# Patient Record
Sex: Male | Born: 1945
Health system: Southern US, Community
[De-identification: ages and names within clinical notes are randomized; demographics above are authoritative.]

## PROBLEM LIST (undated history)

## (undated) DIAGNOSIS — I10 Essential (primary) hypertension: Secondary | ICD-10-CM

## (undated) DIAGNOSIS — C801 Malignant (primary) neoplasm, unspecified: Secondary | ICD-10-CM

## (undated) DIAGNOSIS — K635 Polyp of colon: Secondary | ICD-10-CM

## (undated) DIAGNOSIS — D649 Anemia, unspecified: Secondary | ICD-10-CM

## (undated) DIAGNOSIS — I480 Paroxysmal atrial fibrillation: Secondary | ICD-10-CM

## (undated) DIAGNOSIS — T7840XA Allergy, unspecified, initial encounter: Secondary | ICD-10-CM

## (undated) DIAGNOSIS — M1990 Unspecified osteoarthritis, unspecified site: Secondary | ICD-10-CM

## (undated) DIAGNOSIS — N3289 Other specified disorders of bladder: Secondary | ICD-10-CM

## (undated) DIAGNOSIS — C4490 Unspecified malignant neoplasm of skin, unspecified: Secondary | ICD-10-CM

## (undated) DIAGNOSIS — J309 Allergic rhinitis, unspecified: Secondary | ICD-10-CM

## (undated) DIAGNOSIS — R3915 Urgency of urination: Secondary | ICD-10-CM

## (undated) DIAGNOSIS — H269 Unspecified cataract: Secondary | ICD-10-CM

## (undated) DIAGNOSIS — G43909 Migraine, unspecified, not intractable, without status migrainosus: Secondary | ICD-10-CM

## (undated) DIAGNOSIS — Z87442 Personal history of urinary calculi: Secondary | ICD-10-CM

## (undated) HISTORY — PX: INGUINAL HERNIA REPAIR: SUR1180

## (undated) HISTORY — DX: Unspecified malignant neoplasm of skin, unspecified: C44.90

## (undated) HISTORY — PX: CYSTOSCOPY: SUR368

## (undated) HISTORY — DX: Polyp of colon: K63.5

## (undated) HISTORY — DX: Allergy, unspecified, initial encounter: T78.40XA

## (undated) HISTORY — DX: Unspecified cataract: H26.9

## (undated) HISTORY — PX: COLONOSCOPY: SHX174

## (undated) HISTORY — PX: OTHER SURGICAL HISTORY: SHX169

## (undated) HISTORY — PX: FINGER SURGERY: SHX640

## (undated) HISTORY — DX: Essential (primary) hypertension: I10

## (undated) HISTORY — PX: APPENDECTOMY: SHX54

## (undated) HISTORY — DX: Allergic rhinitis, unspecified: J30.9

## (undated) HISTORY — DX: Paroxysmal atrial fibrillation: I48.0

## (undated) HISTORY — DX: Migraine, unspecified, not intractable, without status migrainosus: G43.909

---

## 1997-11-03 ENCOUNTER — Ambulatory Visit (HOSPITAL_COMMUNITY): Admission: RE | Admit: 1997-11-03 | Discharge: 1997-11-03 | Payer: Self-pay | Admitting: Gastroenterology

## 2000-06-11 ENCOUNTER — Emergency Department (HOSPITAL_COMMUNITY): Admission: EM | Admit: 2000-06-11 | Discharge: 2000-06-11 | Payer: Self-pay | Admitting: Emergency Medicine

## 2000-06-13 ENCOUNTER — Encounter: Payer: Self-pay | Admitting: Urology

## 2000-06-14 ENCOUNTER — Ambulatory Visit (HOSPITAL_COMMUNITY): Admission: RE | Admit: 2000-06-14 | Discharge: 2000-06-14 | Payer: Self-pay | Admitting: Urology

## 2000-06-14 ENCOUNTER — Encounter: Payer: Self-pay | Admitting: Urology

## 2001-05-23 ENCOUNTER — Other Ambulatory Visit: Admission: RE | Admit: 2001-05-23 | Discharge: 2001-05-23 | Payer: Self-pay | Admitting: Family Medicine

## 2001-06-27 ENCOUNTER — Ambulatory Visit (HOSPITAL_COMMUNITY): Admission: RE | Admit: 2001-06-27 | Discharge: 2001-06-27 | Payer: Self-pay | Admitting: General Surgery

## 2001-11-06 ENCOUNTER — Observation Stay (HOSPITAL_COMMUNITY): Admission: RE | Admit: 2001-11-06 | Discharge: 2001-11-07 | Payer: Self-pay | Admitting: General Surgery

## 2002-01-11 ENCOUNTER — Encounter: Payer: Self-pay | Admitting: Emergency Medicine

## 2002-01-11 ENCOUNTER — Inpatient Hospital Stay (HOSPITAL_COMMUNITY): Admission: EM | Admit: 2002-01-11 | Discharge: 2002-01-12 | Payer: Self-pay | Admitting: Emergency Medicine

## 2008-10-22 ENCOUNTER — Ambulatory Visit (HOSPITAL_COMMUNITY): Admission: RE | Admit: 2008-10-22 | Discharge: 2008-10-22 | Payer: Self-pay | Admitting: Internal Medicine

## 2008-10-22 ENCOUNTER — Encounter (INDEPENDENT_AMBULATORY_CARE_PROVIDER_SITE_OTHER): Payer: Self-pay | Admitting: Internal Medicine

## 2010-02-15 ENCOUNTER — Ambulatory Visit (HOSPITAL_BASED_OUTPATIENT_CLINIC_OR_DEPARTMENT_OTHER): Admission: RE | Admit: 2010-02-15 | Discharge: 2010-02-15 | Payer: Self-pay | Admitting: Urology

## 2010-06-21 LAB — POCT I-STAT 4, (NA,K, GLUC, HGB,HCT)
Glucose, Bld: 108 mg/dL — ABNORMAL HIGH (ref 70–99)
HCT: 43 % (ref 39.0–52.0)
Hemoglobin: 14.6 g/dL (ref 13.0–17.0)
Potassium: 4.3 mEq/L (ref 3.5–5.1)
Sodium: 139 mEq/L (ref 135–145)

## 2010-06-23 ENCOUNTER — Ambulatory Visit (HOSPITAL_COMMUNITY)
Admission: RE | Admit: 2010-06-23 | Discharge: 2010-06-23 | Disposition: A | Discharge status: Home / Self Care | Payer: 59 | Source: Ambulatory Visit | Attending: Urology | Admitting: Urology

## 2010-06-23 DIAGNOSIS — I10 Essential (primary) hypertension: Secondary | ICD-10-CM | POA: Insufficient documentation

## 2010-06-23 DIAGNOSIS — N200 Calculus of kidney: Secondary | ICD-10-CM | POA: Insufficient documentation

## 2010-06-23 LAB — BASIC METABOLIC PANEL
BUN: 12 mg/dL (ref 6–23)
CO2: 25 mEq/L (ref 19–32)
Chloride: 103 mEq/L (ref 96–112)
Creatinine, Ser: 0.88 mg/dL (ref 0.4–1.5)
Glucose, Bld: 116 mg/dL — ABNORMAL HIGH (ref 70–99)

## 2010-06-23 LAB — CBC
HCT: 44.8 % (ref 39.0–52.0)
MCH: 27.1 pg (ref 26.0–34.0)
MCV: 81.9 fL (ref 78.0–100.0)
Platelets: 215 10*3/uL (ref 150–400)
RBC: 5.47 MIL/uL (ref 4.22–5.81)

## 2010-08-23 NOTE — Op Note (Signed)
NAME:  MOUHAMAD, TEED NO.:  [id number]   MEDICAL RECORD NO.:  [id number]          PATIENT TYPE:  AMB   LOCATION:  DAY                           FACILITY:  [facility]   PHYSICIAN:  Lionel December, M.D.    DATE OF BIRTH:  01/24/1946   DATE OF PROCEDURE:  DATE OF DISCHARGE:  10/22/2008                               OPERATIVE REPORT   PROCEDURE:  Colonoscopy with polypectomy.   INDICATION:  Dorene Sorrow is a 65 year old Caucasian male with history of  colonic polyps whose last exam was in 2003 and was normal, but his first  exam revealed an adenoma or adenomas.  These studies were done by Dr.  Lovell Sheehan.  Family history is negative for colorectal carcinoma.  He is  undergoing surveillance colonoscopy.  Procedure risks were reviewed with  the patient, and informed consent was obtained.   MEDICATIONS FOR CONSCIOUS SEDATION:  Demerol 50 mg IV, Versed 5 mg IV.   FINDINGS:  Procedure performed in endoscopy suite.  The patient's vital  signs and O2 sat were monitored during the procedure and remained  stable.  The patient was placed in left lateral recumbent position and  rectal examination performed.  No abnormality noted on external or  digital exam.  Pentax videoscope was placed through the rectum and  advanced under vision into sigmoid colon and beyond.  Preparation was  satisfactory.  Few scattered diverticula were noted in the sigmoid  colon.  Scope was passed into cecum, which was identified by appendiceal  orifice and ileocecal valve.  Pictures were taken for the record.  As  the scope was gradually withdrawn, colonic mucosa was carefully  examined.  Two Jessie polyps were ablated via cold biopsy, one from  ascending colon and the second one was from proximal sigmoid colon,  another 6-mm flat polyp at splenic flexure was elevated with some  submucosal injection of saline and snared.  Both of the polyps were  coagulated, but part of it was retrieved for histologic examination.  There was another 5-mm polyp at midsigmoid colon which was snared and  retrieved for histologic examination.  Rectal mucosa was normal.  Scope  was retroflexed to examine anorectal junction and hemorrhoids were noted  proximal to the dentate line.  Endoscope was straightened and withdrawn.  The patient tolerated the procedure well.   Withdrawal time was 21 minutes.   FINAL DIAGNOSES:  1. Examination performed to cecum.  2. Few diverticula at sigmoid colon.  3. Two Wolk polyps ablated via cold biopsy, one from ascending colon,      second one from splenic flexure.  4. Two polyps were snared as above, flat polyp at splenic flexure was      about 6-mm and the other one was 5-mm.  5. Internal hemorrhoids.   RECOMMENDATIONS:  1. High-fiber diet.  2. No aspirin for 10 days.   I will be contacting the patient with the results of biopsy and further  recommendations.      Lionel December, M.D.  Electronically Signed     NR/MEDQ  D:  10/22/2008  T:  10/23/2008  Job:  [id number]   cc:   Donna Bernard, M.D.  Fax: [phone]

## 2010-08-26 NOTE — Op Note (Signed)
   NAME:  Joshua Alvarez, Joshua Alvarez                         ACCOUNT NO.:  [id number]   MEDICAL RECORD NO.:  [id number]                   PATIENT TYPE:  INP   LOCATION:  5019                                 FACILITY:  [facility]   PHYSICIAN:  Artist Pais. Mina Marble, M.D.           DATE OF BIRTH:  11-01-1945   DATE OF PROCEDURE:  01/11/2002  DATE OF DISCHARGE:  01/12/2002                                 OPERATIVE REPORT   PREOPERATIVE DIAGNOSIS:  Left ring finger avulsion injury.   POSTOPERATIVE DIAGNOSIS:  Left ring finger avulsion injury.   PROCEDURE:  Microscopic repair, two dorsal veins left ring finger.   SURGEON:  Artist Pais. Mina Marble, M.D.   ASSISTANT:  None.   ANESTHESIA:  General.   No tourniquet, no complications, and no drains.   DESCRIPTION OF PROCEDURE:  The patient was taken to the operating room and  after the induction of adequate general anesthesia, the left upper extremity  was prepped and draped in the usual sterile fashion.  Once this was done,  the left ring finger was inspected.  There was an obvious avulsion injury to  the area over the base of the proximal phalanx.  The initial operative  intervention included bathing the wound in a liter of warm normal saline.  Once this was done, the volar aspect was inspected.  The neurovascular  bundles were intact.  There was good blood flow into the digit with bleeding  from pinprick sites distal and proximal to the avulsion.  After the arterial  inflow had been established, following examination, the hand was fully  pronated and the microscope was brought into the field.  Two dorsal veins  were repaired using 10-0 nylon.  Once this was done, the wound was again  thoroughly irrigated.  There appeared to be good flow in and out of the  finger.  There was some veins in the radial and ulnar side that appeared to  be intact and again after this was done, it was irrigated with warm solution  and the avulsion skin was loosely closed  circumferentially using 5-0 nylon  in simple sutures spaced broadly apart.  The patient was then placed in a  sterile dressing, Xeroform, 4x4s, fluffs, and a dorsal extension block  splint with slight flexion to the wrist and fingers.  The patient had a good  capillary refill postoperatively and was taken to the PACU in stable  condition.                                               Artist Pais Mina Marble, M.D.    MAW/MEDQ  D:  01/11/2002  T:  01/13/2002  Job:  [id number]

## 2010-08-26 NOTE — Op Note (Signed)
NAME:  Joshua Alvarez, Joshua Alvarez NO.:  [id number]   MEDICAL RECORD NO.:  [id number]                   PATIENT TYPE:  AMB   LOCATION:  DAY                                  FACILITY:  [facility]   PHYSICIAN:  Marlane Hatcher, M.D.           DATE OF BIRTH:  1945-11-03   DATE OF PROCEDURE:  11/06/2001  DATE OF DISCHARGE:                                 OPERATIVE REPORT   PREOPERATIVE DIAGNOSIS:  Left inguinal hernia.   POSTOPERATIVE DIAGNOSIS:  Left inguinal hernia, direct and indirect.   PROCEDURE:  Left inguinal herniorrhaphy with mesh plug and patch repair.   SURGEON:  Marlane Hatcher, M.D.   INDICATIONS FOR PROCEDURE:  This is a 65 year old white male who had a  reducible right groin mass for at least five years.  This was clinically  consistent with a left inguinal hernia.  We had made plans for an elective  repair, discussing the possible use of mesh and discussing complications not  limited to, but including, bleeding, infection, and recurrence.  Informed  consent was obtained.   GROSS FINDINGS:  The patient had considerable amount of scar tissue around  the large indirect inguinal hernia sac.  We performed a high ligation of the  sac as well as a repair of the inguinal floor as the inguinal floor itself  was weak.   TECHNIQUE:  The patient was placed in the supine position after the adequate  administration of general anesthesia via LMA anesthesia.  The patient's  abdomen was prepped with Betadine solution, draped in the usual manner.  Prior to this, a Foley catheter was aseptically inserted.  An incision was  carried out between the anterior superior iliac spine on the left and the  pubic tubercle through skin, subcutaneous tissue, and Scarpa's layer down to  the external oblique, which was open through the external ring.  The cord  structures were dissected free from the indirect inguinal hernia sac which  was open under direct vision and doubly  ligated with 2-0 Bralon suture.  The  redundant portion of the sac was amputated.  I then sutured to the sac a  remnant, a medium-sized plug and this was sutured peripherally to the fascia  of the transversalis abdominus and the transverses abdominus fascia in order  to hold this in place.  I then laid a plug underneath the cord structures  and this was cut to accommodate the inguinal floor.  This was likewise  tacked to the transversalis abdominis and the transversus fascias as well as  Poupart's ligament with interrupted 2-0 Novofil sutures.  The cord  structures were then returned to their anatomic position.  The wound was  irrigated with bacitracin and saline solution which had been the same  solution that we had used to soak the mesh prosthesis preoperatively.  We  then irrigated and the closed the subcutaneous layer with 2-0 Polysorb and  the skin with  a stapling device.  Prior to closure, all sponge, instrument,  and needle counts found to be correct.  Estimated blood loss was minimal.  The patient received 1500 cc of crystalloid intraoperatively.  There were no  complications.   ADDENDUM:  We did use approximately 6 cc of 0.5% Cetacaine to locally  infiltrate the area to add with postoperative comfort.                                                   Marlane Hatcher, M.D.    WSB/MEDQ  D:  11/06/2001  T:  11/09/2001  Job:  [id number]   cc:   Lubertha South, M.D.

## 2010-08-26 NOTE — Op Note (Signed)
[place]. [facility]  Patient:    Joshua Alvarez, Joshua Alvarez                      MRN: [id number] Proc. Date: 06/14/00 Adm. Date:  [id number] Attending:  Monica Becton CC:         Cecil Cranker, M.D. [facility]   Operative Report  PREOPERATIVE DIAGNOSIS:  Large obstructing stone in the mid left ureter.  POSTOPERATIVE DIAGNOSIS:  Large obstructing stone in the mid left ureter.  PROCEDURE:  Cystoscopy, left retrograde pyeloureterogram and insertion of a 6 French 26 cm double-J stent.  BRIEF HISTORY:  This is a 65 year old man who presented with left-sided colicky pain over the weekend, and CT scan showed about 1 cm stone at the junction of the mid and distal ureter right over the sacrum.  We saw him in the office a couple of days ago and we could see the stone overlying the sacrum only on the oblique view.  We went over treatment options including an attempt at ESWL versus ureteroscopy.  A decision was made to go ahead with a ESWL and I thought that we ought put in a stent ahead of time for localization purposes.  The patient agrees to the proposed surgery.  PROCEDURE:  The patient was prepped and draped in the dorsal lithotomy position under LMA anesthesia. The cystoscopy was performed with a 22 French cystoscope.  There was no urethral stricture.  He had a [place] nonobstructing prostate, a wide open bladder neck.  The bladder itself looked normal.  No tumors, no calculi.  Normal ureteral orifices.  Initially I passed up a 0.038 removal coil guidewire through a 6 [place] opened ended ureteral catheter with C-arm control.  Retrograde studies were performed showing a large left hydroureteral nephrosis.  We then passed up the guidewire and past the stone without much difficulty. It was positioned in the left renal pelvis.  I backed out the 6 french opened ended catheter and I passed up a 6 french 26 cm bard inlay catheter without much difficulty.  It was positioned  in the left renal pelvis.  The distal end was curled up in the bladder. The guidewire was removed.  The bladder was emptied.  I injected 10 cc of Xylocaine jelly up the urethra and he was taken back to the recovery room in satisfactory condition.  The plan now is to perform ESWL later on in the morning. DD:  06/14/00 TD:  06/14/00 Job: [id number]

## 2010-08-26 NOTE — Consult Note (Signed)
NAME:  Joshua Alvarez, Joshua Alvarez                         ACCOUNT NO.:  [id number]   MEDICAL RECORD NO.:  [id number]                   PATIENT TYPE:  EMS   LOCATION:  ED                                   FACILITY:  [facility]   PHYSICIAN:  Artist Pais. Mina Marble, M.D.           DATE OF BIRTH:  1945/12/22   DATE OF CONSULTATION:  01/11/2002  DATE OF DISCHARGE:  01/11/2002                                   CONSULTATION   REASON FOR CONSULTATION:  Joshua Alvarez is a very pleasant, 65 year old, right-  hand-dominant male, who fell off a ladder earlier today and sustained an  evulsion-type injury to his left ring finger, where his wedding band got  caught on one of the rungs.  He was initially seen at [facility] by  Dr. Margarita Grizzle, and transferred here for definitive care.  He is an  otherwise healthy 65 year old male with no known drug allergies.  He is  currently taking atenolol, Lasix, and Altace.   PAST MEDICAL HISTORY:  He had a hernia repair two months ago and no recent  hospitalizations or surgery.   FAMILY HISTORY:  Otherwise noncontributory.   SOCIAL HISTORY:  Noncontributory.   PHYSICAL EXAMINATION:  GENERAL:  Well-developed, well-nourished male,  pleasant, alert and oriented x 3.  LEFT RING FINGER:  He has an obvious evulsion-type injury in the proximal  third of the proximal phalanx with what appears to be a too pink turgor and  venous congestion and decreased capillary refill.  The digit distal to the  ring evulsion is somewhat congested.  He is able to flex at the PIP and DIP,  although it is limited, and there is evidence of, again, venous congestion  and decreased inflow to the digit.  X-ray shows some fragments from his  ring.  No fracture is identified.   IMPRESSION:  A 65 year old male with what appears to be a significant  evulsion-type injury to his nondominant left ring finger.  I explained to  Joshua Alvarez and his wife that we needed to take him to the operating room  immediately, explore this with possible vein grafting if necessary versus  heparinization and dextran if the vessels are intact but in spasm.  We will  go ahead and look at this under the microscope and repair as necessary.  Also explained to both Joshua Alvarez and his wife the significant risk of the  finger being nonviable secondary to the evulsion and that certainly we would  try all attempts, but there is a significant risk of losing the finger  secondary to his injury.  They understand the risks and benefits and wish to  proceed with attempted revascularization, are going to be set up as soon as  possible, again for the previously-mentioned procedure.  Artist Pais Mina Marble, M.D.    MAW/MEDQ  D:  01/11/2002  T:  01/14/2002  Job:  [id number]

## 2011-08-30 DIAGNOSIS — J019 Acute sinusitis, unspecified: Secondary | ICD-10-CM | POA: Diagnosis not present

## 2011-08-30 DIAGNOSIS — B9789 Other viral agents as the cause of diseases classified elsewhere: Secondary | ICD-10-CM | POA: Diagnosis not present

## 2011-09-06 DIAGNOSIS — J019 Acute sinusitis, unspecified: Secondary | ICD-10-CM | POA: Diagnosis not present

## 2011-09-06 DIAGNOSIS — Z79899 Other long term (current) drug therapy: Secondary | ICD-10-CM | POA: Diagnosis not present

## 2011-09-06 DIAGNOSIS — J42 Unspecified chronic bronchitis: Secondary | ICD-10-CM | POA: Diagnosis not present

## 2011-09-06 DIAGNOSIS — J988 Other specified respiratory disorders: Secondary | ICD-10-CM | POA: Diagnosis not present

## 2011-09-06 DIAGNOSIS — E785 Hyperlipidemia, unspecified: Secondary | ICD-10-CM | POA: Diagnosis not present

## 2011-09-06 DIAGNOSIS — Z125 Encounter for screening for malignant neoplasm of prostate: Secondary | ICD-10-CM | POA: Diagnosis not present

## 2011-09-22 ENCOUNTER — Other Ambulatory Visit: Payer: Self-pay | Admitting: Family Medicine

## 2011-09-22 DIAGNOSIS — I714 Abdominal aortic aneurysm, without rupture: Secondary | ICD-10-CM

## 2011-09-22 DIAGNOSIS — Z0000 Encounter for general adult medical examination without abnormal findings: Secondary | ICD-10-CM | POA: Diagnosis not present

## 2011-09-22 DIAGNOSIS — Z1211 Encounter for screening for malignant neoplasm of colon: Secondary | ICD-10-CM | POA: Diagnosis not present

## 2011-09-28 ENCOUNTER — Ambulatory Visit (HOSPITAL_COMMUNITY)
Admission: RE | Admit: 2011-09-28 | Discharge: 2011-09-28 | Disposition: A | Discharge status: Home / Self Care | Payer: Medicare Other | Source: Ambulatory Visit | Attending: Family Medicine | Admitting: Family Medicine

## 2011-09-28 DIAGNOSIS — I714 Abdominal aortic aneurysm, without rupture, unspecified: Secondary | ICD-10-CM | POA: Insufficient documentation

## 2011-09-28 DIAGNOSIS — Z136 Encounter for screening for cardiovascular disorders: Secondary | ICD-10-CM | POA: Diagnosis not present

## 2011-09-28 DIAGNOSIS — I700 Atherosclerosis of aorta: Secondary | ICD-10-CM | POA: Diagnosis not present

## 2011-11-02 DIAGNOSIS — J42 Unspecified chronic bronchitis: Secondary | ICD-10-CM | POA: Diagnosis not present

## 2011-11-02 DIAGNOSIS — J019 Acute sinusitis, unspecified: Secondary | ICD-10-CM | POA: Diagnosis not present

## 2011-11-10 DIAGNOSIS — J42 Unspecified chronic bronchitis: Secondary | ICD-10-CM | POA: Diagnosis not present

## 2011-11-10 DIAGNOSIS — J019 Acute sinusitis, unspecified: Secondary | ICD-10-CM | POA: Diagnosis not present

## 2012-02-29 ENCOUNTER — Other Ambulatory Visit: Payer: Self-pay | Admitting: Dermatology

## 2012-02-29 DIAGNOSIS — D485 Neoplasm of uncertain behavior of skin: Secondary | ICD-10-CM | POA: Diagnosis not present

## 2012-02-29 DIAGNOSIS — D235 Other benign neoplasm of skin of trunk: Secondary | ICD-10-CM | POA: Diagnosis not present

## 2012-04-05 DIAGNOSIS — J0110 Acute frontal sinusitis, unspecified: Secondary | ICD-10-CM | POA: Diagnosis not present

## 2012-04-05 DIAGNOSIS — J209 Acute bronchitis, unspecified: Secondary | ICD-10-CM | POA: Diagnosis not present

## 2012-05-27 DIAGNOSIS — H2510 Age-related nuclear cataract, unspecified eye: Secondary | ICD-10-CM | POA: Diagnosis not present

## 2012-07-10 ENCOUNTER — Other Ambulatory Visit: Payer: Self-pay | Admitting: Dermatology

## 2012-07-10 DIAGNOSIS — L570 Actinic keratosis: Secondary | ICD-10-CM | POA: Diagnosis not present

## 2012-07-10 DIAGNOSIS — L821 Other seborrheic keratosis: Secondary | ICD-10-CM | POA: Diagnosis not present

## 2012-07-10 DIAGNOSIS — C44611 Basal cell carcinoma of skin of unspecified upper limb, including shoulder: Secondary | ICD-10-CM | POA: Diagnosis not present

## 2012-07-10 DIAGNOSIS — D485 Neoplasm of uncertain behavior of skin: Secondary | ICD-10-CM | POA: Diagnosis not present

## 2012-07-25 DIAGNOSIS — L679 Hair color and hair shaft abnormality, unspecified: Secondary | ICD-10-CM | POA: Diagnosis not present

## 2012-07-25 DIAGNOSIS — C44611 Basal cell carcinoma of skin of unspecified upper limb, including shoulder: Secondary | ICD-10-CM | POA: Diagnosis not present

## 2012-08-07 ENCOUNTER — Ambulatory Visit (INDEPENDENT_AMBULATORY_CARE_PROVIDER_SITE_OTHER): Payer: Medicare Other | Admitting: Family Medicine

## 2012-08-07 ENCOUNTER — Encounter: Payer: Self-pay | Admitting: Family Medicine

## 2012-08-07 VITALS — BP 119/72 | Temp 97.6°F | Ht 68.0 in | Wt 180.4 lb

## 2012-08-07 DIAGNOSIS — J322 Chronic ethmoidal sinusitis: Secondary | ICD-10-CM

## 2012-08-07 MED ORDER — HYDROCODONE-HOMATROPINE 5-1.5 MG/5ML PO SYRP: ORAL_SOLUTION | ORAL | Status: AC

## 2012-08-07 MED ORDER — AMOXICILLIN 500 MG PO TABS: ORAL_TABLET | ORAL | Status: DC

## 2012-08-07 NOTE — Progress Notes (Signed)
  Subjective:    Patient ID: Joshua Alvarez, male    DOB: Aug 16, 1945, 67 y.o.   MRN: [id number]  Cough This is a new problem. The current episode started in the past 7 days. The problem has been gradually worsening. The problem occurs every few minutes. The cough is non-productive. Associated symptoms include chills and headaches (frontal). Pertinent negatives include no fever. Nothing aggravates the symptoms. He has tried nothing for the symptoms. The treatment provided no relief.   Headache frontal in nature   Review of Systems  Constitutional: Positive for chills. Negative for fever.  Respiratory: Positive for cough.   Neurological: Positive for headaches (frontal).   No gi sympt otherwise neg    Objective:   Physical Exam  Alert mild malaise. Vitals reviewed. Frontal and nasal congestion. Pharynx erythematous. Neck supple. Lungs clear. Heart regular rate and rhythm.      Assessment & Plan:  Impression sinusitis. Plan antibiotics prescribed. Symptomatic care discussed.

## 2012-08-23 ENCOUNTER — Other Ambulatory Visit: Payer: Self-pay | Admitting: Family Medicine

## 2012-09-19 ENCOUNTER — Other Ambulatory Visit: Payer: Self-pay | Admitting: Family Medicine

## 2012-10-30 ENCOUNTER — Telehealth: Payer: Self-pay | Admitting: Family Medicine

## 2012-10-30 NOTE — Telephone Encounter (Signed)
Patient needs [facility] paperwork for Physical next month

## 2012-10-30 NOTE — Telephone Encounter (Signed)
Lip liv met psa 

## 2012-10-31 ENCOUNTER — Other Ambulatory Visit: Payer: Self-pay

## 2012-10-31 DIAGNOSIS — E782 Mixed hyperlipidemia: Secondary | ICD-10-CM

## 2012-10-31 DIAGNOSIS — Z79899 Other long term (current) drug therapy: Secondary | ICD-10-CM

## 2012-10-31 DIAGNOSIS — Z125 Encounter for screening for malignant neoplasm of prostate: Secondary | ICD-10-CM

## 2012-10-31 NOTE — Telephone Encounter (Signed)
Patient notified labwork is ready for pickup.

## 2012-11-05 ENCOUNTER — Other Ambulatory Visit: Payer: Self-pay

## 2012-11-05 MED ORDER — ENALAPRIL MALEATE 10 MG PO TABS: ORAL_TABLET | ORAL | Status: DC

## 2012-11-07 DIAGNOSIS — Z79899 Other long term (current) drug therapy: Secondary | ICD-10-CM | POA: Diagnosis not present

## 2012-11-07 DIAGNOSIS — Z125 Encounter for screening for malignant neoplasm of prostate: Secondary | ICD-10-CM | POA: Diagnosis not present

## 2012-11-07 DIAGNOSIS — E782 Mixed hyperlipidemia: Secondary | ICD-10-CM | POA: Diagnosis not present

## 2012-11-07 LAB — LIPID PANEL
LDL Cholesterol: 70 mg/dL (ref 0–99)
VLDL: 18 mg/dL (ref 0–40)

## 2012-11-07 LAB — BASIC METABOLIC PANEL
BUN: 11 mg/dL (ref 6–23)
Chloride: 104 mEq/L (ref 96–112)
Glucose, Bld: 107 mg/dL — ABNORMAL HIGH (ref 70–99)
Potassium: 5 mEq/L (ref 3.5–5.3)
Sodium: 138 mEq/L (ref 135–145)

## 2012-11-07 LAB — HEPATIC FUNCTION PANEL
Bilirubin, Direct: 0.1 mg/dL (ref 0.0–0.3)
Indirect Bilirubin: 0.5 mg/dL (ref 0.0–0.9)
Total Bilirubin: 0.6 mg/dL (ref 0.3–1.2)

## 2012-11-13 ENCOUNTER — Encounter: Payer: Self-pay | Admitting: *Deleted

## 2012-11-19 ENCOUNTER — Encounter: Payer: Self-pay | Admitting: Family Medicine

## 2012-11-19 ENCOUNTER — Ambulatory Visit (INDEPENDENT_AMBULATORY_CARE_PROVIDER_SITE_OTHER): Payer: Medicare Other | Admitting: Family Medicine

## 2012-11-19 VITALS — BP 126/84 | Ht 68.0 in | Wt 178.0 lb

## 2012-11-19 DIAGNOSIS — E785 Hyperlipidemia, unspecified: Secondary | ICD-10-CM | POA: Diagnosis not present

## 2012-11-19 DIAGNOSIS — Z0000 Encounter for general adult medical examination without abnormal findings: Secondary | ICD-10-CM | POA: Diagnosis not present

## 2012-11-19 DIAGNOSIS — I10 Essential (primary) hypertension: Secondary | ICD-10-CM | POA: Insufficient documentation

## 2012-11-19 DIAGNOSIS — N400 Enlarged prostate without lower urinary tract symptoms: Secondary | ICD-10-CM | POA: Insufficient documentation

## 2012-11-19 MED ORDER — SILDENAFIL CITRATE 50 MG PO TABS: ORAL_TABLET | ORAL | Status: DC

## 2012-11-19 NOTE — Progress Notes (Signed)
Subjective:    Patient ID: Joshua Alvarez, male    DOB: 09/30/1945, 67 y.o.   MRN: [id number]  HPI  Results for orders placed in visit on 10/31/12  LIPID PANEL      Result Value Range   Cholesterol 132  0 - 200 mg/dL   Triglycerides 88  <657 mg/dL   HDL 44  >84 mg/dL   Total CHOL/HDL Ratio 3.0     VLDL 18  0 - 40 mg/dL   LDL Cholesterol 70  0 - 99 mg/dL  HEPATIC FUNCTION PANEL      Result Value Range   Total Bilirubin 0.6  0.3 - 1.2 mg/dL   Bilirubin, Direct 0.1  0.0 - 0.3 mg/dL   Indirect Bilirubin 0.5  0.0 - 0.9 mg/dL   Alkaline Phosphatase 77  39 - 117 U/L   AST 19  0 - 37 U/L   ALT 19  0 - 53 U/L   Total Protein 6.5  6.0 - 8.3 g/dL   Albumin 4.2  3.5 - 5.2 g/dL  BASIC METABOLIC PANEL      Result Value Range   Sodium 138  135 - 145 mEq/L   Potassium 5.0  3.5 - 5.3 mEq/L   Chloride 104  96 - 112 mEq/L   CO2 29  19 - 32 mEq/L   Glucose, Bld 107 (*) 70 - 99 mg/dL   BUN 11  6 - 23 mg/dL   Creat 6.96  2.95 - 2.84 mg/dL   Calcium 9.3  8.4 - 13.2 mg/dL  PSA, MEDICARE      Result Value Range   PSA 2.94  <=4.00 ng/mL   Tired and fatigued. States and not depressed. Exercises very regularly.  Compliant with all of his medicines. Trying to watch his diet.  Wonders if his fatigue is related to the Lipitor.  Notes difficulty with erections lately. Worse over the past year. Would like to try something.  Review of Systems  Constitutional: Negative for fever, activity change and appetite change.       Positive for fatigue  HENT: Negative for congestion, rhinorrhea and neck pain.   Eyes: Negative for discharge.  Respiratory: Negative for cough and wheezing.   Cardiovascular: Negative for chest pain.  Gastrointestinal: Negative for vomiting, abdominal pain and blood in stool.  Genitourinary: Negative for frequency and difficulty urinating.       Progressive erectile dysfunction  Skin: Negative for rash.  Allergic/Immunologic: Negative for environmental allergies and food  allergies.  Neurological: Negative for weakness and headaches.  Psychiatric/Behavioral: Negative for agitation.       Objective:   Physical Exam  Vitals reviewed. Constitutional: He appears well-developed and well-nourished.  HENT:  Head: Normocephalic and atraumatic.  Right Ear: External ear normal.  Left Ear: External ear normal.  Nose: Nose normal.  Mouth/Throat: Oropharynx is clear and moist.  Eyes: EOM are normal. Pupils are equal, round, and reactive to light.  Neck: Normal range of motion. Neck supple. No thyromegaly present.  Cardiovascular: Normal rate, regular rhythm and normal heart sounds.   No murmur heard. Pulmonary/Chest: Effort normal and breath sounds normal. No respiratory distress. He has no wheezes.  Abdominal: Soft. Bowel sounds are normal. He exhibits no distension and no mass. There is no tenderness.  Genitourinary: Penis normal.  Musculoskeletal: Normal range of motion. He exhibits no edema.  Lymphadenopathy:    He has no cervical adenopathy.  Neurological: He is alert. He exhibits normal muscle tone.  Skin: Skin  is warm and dry. No erythema.  Psychiatric: He has a normal mood and affect. His behavior is normal. Judgment normal.          Assessment & Plan:  Impression #1 wellness exam #2 hypertension good control #3 erectile dysfunction discussed. 4 hyperlipidemia good control though question related to #5 #5 fatigue plan trial off Lipitor for the next month. Viagra 50 mg #6 appropriate use discussed. Otherwise check in 6 months. WSL

## 2012-11-19 NOTE — Patient Instructions (Signed)
Stop the lipitor for one month. If fatigue doesn't change, start back at full dose.  If fatigue improves, cut the dose in half and try that.  If fatigue does not change, also try hard to increase your exercise level

## 2012-12-06 ENCOUNTER — Other Ambulatory Visit: Payer: Self-pay | Admitting: Family Medicine

## 2012-12-17 ENCOUNTER — Other Ambulatory Visit: Payer: Self-pay | Admitting: Family Medicine

## 2013-01-08 ENCOUNTER — Other Ambulatory Visit: Payer: Self-pay | Admitting: Family Medicine

## 2013-02-25 ENCOUNTER — Other Ambulatory Visit: Payer: Self-pay | Admitting: Family Medicine

## 2013-04-25 ENCOUNTER — Telehealth: Payer: Self-pay | Admitting: Family Medicine

## 2013-04-25 ENCOUNTER — Other Ambulatory Visit: Payer: Self-pay | Admitting: Family Medicine

## 2013-04-25 ENCOUNTER — Other Ambulatory Visit: Payer: Self-pay | Admitting: *Deleted

## 2013-04-25 DIAGNOSIS — Z79899 Other long term (current) drug therapy: Secondary | ICD-10-CM

## 2013-04-25 DIAGNOSIS — I10 Essential (primary) hypertension: Secondary | ICD-10-CM

## 2013-04-25 DIAGNOSIS — E785 Hyperlipidemia, unspecified: Secondary | ICD-10-CM

## 2013-04-25 NOTE — Telephone Encounter (Signed)
Does patient needs blood work order for visit in February?

## 2013-04-25 NOTE — Telephone Encounter (Signed)
Had lipid, liver, bmp, psa on 11/07/12

## 2013-04-25 NOTE — Telephone Encounter (Signed)
Patient notified

## 2013-04-25 NOTE — Telephone Encounter (Signed)
Lip liv 

## 2013-05-06 DIAGNOSIS — R3915 Urgency of urination: Secondary | ICD-10-CM | POA: Diagnosis not present

## 2013-05-06 DIAGNOSIS — N200 Calculus of kidney: Secondary | ICD-10-CM | POA: Diagnosis not present

## 2013-05-06 DIAGNOSIS — N400 Enlarged prostate without lower urinary tract symptoms: Secondary | ICD-10-CM | POA: Diagnosis not present

## 2013-05-06 DIAGNOSIS — R350 Frequency of micturition: Secondary | ICD-10-CM | POA: Diagnosis not present

## 2013-05-13 DIAGNOSIS — E785 Hyperlipidemia, unspecified: Secondary | ICD-10-CM | POA: Diagnosis not present

## 2013-05-13 DIAGNOSIS — Z79899 Other long term (current) drug therapy: Secondary | ICD-10-CM | POA: Diagnosis not present

## 2013-05-13 LAB — HEPATIC FUNCTION PANEL
ALT: 18 U/L (ref 0–53)
AST: 17 U/L (ref 0–37)
Albumin: 4.3 g/dL (ref 3.5–5.2)
Alkaline Phosphatase: 78 U/L (ref 39–117)
BILIRUBIN DIRECT: 0.1 mg/dL (ref 0.0–0.3)
BILIRUBIN TOTAL: 0.6 mg/dL (ref 0.2–1.2)
Indirect Bilirubin: 0.5 mg/dL (ref 0.2–1.2)
Total Protein: 6.5 g/dL (ref 6.0–8.3)

## 2013-05-13 LAB — LIPID PANEL
CHOLESTEROL: 158 mg/dL (ref 0–200)
HDL: 43 mg/dL (ref >39)
LDL Cholesterol: 84 mg/dL (ref 0–99)
TRIGLYCERIDES: 157 mg/dL — AB (ref <150)
Total CHOL/HDL Ratio: 3.7 Ratio
VLDL: 31 mg/dL (ref 0–40)

## 2013-05-21 ENCOUNTER — Ambulatory Visit (INDEPENDENT_AMBULATORY_CARE_PROVIDER_SITE_OTHER): Payer: Medicare Other | Admitting: Family Medicine

## 2013-05-21 ENCOUNTER — Encounter: Payer: Self-pay | Admitting: Family Medicine

## 2013-05-21 VITALS — BP 118/82 | Ht 68.0 in | Wt 184.2 lb

## 2013-05-21 DIAGNOSIS — N400 Enlarged prostate without lower urinary tract symptoms: Secondary | ICD-10-CM

## 2013-05-21 DIAGNOSIS — I10 Essential (primary) hypertension: Secondary | ICD-10-CM | POA: Diagnosis not present

## 2013-05-21 DIAGNOSIS — E785 Hyperlipidemia, unspecified: Secondary | ICD-10-CM

## 2013-05-21 MED ORDER — ETODOLAC 400 MG PO TABS: 400.0000 mg | ORAL_TABLET | Freq: Two times a day (BID) | ORAL | Status: DC

## 2013-05-21 NOTE — Progress Notes (Signed)
   Subjective:    Patient ID: Joshua Alvarez, male    DOB: May 29, 1945, 68 y.o.   MRN: [id number]  HPI  Patient arrives office for several concerns.  History of high blood pressure. Claims compliance with medication. Trying to watch his diet. Has not down her salt intake. Numbers are good when checked elsewhere.  States he did not get the Viagra filled out please see prior notes.  Claims compliance with her lipid medicine. Has cut that down his diet. Exercising 3-4 times a week including cardio.  Notes shoulder pain bilateral and elbow pain bilateral. Still does weight lifting at times. A lot of shoulder and arm were down to the years with his job. Now still with his exercise level. Results for orders placed in visit on 04/25/13  HEPATIC FUNCTION PANEL      Result Value Ref Range   Total Bilirubin 0.6  0.2 - 1.2 mg/dL   Bilirubin, Direct 0.1  0.0 - 0.3 mg/dL   Indirect Bilirubin 0.5  0.2 - 1.2 mg/dL   Alkaline Phosphatase 78  39 - 117 U/L   AST 17  0 - 37 U/L   ALT 18  0 - 53 U/L   Total Protein 6.5  6.0 - 8.3 g/dL   Albumin 4.3  3.5 - 5.2 g/dL  LIPID PANEL      Result Value Ref Range   Cholesterol 158  0 - 200 mg/dL   Triglycerides 157 (*) <150 mg/dL   HDL 43  >39 mg/dL   Total CHOL/HDL Ratio 3.7     VLDL 31  0 - 40 mg/dL   LDL Cholesterol 84  0 - 99 mg/dL    Review of Systems No headache or chest pain no back pain no abdominal pain no change in bowel habits no blood in stool urinating overall still improved. ROS otherwise negative per    Objective:   Physical Exam  Alert slight malaise. Vital stable. Blood pressure good on repeat. HEENT normal. Lungs clear. Heart regular in rhythm. Shoulder some mild impingement sign noted. Elbows positive lateral epicondyle tender in nature ankles without edema.      Assessment & Plan:  Impression 1 lateral epicondylitis discussed bilateral. #2 chronic shoulder pain with acute flare likely component from rotator cuff #3 hypertension  good control. #4 hyperlipidemia good control. Plan trial of Lodine 400 mg 1 by mouth twice a day with food when necessary for pain. Maintain same medications. Recheck in 6 months. Diet exercise discussed in encourage. WSL

## 2013-05-27 DIAGNOSIS — D239 Other benign neoplasm of skin, unspecified: Secondary | ICD-10-CM | POA: Diagnosis not present

## 2013-05-27 DIAGNOSIS — D485 Neoplasm of uncertain behavior of skin: Secondary | ICD-10-CM | POA: Diagnosis not present

## 2013-05-27 DIAGNOSIS — L570 Actinic keratosis: Secondary | ICD-10-CM | POA: Diagnosis not present

## 2013-05-27 DIAGNOSIS — Z85828 Personal history of other malignant neoplasm of skin: Secondary | ICD-10-CM | POA: Diagnosis not present

## 2013-05-28 DIAGNOSIS — C44319 Basal cell carcinoma of skin of other parts of face: Secondary | ICD-10-CM | POA: Diagnosis not present

## 2013-06-09 ENCOUNTER — Ambulatory Visit (INDEPENDENT_AMBULATORY_CARE_PROVIDER_SITE_OTHER): Payer: Medicare Other | Admitting: Family Medicine

## 2013-06-09 ENCOUNTER — Encounter: Payer: Self-pay | Admitting: Family Medicine

## 2013-06-09 VITALS — BP 100/76 | Temp 98.3°F | Ht 68.0 in | Wt 182.1 lb

## 2013-06-09 DIAGNOSIS — J45909 Unspecified asthma, uncomplicated: Secondary | ICD-10-CM

## 2013-06-09 DIAGNOSIS — J329 Chronic sinusitis, unspecified: Secondary | ICD-10-CM | POA: Diagnosis not present

## 2013-06-09 DIAGNOSIS — J683 Other acute and subacute respiratory conditions due to chemicals, gases, fumes and vapors: Secondary | ICD-10-CM

## 2013-06-09 DIAGNOSIS — N200 Calculus of kidney: Secondary | ICD-10-CM | POA: Diagnosis not present

## 2013-06-09 MED ORDER — AMOXICILLIN-POT CLAVULANATE 875-125 MG PO TABS: 1.0000 | ORAL_TABLET | Freq: Two times a day (BID) | ORAL | Status: DC

## 2013-06-09 MED ORDER — ALBUTEROL SULFATE HFA 108 (90 BASE) MCG/ACT IN AERS: 2.0000 | INHALATION_SPRAY | Freq: Four times a day (QID) | RESPIRATORY_TRACT | Status: DC | PRN

## 2013-06-09 NOTE — Progress Notes (Signed)
   Subjective:    Patient ID: Joshua Alvarez, male    DOB: 1945-09-24, 68 y.o.   MRN: [id number]  Sinusitis This is a new problem. The current episode started in the past 7 days. The problem has been gradually worsening since onset. There has been no fever. Associated symptoms include congestion, coughing, ear pain and sinus pressure. (Body aches) Past treatments include acetaminophen. The treatment provided no relief.    Some exposure ot sick grand kids  Possible low grade fever,  Sig pressure frontal aching  Cough off and on, trouble sleeping at night  Prod phlegm, yell and gunky  Tylenol sinus prn cong and ha,   Review of Systems  HENT: Positive for congestion, ear pain and sinus pressure.   Respiratory: Positive for cough.        Objective:   Physical Exam  Alert mild malaise. HEENT moderate his congestion frontal tenderness. Intermittent cough during exam neck supple. Lungs rare expiratory wheeze no crackles no tachypnea heart regular in rhythm.     Assessment & Plan:  Impression rhinosinusitis bronchitis with reactive airways plan albuterol 2 sprays 4 times a day. Augmentin twice a day 10 days. Since Medicare discussed. WSL

## 2013-06-13 ENCOUNTER — Telehealth: Payer: Self-pay | Admitting: Family Medicine

## 2013-06-13 MED ORDER — LEVOFLOXACIN 500 MG PO TABS: 500.0000 mg | ORAL_TABLET | Freq: Every day | ORAL | Status: AC

## 2013-06-13 NOTE — Telephone Encounter (Signed)
Patient states his symptoms are the same. He still has congestion, cough, no fever and shortness of breath.

## 2013-06-13 NOTE — Telephone Encounter (Signed)
Patient is still having congestion, sinus issues and a bad cough. He would like something else called in.   [place]

## 2013-06-13 NOTE — Telephone Encounter (Signed)
Last seen 06/09/13

## 2013-06-13 NOTE — Telephone Encounter (Signed)
Nurse to call-I am not opposed to trying a different antibiotic but he just started on this medicine just a few days ago. Please ask a busy running any fevers? Does he feel worse? Increased pain? Houses breathing in his lungs?

## 2013-06-13 NOTE — Telephone Encounter (Signed)
Patient notified and verbalized understanding. 

## 2013-06-13 NOTE — Telephone Encounter (Signed)
May stop Augmentin use Levaquin 500 milligram 1 daily for 7 days. If ongoing trouble he needs followup

## 2013-06-23 ENCOUNTER — Other Ambulatory Visit: Payer: Self-pay | Admitting: Family Medicine

## 2013-06-25 DIAGNOSIS — H2510 Age-related nuclear cataract, unspecified eye: Secondary | ICD-10-CM | POA: Diagnosis not present

## 2013-06-25 DIAGNOSIS — H524 Presbyopia: Secondary | ICD-10-CM | POA: Diagnosis not present

## 2013-06-25 DIAGNOSIS — H5210 Myopia, unspecified eye: Secondary | ICD-10-CM | POA: Diagnosis not present

## 2013-06-25 DIAGNOSIS — H52229 Regular astigmatism, unspecified eye: Secondary | ICD-10-CM | POA: Diagnosis not present

## 2013-06-30 ENCOUNTER — Other Ambulatory Visit: Payer: Self-pay | Admitting: Family Medicine

## 2013-07-04 DIAGNOSIS — C44319 Basal cell carcinoma of skin of other parts of face: Secondary | ICD-10-CM | POA: Diagnosis not present

## 2013-07-15 ENCOUNTER — Other Ambulatory Visit: Payer: Self-pay | Admitting: Family Medicine

## 2013-07-18 DIAGNOSIS — R350 Frequency of micturition: Secondary | ICD-10-CM | POA: Diagnosis not present

## 2013-07-18 DIAGNOSIS — R3915 Urgency of urination: Secondary | ICD-10-CM | POA: Diagnosis not present

## 2013-07-18 DIAGNOSIS — N200 Calculus of kidney: Secondary | ICD-10-CM | POA: Diagnosis not present

## 2013-07-18 DIAGNOSIS — N400 Enlarged prostate without lower urinary tract symptoms: Secondary | ICD-10-CM | POA: Diagnosis not present

## 2013-07-29 ENCOUNTER — Other Ambulatory Visit: Payer: Self-pay | Admitting: Family Medicine

## 2013-09-18 DIAGNOSIS — R3915 Urgency of urination: Secondary | ICD-10-CM | POA: Diagnosis not present

## 2013-09-18 DIAGNOSIS — R350 Frequency of micturition: Secondary | ICD-10-CM | POA: Diagnosis not present

## 2013-09-18 DIAGNOSIS — N401 Enlarged prostate with lower urinary tract symptoms: Secondary | ICD-10-CM | POA: Diagnosis not present

## 2013-09-18 DIAGNOSIS — N529 Male erectile dysfunction, unspecified: Secondary | ICD-10-CM | POA: Diagnosis not present

## 2013-10-02 ENCOUNTER — Ambulatory Visit (INDEPENDENT_AMBULATORY_CARE_PROVIDER_SITE_OTHER): Payer: Medicare Other | Admitting: Nurse Practitioner

## 2013-10-02 ENCOUNTER — Encounter: Payer: Self-pay | Admitting: Nurse Practitioner

## 2013-10-02 VITALS — BP 118/78 | Ht 68.0 in | Wt 181.6 lb

## 2013-10-02 DIAGNOSIS — J0120 Acute ethmoidal sinusitis, unspecified: Secondary | ICD-10-CM

## 2013-10-02 MED ORDER — AMOXICILLIN-POT CLAVULANATE 875-125 MG PO TABS: 1.0000 | ORAL_TABLET | Freq: Two times a day (BID) | ORAL | Status: DC

## 2013-10-02 NOTE — Progress Notes (Signed)
Subjective:  Presents complaints of sinus symptoms that began 2 days ago. No fever. Ethmoid sinus area headache. Occasional cough. Reduced he mucus with some color. Head congestion and runny nose. No wheezing. Ear pressure. No sore throat. Taking OTC Claritin.  Objective:   BP 118/78  Ht 5\' 8"  (1.727 m)  Wt 181 lb 9.6 oz (82.373 kg)  BMI 27.62 kg/m2 NAD. Alert, oriented. TMs mild clear effusion, no erythema. Nasal mucosa pale and mildly boggy. Pharynx mildly injected with slightly green PND noted. Neck supple with mild soft anterior adenopathy. Lungs clear. Heart regular rate rhythm.  Assessment: Acute ethmoidal sinusitis, recurrence not specified  Plan: Meds ordered this encounter  Medications  . amoxicillin-clavulanate (AUGMENTIN) 875-125 MG per tablet    Sig: Take 1 tablet by mouth 2 (two) times daily.    Dispense:  20 tablet    Refill:  0    Order Specific Question:  Supervising Provider    Answer:  Mikey Kirschner [[id number]]   Continue Claritin as directed. Add Nasacort AQ as directed. Continue ibuprofen as directed for headache. Call back in 5-7 days if no improvement, sooner if worse.

## 2013-10-02 NOTE — Patient Instructions (Signed)
Nasacort AQ as directed 

## 2013-10-14 ENCOUNTER — Encounter (INDEPENDENT_AMBULATORY_CARE_PROVIDER_SITE_OTHER): Payer: Self-pay | Admitting: *Deleted

## 2013-10-14 DIAGNOSIS — N529 Male erectile dysfunction, unspecified: Secondary | ICD-10-CM | POA: Diagnosis not present

## 2013-10-14 DIAGNOSIS — R350 Frequency of micturition: Secondary | ICD-10-CM | POA: Diagnosis not present

## 2013-10-14 DIAGNOSIS — R3915 Urgency of urination: Secondary | ICD-10-CM | POA: Diagnosis not present

## 2013-10-14 DIAGNOSIS — N401 Enlarged prostate with lower urinary tract symptoms: Secondary | ICD-10-CM | POA: Diagnosis not present

## 2013-11-03 ENCOUNTER — Other Ambulatory Visit: Payer: Self-pay | Admitting: Family Medicine

## 2013-11-18 ENCOUNTER — Other Ambulatory Visit (INDEPENDENT_AMBULATORY_CARE_PROVIDER_SITE_OTHER): Payer: Self-pay | Admitting: *Deleted

## 2013-11-18 DIAGNOSIS — Z86010 Personal history of colonic polyps: Secondary | ICD-10-CM

## 2013-11-24 DIAGNOSIS — N529 Male erectile dysfunction, unspecified: Secondary | ICD-10-CM | POA: Diagnosis not present

## 2013-11-24 DIAGNOSIS — N401 Enlarged prostate with lower urinary tract symptoms: Secondary | ICD-10-CM | POA: Diagnosis not present

## 2013-11-24 DIAGNOSIS — N139 Obstructive and reflux uropathy, unspecified: Secondary | ICD-10-CM | POA: Diagnosis not present

## 2013-11-24 DIAGNOSIS — R3915 Urgency of urination: Secondary | ICD-10-CM | POA: Diagnosis not present

## 2013-12-08 ENCOUNTER — Encounter: Payer: Self-pay | Admitting: Family Medicine

## 2013-12-08 ENCOUNTER — Ambulatory Visit (INDEPENDENT_AMBULATORY_CARE_PROVIDER_SITE_OTHER): Payer: Medicare Other | Admitting: Family Medicine

## 2013-12-08 VITALS — BP 124/72 | Ht 67.0 in | Wt 179.0 lb

## 2013-12-08 DIAGNOSIS — Z125 Encounter for screening for malignant neoplasm of prostate: Secondary | ICD-10-CM | POA: Diagnosis not present

## 2013-12-08 DIAGNOSIS — Z79899 Other long term (current) drug therapy: Secondary | ICD-10-CM | POA: Diagnosis not present

## 2013-12-08 DIAGNOSIS — E782 Mixed hyperlipidemia: Secondary | ICD-10-CM | POA: Diagnosis not present

## 2013-12-08 MED ORDER — ALBUTEROL SULFATE HFA 108 (90 BASE) MCG/ACT IN AERS: 2.0000 | INHALATION_SPRAY | Freq: Four times a day (QID) | RESPIRATORY_TRACT | Status: DC | PRN

## 2013-12-08 MED ORDER — HYDROCHLOROTHIAZIDE 25 MG PO TABS: ORAL_TABLET | ORAL | Status: DC

## 2013-12-08 MED ORDER — ATORVASTATIN CALCIUM 20 MG PO TABS: ORAL_TABLET | ORAL | Status: DC

## 2013-12-08 MED ORDER — ENALAPRIL MALEATE 10 MG PO TABS: ORAL_TABLET | ORAL | Status: DC

## 2013-12-08 MED ORDER — ATENOLOL 50 MG PO TABS: ORAL_TABLET | ORAL | Status: DC

## 2013-12-08 MED ORDER — ETODOLAC 400 MG PO TABS: ORAL_TABLET | ORAL | Status: DC

## 2013-12-08 MED ORDER — TAMSULOSIN HCL 0.4 MG PO CAPS: ORAL_CAPSULE | ORAL | Status: DC

## 2013-12-08 NOTE — Progress Notes (Signed)
   Subjective:    Patient ID: Joshua Alvarez, male    DOB: 1945/08/01, 68 y.o.   MRN: [id number]  Hypertension This is a chronic problem. The current episode started more than 1 year ago. There are no compliance problems.    Patient states no concerns. Needs refills on meds.   Exercising reg three to four times per wk  More veggies, eating better  Numb generally good with the bp,   Puncture wound with hook. This occurred last week. Wonders when his last tetanus shot was.  History of elevated sugars in the past. Trying to watch his diet.  History of borderline lipids in the past. Mostly has cut down fat intake.    Review of Systems No chest pain no headache no back pain no abdominal pain no change in bowel habits no blood in stool no rash ROS otherwise negative.    Objective:   Physical Exam Alert no apparent distress. Vitals stable. Blood pressure good on repeat. HEENT normal. Lungs clear. Heart regular rate and rhythm.       Assessment & Plan:  Impression 1 hypertension good control. #2 impaired fasting glucose status uncertain. #3 history of elevated cholesterol status uncertain. #4 puncture wound no infection evident. Last tetanus shot 5 years ago. Plan check appropriate blood work. Diet exercise discussed. Maintain same meds. Check every 6 months. WSL

## 2013-12-22 ENCOUNTER — Other Ambulatory Visit: Payer: Self-pay | Admitting: Family Medicine

## 2013-12-22 NOTE — Telephone Encounter (Signed)
Last seen 12/08/13.

## 2013-12-23 DIAGNOSIS — Z79899 Other long term (current) drug therapy: Secondary | ICD-10-CM | POA: Diagnosis not present

## 2013-12-23 DIAGNOSIS — E782 Mixed hyperlipidemia: Secondary | ICD-10-CM | POA: Diagnosis not present

## 2013-12-23 DIAGNOSIS — Z125 Encounter for screening for malignant neoplasm of prostate: Secondary | ICD-10-CM | POA: Diagnosis not present

## 2013-12-23 LAB — HEPATIC FUNCTION PANEL
ALT: 18 U/L (ref 0–53)
AST: 16 U/L (ref 0–37)
Albumin: 4.1 g/dL (ref 3.5–5.2)
Alkaline Phosphatase: 81 U/L (ref 39–117)
Bilirubin, Direct: 0.2 mg/dL (ref 0.0–0.3)
Indirect Bilirubin: 0.6 mg/dL (ref 0.2–1.2)
TOTAL PROTEIN: 6.6 g/dL (ref 6.0–8.3)
Total Bilirubin: 0.8 mg/dL (ref 0.2–1.2)

## 2013-12-23 LAB — BASIC METABOLIC PANEL
BUN: 12 mg/dL (ref 6–23)
CALCIUM: 9.2 mg/dL (ref 8.4–10.5)
CO2: 29 mEq/L (ref 19–32)
Chloride: 103 mEq/L (ref 96–112)
Creat: 0.99 mg/dL (ref 0.50–1.35)
Glucose, Bld: 98 mg/dL (ref 70–99)
Potassium: 4.6 mEq/L (ref 3.5–5.3)
SODIUM: 138 meq/L (ref 135–145)

## 2013-12-23 LAB — LIPID PANEL
CHOL/HDL RATIO: 3.7 ratio
CHOLESTEROL: 152 mg/dL (ref 0–200)
HDL: 41 mg/dL (ref >39)
LDL Cholesterol: 86 mg/dL (ref 0–99)
Triglycerides: 126 mg/dL (ref <150)
VLDL: 25 mg/dL (ref 0–40)

## 2013-12-24 LAB — PSA, MEDICARE: PSA: 4.35 ng/mL — AB (ref <=4.00)

## 2013-12-26 ENCOUNTER — Other Ambulatory Visit: Payer: Self-pay | Admitting: Dermatology

## 2013-12-26 DIAGNOSIS — C44611 Basal cell carcinoma of skin of unspecified upper limb, including shoulder: Secondary | ICD-10-CM | POA: Diagnosis not present

## 2013-12-26 DIAGNOSIS — Z85828 Personal history of other malignant neoplasm of skin: Secondary | ICD-10-CM | POA: Diagnosis not present

## 2013-12-26 DIAGNOSIS — L570 Actinic keratosis: Secondary | ICD-10-CM | POA: Diagnosis not present

## 2013-12-26 DIAGNOSIS — D239 Other benign neoplasm of skin, unspecified: Secondary | ICD-10-CM | POA: Diagnosis not present

## 2013-12-26 DIAGNOSIS — D485 Neoplasm of uncertain behavior of skin: Secondary | ICD-10-CM | POA: Diagnosis not present

## 2014-01-01 DIAGNOSIS — N139 Obstructive and reflux uropathy, unspecified: Secondary | ICD-10-CM | POA: Diagnosis not present

## 2014-01-01 DIAGNOSIS — R3915 Urgency of urination: Secondary | ICD-10-CM | POA: Diagnosis not present

## 2014-01-01 DIAGNOSIS — R350 Frequency of micturition: Secondary | ICD-10-CM | POA: Diagnosis not present

## 2014-01-01 DIAGNOSIS — N401 Enlarged prostate with lower urinary tract symptoms: Secondary | ICD-10-CM | POA: Diagnosis not present

## 2014-01-02 ENCOUNTER — Ambulatory Visit (INDEPENDENT_AMBULATORY_CARE_PROVIDER_SITE_OTHER): Payer: Medicare Other | Admitting: Family Medicine

## 2014-01-02 ENCOUNTER — Encounter: Payer: Self-pay | Admitting: Family Medicine

## 2014-01-02 VITALS — BP 132/84 | Ht 68.0 in | Wt 178.0 lb

## 2014-01-02 DIAGNOSIS — Z23 Encounter for immunization: Secondary | ICD-10-CM | POA: Diagnosis not present

## 2014-01-02 DIAGNOSIS — R972 Elevated prostate specific antigen [PSA]: Secondary | ICD-10-CM | POA: Diagnosis not present

## 2014-01-02 MED ORDER — AMOXICILLIN 500 MG PO CAPS: 500.0000 mg | ORAL_CAPSULE | Freq: Three times a day (TID) | ORAL | Status: DC

## 2014-01-02 NOTE — Progress Notes (Signed)
   Subjective:    Patient ID: Joshua Alvarez, male    DOB: 01-10-1946, 68 y.o.   MRN: [id number]  HPIFollow up on bloodwork. Elevated PSA.  Scratchy throat. Started this am. No cough no headache no fever or chills  Declined flu vaccine.   Louis Meckel, MD is pt's urologist  [facility]. Has seen them in the past for kidney stones.  Review of Systems No abdominal pain no change in urinary habits minimal nocturia    Objective:   Physical Exam  Alert no apparent distress. Vitals stable. HET Mondays congestion pharynx no obvious erythema neck supple. Lungs clear heart rare rhythm.      Assessment & Plan:  Impression 1 URI discuss #2 elevated PSA discuss at length. Need urology workup. #3 prostate hypertrophy discussed may well be source of #2. However workup warranted. Plan referral back to Dr. Randall Hiss. Symptomatic care for throat 25 minutes spent most in discussion. WSL

## 2014-01-04 DIAGNOSIS — R972 Elevated prostate specific antigen [PSA]: Secondary | ICD-10-CM | POA: Insufficient documentation

## 2014-01-05 ENCOUNTER — Other Ambulatory Visit: Payer: Self-pay | Admitting: *Deleted

## 2014-01-05 MED ORDER — ENALAPRIL MALEATE 10 MG PO TABS: ORAL_TABLET | ORAL | Status: DC

## 2014-01-09 ENCOUNTER — Telehealth (INDEPENDENT_AMBULATORY_CARE_PROVIDER_SITE_OTHER): Payer: Self-pay | Admitting: *Deleted

## 2014-01-09 DIAGNOSIS — Z1211 Encounter for screening for malignant neoplasm of colon: Secondary | ICD-10-CM

## 2014-01-09 NOTE — Telephone Encounter (Signed)
Patient needs movi prep 

## 2014-01-12 MED ORDER — PEG-KCL-NACL-NASULF-NA ASC-C 100 G PO SOLR: 1.0000 | Freq: Once | ORAL | Status: DC

## 2014-01-15 ENCOUNTER — Telehealth (INDEPENDENT_AMBULATORY_CARE_PROVIDER_SITE_OTHER): Payer: Self-pay | Admitting: *Deleted

## 2014-01-15 NOTE — Telephone Encounter (Signed)
agree

## 2014-01-15 NOTE — Telephone Encounter (Signed)
PCP: Richardson Landry luking   Procedure: tcs  Reason/Indication:  Hx polyps  Has patient had this procedure before?  Yes, 2010 -- epic  If so, when, by whom and where?    Is there a family history of colon cancer?  no  Who?  What age when diagnosed?    Is patient diabetic?   no      Does patient have prosthetic heart valve?  no  Do you have a pacemaker?  no  Has patient ever had endocarditis? no  Has patient had joint replacement within last 12 months?  no  Does patient tend to be constipated or take laxatives? no  Is patient on Coumadin, Plavix and/or Aspirin? no  Medications: atenolol 50 mg daily, hctz 25 mg 1/2 tab daily, enalapril 10 mg daily, tamsulosin 0.4 mg daily  Allergies: nkda  Medication Adjustment:   Procedure date & time: 02/11/14 at 730

## 2014-01-29 ENCOUNTER — Encounter (HOSPITAL_COMMUNITY): Payer: Self-pay | Admitting: Pharmacy Technician

## 2014-02-11 ENCOUNTER — Ambulatory Visit (HOSPITAL_COMMUNITY)
Admission: RE | Admit: 2014-02-11 | Discharge: 2014-02-11 | Disposition: A | Discharge status: Home / Self Care | Payer: Medicare Other | Source: Ambulatory Visit | Attending: Internal Medicine | Admitting: Internal Medicine

## 2014-02-11 ENCOUNTER — Encounter (HOSPITAL_COMMUNITY)
Admission: RE | Disposition: A | Discharge status: Home / Self Care | Payer: Self-pay | Source: Ambulatory Visit | Attending: Internal Medicine

## 2014-02-11 ENCOUNTER — Encounter (HOSPITAL_COMMUNITY): Payer: Self-pay | Admitting: *Deleted

## 2014-02-11 DIAGNOSIS — D122 Benign neoplasm of ascending colon: Secondary | ICD-10-CM | POA: Diagnosis not present

## 2014-02-11 DIAGNOSIS — Z79899 Other long term (current) drug therapy: Secondary | ICD-10-CM | POA: Diagnosis not present

## 2014-02-11 DIAGNOSIS — K644 Residual hemorrhoidal skin tags: Secondary | ICD-10-CM | POA: Insufficient documentation

## 2014-02-11 DIAGNOSIS — Z86010 Personal history of colonic polyps: Secondary | ICD-10-CM | POA: Insufficient documentation

## 2014-02-11 DIAGNOSIS — K648 Other hemorrhoids: Secondary | ICD-10-CM | POA: Diagnosis not present

## 2014-02-11 DIAGNOSIS — I10 Essential (primary) hypertension: Secondary | ICD-10-CM | POA: Diagnosis not present

## 2014-02-11 DIAGNOSIS — D649 Anemia, unspecified: Secondary | ICD-10-CM | POA: Diagnosis not present

## 2014-02-11 DIAGNOSIS — K921 Melena: Secondary | ICD-10-CM | POA: Insufficient documentation

## 2014-02-11 DIAGNOSIS — K5730 Diverticulosis of large intestine without perforation or abscess without bleeding: Secondary | ICD-10-CM | POA: Insufficient documentation

## 2014-02-11 DIAGNOSIS — D123 Benign neoplasm of transverse colon: Secondary | ICD-10-CM | POA: Insufficient documentation

## 2014-02-11 HISTORY — PX: COLONOSCOPY: SHX5424

## 2014-02-11 SURGERY — COLONOSCOPY
Anesthesia: Moderate Sedation

## 2014-02-11 MED ORDER — MIDAZOLAM HCL 5 MG/5ML IJ SOLN
INTRAMUSCULAR | Status: DC | PRN
  Administered 2014-02-11 (×2): 2 mg via INTRAVENOUS

## 2014-02-11 MED ORDER — MIDAZOLAM HCL 5 MG/5ML IJ SOLN
INTRAMUSCULAR | Status: AC
  Filled 2014-02-11: qty 10

## 2014-02-11 MED ORDER — MEPERIDINE HCL 50 MG/ML IJ SOLN
INTRAMUSCULAR | Status: AC
  Filled 2014-02-11: qty 1

## 2014-02-11 MED ORDER — SODIUM CHLORIDE 0.9 % IV SOLN
INTRAVENOUS | Status: DC
Start: 2014-02-11 — End: 2014-02-11
  Administered 2014-02-11: 1000 mL via INTRAVENOUS

## 2014-02-11 MED ORDER — MEPERIDINE HCL 50 MG/ML IJ SOLN
INTRAMUSCULAR | Status: DC | PRN
  Administered 2014-02-11 (×2): 25 mg

## 2014-02-11 MED ORDER — STERILE WATER FOR IRRIGATION IR SOLN
Status: DC | PRN
  Administered 2014-02-11: 07:00:00

## 2014-02-11 NOTE — H&P (Signed)
Joshua Alvarez is an 68 y.o. male.   Chief Complaint: patient is here for colonoscopy. HPI: Patient is 68 year old Caucasian male with history of colonic adenomas and is here for colonoscopy. This is patient's fourth colonoscopy. Last exam was in July 2010 with removal for Joshua Alvarez polyps and they're tubular adenomas. He denies abdominal pain change in bowel habits of frank rectal bleeding. He has occasional hematochezia secondary to hemorrhoids. Family history is negative for CRC.  Past Medical History  Diagnosis Date  . Hypertension   . Allergic rhinitis   . Migraine headache   . Colon polyp     Past Surgical History  Procedure Laterality Date  . Right knee repaired    . Colonoscopy      Family History  Problem Relation Age of Onset  . Cancer Mother     Breast  . Heart attack Father    Social History:  reports that he has never smoked. He does not have any smokeless tobacco history on file. His alcohol and drug histories are not on file.  Allergies: No Known Allergies  Medications Prior to Admission  Medication Sig Dispense Refill  . albuterol (PROVENTIL HFA;VENTOLIN HFA) 108 (90 BASE) MCG/ACT inhaler Inhale 2 puffs into the lungs every 6 (six) hours as needed for wheezing or shortness of breath. 1 Inhaler 5  . atenolol (TENORMIN) 50 MG tablet TAKE ONE (1) TABLET EACH DAY (Patient taking differently: Take 50 mg by mouth daily. ) 90 tablet 1  . atorvastatin (LIPITOR) 20 MG tablet TAKE ONE TABLET BY MOUTH EVERY NIGHT AT BEDTIME (Patient taking differently: Take 20 mg by mouth at bedtime. ) 90 tablet 1  . enalapril (VASOTEC) 10 MG tablet TAKE ONE (1) TABLET EACH DAY (Patient taking differently: Take 10 mg by mouth daily. ) 90 tablet 1  . etodolac (LODINE) 400 MG tablet TAKE ONE TABLET BY MOUTH TWICE DAILY AS NEEDED FOR PAIN. TAKE WITH FOOD. 42 tablet 2  . hydrochlorothiazide (HYDRODIURIL) 25 MG tablet TAKE ONE-HALF TABLET BY MOUTH EVERY MORNING (Patient taking differently: Take 12.5  mg by mouth daily. ) 45 tablet 1  . sildenafil (VIAGRA) 50 MG tablet 1 PO 2 hrs before sex (Patient taking differently: Take 50 mg by mouth as needed for erectile dysfunction. ) 6 tablet 6  . tamsulosin (FLOMAX) 0.4 MG CAPS capsule TAKE ONE CAPSULE BY MOUTH AT BEDTIME (Patient taking differently: Take 0.4 mg by mouth at bedtime. ) 90 capsule 1    No results found for this or any previous visit (from the past 48 hour(s)). No results found.  ROS  Blood pressure 146/99, pulse 64, temperature 97.9 F (36.6 C), temperature source Oral, resp. rate 16, height 5\' 8"  (1.727 m), weight 180 lb (81.647 kg), SpO2 98 %. Physical Exam  Constitutional: He appears well-developed and well-nourished.  HENT:  Mouth/Throat: Oropharynx is clear and moist.  Eyes: Conjunctivae are normal. No scleral icterus.  Neck: No thyromegaly present.  Cardiovascular: Normal rate, regular rhythm and normal heart sounds.   No murmur heard. Respiratory: Effort normal.  GI: Soft. He exhibits no distension and no mass. There is no tenderness.  Musculoskeletal: He exhibits no edema.  Lymphadenopathy:    He has no cervical adenopathy.  Neurological: He is alert.  Skin: Skin is warm and dry.     Assessment/Plan History of colonic adenomas. Surveillance colonoscopy.  Shamal Stracener U 02/11/2014, 7:31 AM

## 2014-02-11 NOTE — Discharge Instructions (Signed)
Resume usual medications and high fiber diet. °No driving for 24 hours. °Physician will call with biopsy results ° ° °High-Fiber Diet °Fiber is found in fruits, vegetables, and grains. A high-fiber diet encourages the addition of more whole grains, legumes, fruits, and vegetables in your diet. The recommended amount of fiber for adult males is 38 g per day. For adult females, it is 25 g per day. Pregnant and lactating women should get 28 g of fiber per day. If you have a digestive or bowel problem, ask your caregiver for advice before adding high-fiber foods to your diet. Eat a variety of high-fiber foods instead of only a select few type of foods.  °PURPOSE °· To increase stool bulk. °· To make bowel movements more regular to prevent constipation. °· To lower cholesterol. °· To prevent overeating. °WHEN IS THIS DIET USED? °· It may be used if you have constipation and hemorrhoids. °· It may be used if you have uncomplicated diverticulosis (intestine condition) and irritable bowel syndrome. °· It may be used if you need help with weight management. °· It may be used if you want to add it to your diet as a protective measure against atherosclerosis, diabetes, and cancer. °SOURCES OF FIBER °· Whole-grain breads and cereals. °· Fruits, such as apples, oranges, bananas, berries, prunes, and pears. °· Vegetables, such as green peas, carrots, sweet potatoes, beets, broccoli, cabbage, spinach, and artichokes. °· Legumes, such split peas, soy, lentils. °· Almonds. °FIBER CONTENT IN FOODS °Starches and Grains / Dietary Fiber (g) °· Cheerios, 1 cup / 3 g °· Corn Flakes cereal, 1 cup / 0.7 g °· Rice crispy treat cereal, 1¼ cup / 0.3 g °· Instant oatmeal (cooked), ½ cup / 2 g °· Frosted wheat cereal, 1 cup / 5.1 g °· Brown, long-grain rice (cooked), 1 cup / 3.5 g °· White, long-grain rice (cooked), 1 cup / 0.6 g °· Enriched macaroni (cooked), 1 cup / 2.5 g °Legumes / Dietary Fiber (g) °· Baked beans (canned, plain, or  vegetarian), ½ cup / 5.2 g °· Kidney beans (canned), ½ cup / 6.8 g °· Pinto beans (cooked), ½ cup / 5.5 g °Breads and Crackers / Dietary Fiber (g) °· Plain or honey graham crackers, 2 squares / 0.7 g °· Saltine crackers, 3 squares / 0.3 g °· Plain, salted pretzels, 10 pieces / 1.8 g °· Whole-wheat bread, 1 slice / 1.9 g °· White bread, 1 slice / 0.7 g °· Raisin bread, 1 slice / 1.2 g °· Plain bagel, 3 oz / 2 g °· Flour tortilla, 1 oz / 0.9 g °· Corn tortilla, 1 Ndiaye / 1.5 g °· Hamburger or hotdog bun, 1 Ragon / 0.9 g °Fruits / Dietary Fiber (g) °· Apple with skin, 1 medium / 4.4 g °· Sweetened applesauce, ½ cup / 1.5 g °· Banana, ½ medium / 1.5 g °· Grapes, 10 grapes / 0.4 g °· Orange, 1 Kreider / 2.3 g °· Raisin, 1.5 oz / 1.6 g °· Melon, 1 cup / 1.4 g °Vegetables / Dietary Fiber (g) °· Green beans (canned), ½ cup / 1.3 g °· Carrots (cooked), ½ cup / 2.3 g °· Broccoli (cooked), ½ cup / 2.8 g °· Peas (cooked), ½ cup / 4.4 g °· Mashed potatoes, ½ cup / 1.6 g °· Lettuce, 1 cup / 0.5 g °· Corn (canned), ½ cup / 1.6 g °· Tomato, ½ cup / 1.1 g °Document Released: 03/27/2005 Document Revised: 09/26/2011 Document Reviewed: 06/29/2011 °[employer]® Patient   Information ©2015 [employer]. This information is not intended to replace advice given to you by your health care provider. Make sure you discuss any questions you have with your health care provider. °Colonoscopy, Care After °These instructions give you information on caring for yourself after your procedure. Your doctor may also give you more specific instructions. Call your doctor if you have any problems or questions after your procedure. °HOME CARE °· Do not drive for 24 hours. °· Do not sign important papers or use machinery for 24 hours. °· You may shower. °· You may go back to your usual activities, but go slower for the first 24 hours. °· Take rest breaks often during the first 24 hours. °· Walk around or use warm packs on your belly (abdomen) if you have belly  cramping or gas. °· Drink enough fluids to keep your pee (urine) clear or pale yellow. °· Resume your normal diet. Avoid heavy or fried foods. °· Avoid drinking alcohol for 24 hours or as told by your doctor. °· Only take medicines as told by your doctor. °If a tissue sample (biopsy) was taken during the procedure:  °· Do not take aspirin or blood thinners for 7 days, or as told by your doctor. °· Do not drink alcohol for 7 days, or as told by your doctor. °· Eat soft foods for the first 24 hours. °GET HELP IF: °You still have a Burgard amount of blood in your poop (stool) 2-3 days after the procedure. °GET HELP RIGHT AWAY IF: °· You have more than a Arnell amount of blood in your poop. °· You see clumps of tissue (blood clots) in your poop. °· Your belly is puffy (swollen). °· You feel sick to your stomach (nauseous) or throw up (vomit). °· You have a fever. °· You have belly pain that gets worse and medicine does not help. °MAKE SURE YOU: °· Understand these instructions. °· Will watch your condition. °· Will get help right away if you are not doing well or get worse. °Document Released: 04/29/2010 Document Revised: 04/01/2013 Document Reviewed: 12/02/2012 °Exit[employer]® Patient Information ©2015 [employer]. This information is not intended to replace advice given to you by your health care provider. Make sure you discuss any questions you have with your health care provider. ° °

## 2014-02-11 NOTE — Op Note (Signed)
COLONOSCOPY PROCEDURE REPORT  PATIENT:  Joshua Alvarez  MR#:  [id number] Birthdate:  1945-10-04, 68 y.o., male Endoscopist:  Dr. Rogene Houston, MD Referred By:  Dr. Grace Bushy. Wolfgang Phoenix, MD Procedure Date: 02/11/2014  Procedure:   Colonoscopy  Indications:  Patient is 68 year old Caucasian male with history of colonic adenomas. His last exam was in July 2010 with removal of 4 Needham tubular adenomas. He has had adenomas removed on prior colonoscopies. He has intermittent hematochezia felt to be secondary to hemorrhoids. Family history is negative for CRC.  Informed Consent:  The procedure and risks were reviewed with the patient and informed consent was obtained.  Medications:  Demerol 50 mg IV Versed 4 mg IV  Description of procedure:  After a digital rectal exam was performed, that colonoscope was advanced from the anus through the rectum and colon to the area of the cecum, ileocecal valve and appendiceal orifice. The cecum was deeply intubated. These structures were well-seen and photographed for the record. From the level of the cecum and ileocecal valve, the scope was slowly and cautiously withdrawn. The mucosal surfaces were carefully surveyed utilizing scope tip to flexion to facilitate fold flattening as needed. The scope was pulled down into the rectum where a thorough exam including retroflexion was performed.  Findings:   Prep excellent. 3 mm polyp ablated via cold biopsy from hepatic flexure. Single Whisenant diverticulum at hepatic flexure along with few more at sigmoid colon. Normal rectal mucosa. Single hemorrhoid above and below the dentate line and anal papilla.   Therapeutic/Diagnostic Maneuvers Performed:  See above  Complications:  none  Cecal Withdrawal Time:  8 minutes  Impression:  Examination performed to cecum. Mild sigmoid colon diverticulosis along with single diverticulum at hepatic flexure. 3 mm polyp ablated via cold biopsy from hepatic  flexure. Internal/external hemorrhoid and single anal papilla.  Recommendations:  Standard instructions given. High fiber diet. I will contact patient with biopsy results and further recommendations.  Joshua Alvarez U  02/11/2014 8:01 AM  CC: Dr. Rubbie Battiest, MD & Dr. Rayne Du ref. provider found

## 2014-02-13 ENCOUNTER — Encounter (HOSPITAL_COMMUNITY): Payer: Self-pay | Admitting: Internal Medicine

## 2014-02-26 DIAGNOSIS — C44619 Basal cell carcinoma of skin of left upper limb, including shoulder: Secondary | ICD-10-CM | POA: Diagnosis not present

## 2014-03-12 ENCOUNTER — Encounter: Payer: Self-pay | Admitting: Family Medicine

## 2014-03-12 ENCOUNTER — Ambulatory Visit (INDEPENDENT_AMBULATORY_CARE_PROVIDER_SITE_OTHER): Payer: Medicare Other | Admitting: Family Medicine

## 2014-03-12 VITALS — BP 118/72 | Temp 97.8°F | Ht 68.0 in | Wt 178.0 lb

## 2014-03-12 DIAGNOSIS — J329 Chronic sinusitis, unspecified: Secondary | ICD-10-CM | POA: Diagnosis not present

## 2014-03-12 DIAGNOSIS — J310 Chronic rhinitis: Secondary | ICD-10-CM

## 2014-03-12 MED ORDER — AMOXICILLIN-POT CLAVULANATE 875-125 MG PO TABS: 1.0000 | ORAL_TABLET | Freq: Two times a day (BID) | ORAL | Status: AC

## 2014-03-12 NOTE — Progress Notes (Signed)
   Subjective:    Patient ID: MOHD. DERFLINGER, male    DOB: [birth year], 68 y.o.   MRN: [id number]  HPI  Patient arrives also cough congestion. Drainage. Sore throat. Diminished energy. Achy. 2-1/2 weeks' duration.  Review of Systems No vomiting no diarrhea occasional nocturnal cough no abdominal pain    Objective:   Physical Exam  Alert vital stable hydration good. HEENT moderate his congestion frontal tenderness. Bronchial cough no wheezes no crackles      Assessment & Plan:  Impression post viral rhinosinusitis/bronchitis plan antibiotics prescribed. Symptomatic care discussed. WSL

## 2014-03-12 NOTE — Progress Notes (Deleted)
Subjective:    Patient ID: Joshua Alvarez, male    DOB: 01-07-1946, 68 y.o.   MRN: [id number]  Sinusitis This is a new problem. The current episode started 1 to 4 weeks ago. The problem is unchanged. There has been no fever. Associated symptoms include congestion, coughing, ear pain, headaches and sinus pressure. Past treatments include nothing.  Review of Systems  HENT: Positive for congestion, ear pain and sinus pressure.   Respiratory: Positive for cough.   Neurological: Positive for headaches.       Objective:   Physical Exam        Assessment & Plan:

## 2014-04-06 ENCOUNTER — Encounter: Payer: Self-pay | Admitting: Family Medicine

## 2014-04-06 ENCOUNTER — Ambulatory Visit (INDEPENDENT_AMBULATORY_CARE_PROVIDER_SITE_OTHER): Payer: Medicare Other | Admitting: Family Medicine

## 2014-04-06 VITALS — BP 134/84 | Temp 98.2°F | Ht 68.0 in | Wt 179.0 lb

## 2014-04-06 DIAGNOSIS — J019 Acute sinusitis, unspecified: Secondary | ICD-10-CM | POA: Diagnosis not present

## 2014-04-06 DIAGNOSIS — J208 Acute bronchitis due to other specified organisms: Secondary | ICD-10-CM | POA: Diagnosis not present

## 2014-04-06 DIAGNOSIS — B9689 Other specified bacterial agents as the cause of diseases classified elsewhere: Secondary | ICD-10-CM

## 2014-04-06 MED ORDER — CEFPROZIL 500 MG PO TABS: 500.0000 mg | ORAL_TABLET | Freq: Two times a day (BID) | ORAL | Status: DC

## 2014-04-06 NOTE — Progress Notes (Signed)
   Subjective:    Patient ID: Joshua Alvarez, male    DOB: 11-14-1945, 68 y.o.   MRN: [id number]  Cough This is a new problem. The current episode started in the past 7 days. Associated symptoms include ear pain, myalgias, rhinorrhea and sweats. Pertinent negatives include no chest pain, fever or wheezing. Associated symptoms comments: Sinus pressure. Treatments tried: mucinex.   Head congestion drainage coughing not feeling good present over the past few days   Review of Systems  Constitutional: Negative for fever and activity change.  HENT: Positive for congestion, ear pain and rhinorrhea.   Eyes: Negative for discharge.  Respiratory: Positive for cough. Negative for wheezing.   Cardiovascular: Negative for chest pain.  Musculoskeletal: Positive for myalgias.   PMH benign    Objective:   Physical Exam  Constitutional: He appears well-developed.  HENT:  Head: Normocephalic.  Mouth/Throat: Oropharynx is clear and moist. No oropharyngeal exudate.  Neck: Normal range of motion.  Cardiovascular: Normal rate, regular rhythm and normal heart sounds.   No murmur heard. Pulmonary/Chest: Effort normal and breath sounds normal. He has no wheezes.  Lymphadenopathy:    He has no cervical adenopathy.  Neurological: He exhibits normal muscle tone.  Skin: Skin is warm and dry.  Nursing note and vitals reviewed.         Assessment & Plan:  Viral syndrome Acute rhinosinusitis Antibiotics prescribed If progressive sickness or worse follow-up immediately Warning signs were discussed.

## 2014-04-13 ENCOUNTER — Encounter: Payer: Self-pay | Admitting: Family Medicine

## 2014-04-13 ENCOUNTER — Ambulatory Visit (INDEPENDENT_AMBULATORY_CARE_PROVIDER_SITE_OTHER): Payer: Medicare Other | Admitting: Family Medicine

## 2014-04-13 VITALS — BP 128/78 | Temp 97.6°F | Ht 68.0 in | Wt 178.5 lb

## 2014-04-13 DIAGNOSIS — J019 Acute sinusitis, unspecified: Secondary | ICD-10-CM | POA: Diagnosis not present

## 2014-04-13 DIAGNOSIS — B9689 Other specified bacterial agents as the cause of diseases classified elsewhere: Secondary | ICD-10-CM

## 2014-04-13 MED ORDER — CEFTRIAXONE SODIUM 1 G IJ SOLR
1.0000 g | Freq: Once | INTRAMUSCULAR | Status: AC
  Administered 2014-04-13: 1 g via INTRAMUSCULAR

## 2014-04-13 MED ORDER — LEVOFLOXACIN 500 MG PO TABS: 500.0000 mg | ORAL_TABLET | Freq: Every day | ORAL | Status: DC

## 2014-04-13 NOTE — Progress Notes (Signed)
   Subjective:    Patient ID: Joshua Alvarez, male    DOB: [birth year], 69 y.o.   MRN: [id number]  Cough This is a new problem. The current episode started in the past 7 days. The problem has been gradually worsening. The cough is productive of sputum. Associated symptoms comments: Sinus congestion, ear pressure. Nothing aggravates the symptoms. Treatments tried: antibiotic. The treatment provided no relief.   Patient states that he has no other concerns at this time.  Please see previous note. Patient states he got worse Saturday and Sunday with sinus pain discomfort. Denies wheezing or difficulty breathing no nausea vomiting denies high fever relates sinus pain discomfort and headache  Review of Systems  Respiratory: Positive for cough.        Objective:   Physical Exam  Constitutional: He appears well-developed and well-nourished. No distress.  HENT:  Head: Normocephalic.  Mouth/Throat: Oropharynx is clear and moist. No oropharyngeal exudate.  Neck: Normal range of motion.  Cardiovascular: Normal rate, regular rhythm and normal heart sounds.   No murmur heard. Pulmonary/Chest: Effort normal and breath sounds normal. No respiratory distress. He has no wheezes.  Musculoskeletal: He exhibits no edema.  Lymphadenopathy:    He has no cervical adenopathy.  Neurological: He is alert. He exhibits normal muscle tone.  Skin: Skin is warm and dry.  Psychiatric: His behavior is normal.  Nursing note and vitals reviewed.  No evidence of meningitis       Assessment & Plan:  Acute rhinosinusitis-failed Cefzil. Having tenderness in the sinuses. Levaquin daily for the next 10 days plus also Rocephin 1 g, if progressive troubles or if worse follow-up

## 2014-06-11 DIAGNOSIS — L570 Actinic keratosis: Secondary | ICD-10-CM | POA: Diagnosis not present

## 2014-06-11 DIAGNOSIS — D2272 Melanocytic nevi of left lower limb, including hip: Secondary | ICD-10-CM | POA: Diagnosis not present

## 2014-06-11 DIAGNOSIS — Z85828 Personal history of other malignant neoplasm of skin: Secondary | ICD-10-CM | POA: Diagnosis not present

## 2014-06-11 DIAGNOSIS — D225 Melanocytic nevi of trunk: Secondary | ICD-10-CM | POA: Diagnosis not present

## 2014-06-11 DIAGNOSIS — D2271 Melanocytic nevi of right lower limb, including hip: Secondary | ICD-10-CM | POA: Diagnosis not present

## 2014-06-11 DIAGNOSIS — L814 Other melanin hyperpigmentation: Secondary | ICD-10-CM | POA: Diagnosis not present

## 2014-06-25 DIAGNOSIS — N401 Enlarged prostate with lower urinary tract symptoms: Secondary | ICD-10-CM | POA: Diagnosis not present

## 2014-06-26 ENCOUNTER — Other Ambulatory Visit: Payer: Self-pay | Admitting: Family Medicine

## 2014-07-02 DIAGNOSIS — N401 Enlarged prostate with lower urinary tract symptoms: Secondary | ICD-10-CM | POA: Diagnosis not present

## 2014-07-02 DIAGNOSIS — N200 Calculus of kidney: Secondary | ICD-10-CM | POA: Diagnosis not present

## 2014-07-02 DIAGNOSIS — R972 Elevated prostate specific antigen [PSA]: Secondary | ICD-10-CM | POA: Diagnosis not present

## 2014-07-02 DIAGNOSIS — N139 Obstructive and reflux uropathy, unspecified: Secondary | ICD-10-CM | POA: Diagnosis not present

## 2014-07-15 ENCOUNTER — Telehealth: Payer: Self-pay | Admitting: Family Medicine

## 2014-07-15 DIAGNOSIS — Z125 Encounter for screening for malignant neoplasm of prostate: Secondary | ICD-10-CM

## 2014-07-15 DIAGNOSIS — Z79899 Other long term (current) drug therapy: Secondary | ICD-10-CM

## 2014-07-15 DIAGNOSIS — E785 Hyperlipidemia, unspecified: Secondary | ICD-10-CM

## 2014-07-15 NOTE — Telephone Encounter (Signed)
Lip liv psa

## 2014-07-15 NOTE — Telephone Encounter (Signed)
Pt is requesting lab orders to be sent in.  Last labs were lipid,hepatic,psa,bmet on 12-23-13

## 2014-07-16 NOTE — Telephone Encounter (Signed)
Wife notified that blood work has been ordered and have patient report to [employer].

## 2014-07-27 DIAGNOSIS — H1045 Other chronic allergic conjunctivitis: Secondary | ICD-10-CM | POA: Diagnosis not present

## 2014-07-27 DIAGNOSIS — H52223 Regular astigmatism, bilateral: Secondary | ICD-10-CM | POA: Diagnosis not present

## 2014-07-27 DIAGNOSIS — E785 Hyperlipidemia, unspecified: Secondary | ICD-10-CM | POA: Diagnosis not present

## 2014-07-27 DIAGNOSIS — Z125 Encounter for screening for malignant neoplasm of prostate: Secondary | ICD-10-CM | POA: Diagnosis not present

## 2014-07-27 DIAGNOSIS — H524 Presbyopia: Secondary | ICD-10-CM | POA: Diagnosis not present

## 2014-07-27 DIAGNOSIS — Z79899 Other long term (current) drug therapy: Secondary | ICD-10-CM | POA: Diagnosis not present

## 2014-07-27 DIAGNOSIS — H5213 Myopia, bilateral: Secondary | ICD-10-CM | POA: Diagnosis not present

## 2014-07-28 LAB — HEPATIC FUNCTION PANEL
ALK PHOS: 95 IU/L (ref 39–117)
ALT: 13 IU/L (ref 0–44)
AST: 16 IU/L (ref 0–40)
Albumin: 4.3 g/dL (ref 3.6–4.8)
Bilirubin Total: 0.5 mg/dL (ref 0.0–1.2)
Bilirubin, Direct: 0.07 mg/dL (ref 0.00–0.40)
Total Protein: 6.6 g/dL (ref 6.0–8.5)

## 2014-07-28 LAB — LIPID PANEL
CHOL/HDL RATIO: 5 ratio (ref 0.0–5.0)
Cholesterol, Total: 224 mg/dL — ABNORMAL HIGH (ref 100–199)
HDL: 45 mg/dL (ref >39)
LDL CALC: 141 mg/dL — AB (ref 0–99)
TRIGLYCERIDES: 189 mg/dL — AB (ref 0–149)
VLDL Cholesterol Cal: 38 mg/dL (ref 5–40)

## 2014-07-28 LAB — PSA: PSA: 3.9 ng/mL (ref 0.0–4.0)

## 2014-07-30 ENCOUNTER — Encounter: Payer: Self-pay | Admitting: Family Medicine

## 2014-07-30 ENCOUNTER — Ambulatory Visit (INDEPENDENT_AMBULATORY_CARE_PROVIDER_SITE_OTHER): Payer: Medicare Other | Admitting: Family Medicine

## 2014-07-30 VITALS — BP 120/82 | Ht 67.5 in | Wt 180.5 lb

## 2014-07-30 DIAGNOSIS — Z23 Encounter for immunization: Secondary | ICD-10-CM

## 2014-07-30 DIAGNOSIS — Z0000 Encounter for general adult medical examination without abnormal findings: Secondary | ICD-10-CM | POA: Diagnosis not present

## 2014-07-30 MED ORDER — ATENOLOL 50 MG PO TABS: ORAL_TABLET | ORAL | Status: DC

## 2014-07-30 MED ORDER — ENALAPRIL MALEATE 10 MG PO TABS: ORAL_TABLET | ORAL | Status: DC

## 2014-07-30 NOTE — Progress Notes (Signed)
Subjective:    Patient ID: Joshua Alvarez, male    DOB: 12/30/1945, 69 y.o.   MRN: [id number]  HPI AWV- Annual Wellness Visit  The patient was seen for their annual wellness visit. The patient's past medical history, surgical history, and family history were reviewed. Pertinent vaccines were reviewed ( tetanus, pneumonia, shingles, flu) The patient's medication list was reviewed and updated.  The height and weight were entered. The patient's current BMI is: 27.85  Cognitive screening was completed. Outcome of Mini - Cog: passed  Falls within the past 6 months: none  Current tobacco usage: non-smoker (All patients who use tobacco were given written and verbal information on quitting)  Recent listing of emergency department/hospitalizations over the past year were reviewed.  current specialist the patient sees on a regular basis: dermatology, urology    Medicare annual wellness visit patient questionnaire was reviewed.  A written screening schedule for the patient for the next 5-10 years was given. Appropriate discussion of followup regarding next visit was discussed.  Patient has no concerns today.   Results for orders placed or performed in visit on 07/15/14  Lipid panel  Result Value Ref Range   Cholesterol, Total 224 (H) 100 - 199 mg/dL   Triglycerides 189 (H) 0 - 149 mg/dL   HDL 45 >39 mg/dL   VLDL Cholesterol Cal 38 5 - 40 mg/dL   LDL Calculated 141 (H) 0 - 99 mg/dL   Chol/HDL Ratio 5.0 0.0 - 5.0 ratio units  Hepatic function panel  Result Value Ref Range   Total Protein 6.6 6.0 - 8.5 g/dL   Albumin 4.3 3.6 - 4.8 g/dL   Bilirubin Total 0.5 0.0 - 1.2 mg/dL   Bilirubin, Direct 0.07 0.00 - 0.40 mg/dL   Alkaline Phosphatase 95 39 - 117 IU/L   AST 16 0 - 40 IU/L   ALT 13 0 - 44 IU/L  PSA  Result Value Ref Range   PSA 3.9 0.0 - 4.0 ng/mL    exercsising regulaly, eats veggies and meat,  Stopped lipitor felt it was causing muscle pain , six months ago  Still  compliant with bp meds, pt sees urologist regulaly  BP urologist checks regulaly   BP  Pt has been on multile statins that caused troubles   Review of Systems  Constitutional: Negative for fever, activity change and appetite change.  HENT: Negative for congestion and rhinorrhea.   Eyes: Negative for discharge.  Respiratory: Negative for cough and wheezing.   Cardiovascular: Negative for chest pain.  Gastrointestinal: Negative for vomiting, abdominal pain and blood in stool.  Genitourinary: Negative for frequency and difficulty urinating.  Musculoskeletal: Negative for neck pain.  Skin: Negative for rash.  Allergic/Immunologic: Negative for environmental allergies and food allergies.  Neurological: Negative for weakness and headaches.  Psychiatric/Behavioral: Negative for agitation.  All other systems reviewed and are negative.      Objective:   Physical Exam  Constitutional: He appears well-developed and well-nourished.  HENT:  Head: Normocephalic and atraumatic.  Right Ear: External ear normal.  Left Ear: External ear normal.  Nose: Nose normal.  Mouth/Throat: Oropharynx is clear and moist.  Eyes: EOM are normal. Pupils are equal, round, and reactive to light.  Neck: Normal range of motion. Neck supple. No thyromegaly present.  Cardiovascular: Normal rate, regular rhythm and normal heart sounds.   No murmur heard. Pulmonary/Chest: Effort normal and breath sounds normal. No respiratory distress. He has no wheezes.  Abdominal: Soft. Bowel sounds are normal.  He exhibits no distension and no mass. There is no tenderness.  Genitourinary: Prostate normal and penis normal.  Musculoskeletal: Normal range of motion. He exhibits no edema.  Lymphadenopathy:    He has no cervical adenopathy.  Neurological: He is alert. He exhibits normal muscle tone.  Skin: Skin is warm and dry. No erythema.  Psychiatric: He has a normal mood and affect. His behavior is normal. Judgment normal.           Assessment & Plan:  Impression #1 Well adult exam #2 hyperlipidemia unable to take any statins No. 3 up-to-date on colonoscopy plan diet discussed. Exercise discussed. Maintain other medications. Recheck in 6 months. WSL

## 2014-08-07 ENCOUNTER — Encounter: Payer: Self-pay | Admitting: Family Medicine

## 2014-08-07 ENCOUNTER — Ambulatory Visit (INDEPENDENT_AMBULATORY_CARE_PROVIDER_SITE_OTHER): Payer: Medicare Other | Admitting: Family Medicine

## 2014-08-07 VITALS — BP 128/82 | Temp 97.7°F | Ht 67.5 in | Wt 185.6 lb

## 2014-08-07 DIAGNOSIS — J310 Chronic rhinitis: Secondary | ICD-10-CM

## 2014-08-07 DIAGNOSIS — J329 Chronic sinusitis, unspecified: Secondary | ICD-10-CM

## 2014-08-07 MED ORDER — AZITHROMYCIN 250 MG PO TABS: ORAL_TABLET | ORAL | Status: DC

## 2014-08-07 NOTE — Progress Notes (Signed)
   Subjective:    Patient ID: Joshua Alvarez, male    DOB: 1945/12/31, 69 y.o.   MRN: [id number]  Sinus Problem This is a new problem. The current episode started in the past 7 days. Associated symptoms include congestion, coughing, ear pain and a sore throat. (Fever) Treatments tried: claritin.    Feels like cong and had a low gr fever  Felt warm and clammy  Cong and drainage  Ears hurting  Not as bad as in the past  Review of Systems  HENT: Positive for congestion, ear pain and sore throat.   Respiratory: Positive for cough.    No vomiting no diarrhea    Objective:   Physical Exam Alert mild malaise vital stable HEENT frontal maxillary tenderness pharynx erythematous neck normal TMs retracted lungs clear. Heart regular in rhythm.       Assessment & Plan:  Impression acute upper respiratory infection potentially already into sinusitis discussed plan antibiotics prescribed. Symptom care discussed warning signs discussed WSL

## 2014-11-30 ENCOUNTER — Other Ambulatory Visit: Payer: Self-pay | Admitting: Family Medicine

## 2014-12-23 ENCOUNTER — Other Ambulatory Visit: Payer: Self-pay | Admitting: Family Medicine

## 2014-12-28 DIAGNOSIS — N200 Calculus of kidney: Secondary | ICD-10-CM | POA: Diagnosis not present

## 2015-01-08 DIAGNOSIS — D2271 Melanocytic nevi of right lower limb, including hip: Secondary | ICD-10-CM | POA: Diagnosis not present

## 2015-01-08 DIAGNOSIS — L570 Actinic keratosis: Secondary | ICD-10-CM | POA: Diagnosis not present

## 2015-01-08 DIAGNOSIS — D2272 Melanocytic nevi of left lower limb, including hip: Secondary | ICD-10-CM | POA: Diagnosis not present

## 2015-01-08 DIAGNOSIS — Z23 Encounter for immunization: Secondary | ICD-10-CM | POA: Diagnosis not present

## 2015-01-08 DIAGNOSIS — D225 Melanocytic nevi of trunk: Secondary | ICD-10-CM | POA: Diagnosis not present

## 2015-01-08 DIAGNOSIS — Z85828 Personal history of other malignant neoplasm of skin: Secondary | ICD-10-CM | POA: Diagnosis not present

## 2015-02-19 ENCOUNTER — Ambulatory Visit (INDEPENDENT_AMBULATORY_CARE_PROVIDER_SITE_OTHER): Payer: Medicare Other | Admitting: Family Medicine

## 2015-02-19 ENCOUNTER — Encounter: Payer: Self-pay | Admitting: Family Medicine

## 2015-02-19 VITALS — BP 118/82 | Temp 98.0°F | Ht 67.5 in | Wt 181.4 lb

## 2015-02-19 DIAGNOSIS — J329 Chronic sinusitis, unspecified: Secondary | ICD-10-CM | POA: Diagnosis not present

## 2015-02-19 MED ORDER — AMOXICILLIN-POT CLAVULANATE 875-125 MG PO TABS: 1.0000 | ORAL_TABLET | Freq: Two times a day (BID) | ORAL | Status: AC

## 2015-02-19 NOTE — Progress Notes (Signed)
   Subjective:    Patient ID: Joshua Alvarez, male    DOB: 21-Nov-1945, 69 y.o.   MRN: [id number]  Otalgia  There is pain in both ears. This is a new problem. The current episode started in the past 7 days. The problem occurs constantly. The problem has been unchanged. There has been no fever. Associated symptoms include coughing. Treatments tried: Mucinex, Claritin. The treatment provided no relief.   Cough and cong and drainage  Some headache frontla i nature  Cough poroductive, with some fever   Patient states no other concerns this visit.  Review of Systems  HENT: Positive for ear pain.   Respiratory: Positive for cough.    no vomiting or diarrhea     Objective:   Physical Exam  Alert active good hydration HEENT moderate his congestion frontal tenderness pharynx normal TMs retracted lungs clear heart regular in rhythm.      Assessment & Plan:  Impression rhinosinusitis plan antibiotics prescribed. Symptom care discussed warning signs discussed WSL

## 2015-03-19 ENCOUNTER — Other Ambulatory Visit: Payer: Self-pay | Admitting: Family Medicine

## 2015-03-31 ENCOUNTER — Ambulatory Visit (INDEPENDENT_AMBULATORY_CARE_PROVIDER_SITE_OTHER): Payer: Medicare Other | Admitting: Family Medicine

## 2015-03-31 ENCOUNTER — Encounter: Payer: Self-pay | Admitting: Family Medicine

## 2015-03-31 VITALS — BP 124/84 | Temp 97.6°F | Ht 68.0 in | Wt 181.4 lb

## 2015-03-31 DIAGNOSIS — J310 Chronic rhinitis: Secondary | ICD-10-CM

## 2015-03-31 DIAGNOSIS — R509 Fever, unspecified: Secondary | ICD-10-CM

## 2015-03-31 DIAGNOSIS — J329 Chronic sinusitis, unspecified: Secondary | ICD-10-CM

## 2015-03-31 MED ORDER — CEFTRIAXONE SODIUM 1 G IJ SOLR
500.0000 mg | Freq: Once | INTRAMUSCULAR | Status: AC
  Administered 2015-03-31: 500 mg via INTRAMUSCULAR

## 2015-03-31 MED ORDER — LEVOFLOXACIN 500 MG PO TABS: 500.0000 mg | ORAL_TABLET | Freq: Every day | ORAL | Status: DC

## 2015-03-31 NOTE — Progress Notes (Signed)
   Subjective:    Patient ID: Joshua Alvarez, male    DOB: 28-Sep-1945, 69 y.o.   MRN: [id number]  Sinusitis This is a new problem. The current episode started in the past 7 days. Associated symptoms include chills, congestion, coughing, ear pain and a sore throat. (Runny nose, Muscle Aches) Treatments tried: Mucinex D.   patient with several days of head congestion drainage coughing now with sinus pressure pain discomfort denies wheezing denies high fever chills  Patient state no other concerns this visit.  Review of Systems  Constitutional: Positive for chills. Negative for fever and activity change.  HENT: Positive for congestion, ear pain, rhinorrhea and sore throat.   Eyes: Negative for discharge.  Respiratory: Positive for cough. Negative for wheezing.   Cardiovascular: Negative for chest pain.       Objective:   Physical Exam  Constitutional: He appears well-developed.  HENT:  Head: Normocephalic.  Mouth/Throat: Oropharynx is clear and moist. No oropharyngeal exudate.  Neck: Normal range of motion.  Cardiovascular: Normal rate, regular rhythm and normal heart sounds.   No murmur heard. Pulmonary/Chest: Effort normal and breath sounds normal. He has no wheezes.  Lymphadenopathy:    He has no cervical adenopathy.  Neurological: He exhibits normal muscle tone.  Skin: Skin is warm and dry.  Nursing note and vitals reviewed.    probably began as a viral illness secondary rhinosinusitis diagnosis discussed with the patient      Assessment & Plan:   sinusitis antibiotics prescribed warning signs discussed shot of antibiotics given. I don't feel x-rays lab work necessary. Follow-up if problems

## 2015-04-22 ENCOUNTER — Other Ambulatory Visit: Payer: Self-pay | Admitting: Family Medicine

## 2015-04-23 NOTE — Telephone Encounter (Signed)
Ok for three mo, will need chronic ov or pe plus chronic o v by then

## 2015-05-07 ENCOUNTER — Encounter: Payer: Self-pay | Admitting: Family Medicine

## 2015-05-07 ENCOUNTER — Ambulatory Visit (INDEPENDENT_AMBULATORY_CARE_PROVIDER_SITE_OTHER): Payer: Medicare Other | Admitting: Family Medicine

## 2015-05-07 VITALS — BP 102/72 | Ht 67.5 in | Wt 180.0 lb

## 2015-05-07 DIAGNOSIS — J329 Chronic sinusitis, unspecified: Secondary | ICD-10-CM | POA: Diagnosis not present

## 2015-05-07 MED ORDER — CLARITHROMYCIN 500 MG PO TABS: 500.0000 mg | ORAL_TABLET | Freq: Two times a day (BID) | ORAL | Status: AC

## 2015-05-07 NOTE — Progress Notes (Signed)
   Subjective:    Patient ID: Joshua Alvarez, male    DOB: 1946/03/24, 70 y.o.   MRN: [id number]  Sinusitis This is a new problem. The current episode started in the past 7 days. Associated symptoms include chills, coughing, ear pain and a sore throat. (Runny Nose, Muscle aches) Treatments tried: Mucinex, OTC allergy.   Comes and gfoes with the h a, frontal , worse    Patient states has no other concerns this visit. Review of Systems  Constitutional: Positive for chills.  HENT: Positive for ear pain and sore throat.   Respiratory: Positive for cough.        Objective:   Physical Exam  .Alert, mild malaise. Hydration good Vitals stable. frontal/ maxillary tenderness evident positive nasal congestion. pharynx normal neck supple  lungs clear/no crackles or wheezes. heart regular in rhythm       Assessment & Plan:  Impression rhinosinusitis likely post viral, discussed with patient. plan antibiotics prescribed. Questions answered. Symptomatic care discussed. warning signs discussed. WSL

## 2015-06-21 ENCOUNTER — Other Ambulatory Visit: Payer: Self-pay | Admitting: Family Medicine

## 2015-06-25 DIAGNOSIS — D225 Melanocytic nevi of trunk: Secondary | ICD-10-CM | POA: Diagnosis not present

## 2015-06-25 DIAGNOSIS — Z23 Encounter for immunization: Secondary | ICD-10-CM | POA: Diagnosis not present

## 2015-06-25 DIAGNOSIS — L570 Actinic keratosis: Secondary | ICD-10-CM | POA: Diagnosis not present

## 2015-06-25 DIAGNOSIS — D2271 Melanocytic nevi of right lower limb, including hip: Secondary | ICD-10-CM | POA: Diagnosis not present

## 2015-06-25 DIAGNOSIS — D2272 Melanocytic nevi of left lower limb, including hip: Secondary | ICD-10-CM | POA: Diagnosis not present

## 2015-06-25 DIAGNOSIS — Z85828 Personal history of other malignant neoplasm of skin: Secondary | ICD-10-CM | POA: Diagnosis not present

## 2015-06-29 ENCOUNTER — Other Ambulatory Visit: Payer: Self-pay | Admitting: Family Medicine

## 2015-07-05 ENCOUNTER — Encounter: Payer: Self-pay | Admitting: Family Medicine

## 2015-07-05 ENCOUNTER — Ambulatory Visit (INDEPENDENT_AMBULATORY_CARE_PROVIDER_SITE_OTHER): Payer: Medicare Other | Admitting: Family Medicine

## 2015-07-05 VITALS — BP 110/80 | Temp 98.0°F | Ht 67.5 in | Wt 182.0 lb

## 2015-07-05 DIAGNOSIS — B9689 Other specified bacterial agents as the cause of diseases classified elsewhere: Secondary | ICD-10-CM

## 2015-07-05 DIAGNOSIS — J019 Acute sinusitis, unspecified: Secondary | ICD-10-CM | POA: Diagnosis not present

## 2015-07-05 MED ORDER — CEFDINIR 300 MG PO CAPS: 300.0000 mg | ORAL_CAPSULE | Freq: Two times a day (BID) | ORAL | Status: DC

## 2015-07-05 MED ORDER — HYDROCODONE-HOMATROPINE 5-1.5 MG/5ML PO SYRP: 5.0000 mL | ORAL_SOLUTION | Freq: Every evening | ORAL | Status: DC | PRN

## 2015-07-05 NOTE — Progress Notes (Signed)
   Subjective:    Patient ID: Joshua Alvarez, male    DOB: 12-22-1945, 70 y.o.   MRN: [id number]  Sinusitis This is a new problem. The current episode started in the past 7 days. The problem is unchanged. There has been no fever. The pain is moderate. Associated symptoms include coughing, ear pain and headaches. Treatments tried: claritin. The treatment provided no relief.   claritin flonase mucinex allegra prn   No fever  heaache   A lot of cough , with clear phlegm drainage     Both ears  Frontal maxillary fullness and tenderness  Review of Systems  HENT: Positive for ear pain.   Respiratory: Positive for cough.   Neurological: Positive for headaches.       Objective:   Physical Exam  Alert, mild malaise. Hydration good Vitals stable. frontal/ maxillary tenderness evident positive nasal congestion. pharynx normal neck supple  lungs clear/no crackles or wheezes. heart regular in rhythm       Assessment & Plan:  Impression rhinosinusitis likely post viral, discussed with patient. plan antibiotics prescribed. Questions answered. Symptomatic care discussed. warning signs discussed. WSL of note spouse has acute flu but this really looks like sinusitis post viral syndrome

## 2015-07-08 DIAGNOSIS — R972 Elevated prostate specific antigen [PSA]: Secondary | ICD-10-CM | POA: Diagnosis not present

## 2015-07-09 ENCOUNTER — Telehealth: Payer: Self-pay | Admitting: Family Medicine

## 2015-07-09 MED ORDER — OSELTAMIVIR PHOSPHATE 75 MG PO CAPS: 75.0000 mg | ORAL_CAPSULE | Freq: Two times a day (BID) | ORAL | Status: DC

## 2015-07-09 NOTE — Telephone Encounter (Signed)
Patient notified med was sent to pharmacy.  

## 2015-07-09 NOTE — Telephone Encounter (Signed)
Patient was seen 07/05/15 for acute bacterial rhinosinusitis and was given Omnicef 300 mg BID x 10 days and hycodan.

## 2015-07-09 NOTE — Telephone Encounter (Signed)
Add tamiflu 75 bid for five d

## 2015-07-09 NOTE — Telephone Encounter (Signed)
Pt called stating that he has been feeling bad for the past three days. Pt has fever,chills,body aches, and his ears are stopped up.  Pt believes he has the flu and is wanting something to be called in.       [place]

## 2015-07-15 DIAGNOSIS — R3915 Urgency of urination: Secondary | ICD-10-CM | POA: Diagnosis not present

## 2015-07-15 DIAGNOSIS — N200 Calculus of kidney: Secondary | ICD-10-CM | POA: Diagnosis not present

## 2015-07-15 DIAGNOSIS — N401 Enlarged prostate with lower urinary tract symptoms: Secondary | ICD-10-CM | POA: Diagnosis not present

## 2015-07-15 DIAGNOSIS — R972 Elevated prostate specific antigen [PSA]: Secondary | ICD-10-CM | POA: Diagnosis not present

## 2015-07-15 DIAGNOSIS — Z0000 Encounter for general adult medical examination without abnormal findings: Secondary | ICD-10-CM | POA: Diagnosis not present

## 2015-07-22 ENCOUNTER — Other Ambulatory Visit: Payer: Self-pay | Admitting: Family Medicine

## 2015-08-20 ENCOUNTER — Other Ambulatory Visit: Payer: Self-pay | Admitting: Family Medicine

## 2015-08-30 ENCOUNTER — Other Ambulatory Visit: Payer: Self-pay | Admitting: Family Medicine

## 2015-09-10 ENCOUNTER — Encounter: Payer: Self-pay | Admitting: Family Medicine

## 2015-09-10 ENCOUNTER — Ambulatory Visit (INDEPENDENT_AMBULATORY_CARE_PROVIDER_SITE_OTHER): Payer: Medicare Other | Admitting: Family Medicine

## 2015-09-10 ENCOUNTER — Ambulatory Visit (HOSPITAL_COMMUNITY)
Admission: RE | Admit: 2015-09-10 | Discharge: 2015-09-10 | Disposition: A | Discharge status: Home / Self Care | Payer: Medicare Other | Source: Ambulatory Visit | Attending: Family Medicine | Admitting: Family Medicine

## 2015-09-10 VITALS — BP 122/80 | Ht 67.5 in | Wt 180.8 lb

## 2015-09-10 DIAGNOSIS — N200 Calculus of kidney: Secondary | ICD-10-CM | POA: Insufficient documentation

## 2015-09-10 DIAGNOSIS — Z79899 Other long term (current) drug therapy: Secondary | ICD-10-CM | POA: Diagnosis not present

## 2015-09-10 DIAGNOSIS — M545 Low back pain: Secondary | ICD-10-CM | POA: Diagnosis present

## 2015-09-10 DIAGNOSIS — I700 Atherosclerosis of aorta: Secondary | ICD-10-CM | POA: Diagnosis not present

## 2015-09-10 DIAGNOSIS — M5136 Other intervertebral disc degeneration, lumbar region: Secondary | ICD-10-CM | POA: Diagnosis not present

## 2015-09-10 DIAGNOSIS — Z0000 Encounter for general adult medical examination without abnormal findings: Secondary | ICD-10-CM

## 2015-09-10 DIAGNOSIS — E785 Hyperlipidemia, unspecified: Secondary | ICD-10-CM | POA: Diagnosis not present

## 2015-09-10 DIAGNOSIS — Z0189 Encounter for other specified special examinations: Secondary | ICD-10-CM

## 2015-09-10 DIAGNOSIS — I10 Essential (primary) hypertension: Secondary | ICD-10-CM | POA: Diagnosis not present

## 2015-09-10 MED ORDER — ATENOLOL 50 MG PO TABS: ORAL_TABLET | ORAL | Status: DC

## 2015-09-10 MED ORDER — ENALAPRIL MALEATE 10 MG PO TABS: ORAL_TABLET | ORAL | Status: DC

## 2015-09-10 MED ORDER — HYDROCHLOROTHIAZIDE 25 MG PO TABS: ORAL_TABLET | ORAL | Status: DC

## 2015-09-10 NOTE — Progress Notes (Signed)
   Subjective:    Patient ID: Joshua Alvarez, male    DOB: 1945-06-02, 70 y.o.   MRN: [id number]  Hypertension This is a chronic problem. The current episode started more than 1 year ago. Pertinent negatives include no chest pain, headaches or neck pain. Risk factors for coronary artery disease include male gender. Treatments tried: atenolol, vasotec, hctz. There are no compliance problems.    Uses flomax prn , states deftly helps  Uses lodine rn , meds help, can move shoulders beter no w   Staying active and exercising   Nov t2015  Handle new flu shoyt  Two months worth of pain, pain is worselow back pretty intense at times. This knee pain is primarily in the low back. Some radiation into the left leg at times.  When radiation is worse the foot startsFoot dragging , sec to the painOn further history probably no true weakness.   has had problems with the low back over the years, never this long, pos pain in the night ,  Notes has kept form usual activities and walking    Patient attempting to watch his diet., Regular exercise  Review of Systems  Constitutional: Negative for fever, activity change and appetite change.  HENT: Negative for congestion and rhinorrhea.   Eyes: Negative for discharge.  Respiratory: Negative for cough and wheezing.   Cardiovascular: Negative for chest pain.  Gastrointestinal: Negative for vomiting, abdominal pain and blood in stool.  Genitourinary: Negative for frequency and difficulty urinating.  Musculoskeletal: Negative for neck pain.  Skin: Negative for rash.  Allergic/Immunologic: Negative for environmental allergies and food allergies.  Neurological: Negative for weakness and headaches.  Psychiatric/Behavioral: Negative for agitation.       Objective:   Physical Exam  Constitutional: He appears well-developed and well-nourished.  HENT:  Head: Normocephalic and atraumatic.  Right Ear: External ear normal.  Left Ear: External ear normal.    Nose: Nose normal.  Mouth/Throat: Oropharynx is clear and moist.  Eyes: EOM are normal. Pupils are equal, round, and reactive to light.  Neck: Normal range of motion. Neck supple. No thyromegaly present.  Cardiovascular: Normal rate, regular rhythm and normal heart sounds.   No murmur heard. Pulmonary/Chest: Effort normal and breath sounds normal. No respiratory distress. He has no wheezes.  Abdominal: Soft. Bowel sounds are normal. He exhibits no distension and no mass. There is no tenderness.  Genitourinary: Penis normal.  Musculoskeletal: Normal range of motion. He exhibits no edema.  Lymphadenopathy:    He has no cervical adenopathy.  Neurological: He is alert. He exhibits normal muscle tone.  Skin: Skin is warm and dry. No erythema.  Psychiatric: He has a normal mood and affect. His behavior is normal. Judgment normal.   atient notes his prostate exam is provided by his urologist       Assessment & Plan:  Impression 1 welness exam. Up-to-date on colonoscopy. #2 prostate hypertrophy. Flomax working relatively well. #3 subacck pain. Se May well represent arthritis with nerve root irritation. Recommend x-ray discussed#4 hypertension good control discussed#5 hyperlipidemia old blood work reviewed. Plan appropriate blood work. All chronic medications refilled. Diet exercise discussed. Low back x-ray recommended.recheck 6 months WSL

## 2015-09-13 DIAGNOSIS — Z79899 Other long term (current) drug therapy: Secondary | ICD-10-CM | POA: Diagnosis not present

## 2015-09-13 DIAGNOSIS — E785 Hyperlipidemia, unspecified: Secondary | ICD-10-CM | POA: Diagnosis not present

## 2015-09-14 LAB — HEPATIC FUNCTION PANEL
ALT: 14 IU/L (ref 0–44)
AST: 15 IU/L (ref 0–40)
Albumin: 4.3 g/dL (ref 3.6–4.8)
Alkaline Phosphatase: 85 IU/L (ref 39–117)
Bilirubin Total: 0.6 mg/dL (ref 0.0–1.2)
Bilirubin, Direct: 0.16 mg/dL (ref 0.00–0.40)
TOTAL PROTEIN: 6.6 g/dL (ref 6.0–8.5)

## 2015-09-14 LAB — BASIC METABOLIC PANEL
BUN/Creatinine Ratio: 12 (ref 10–24)
BUN: 13 mg/dL (ref 8–27)
CO2: 25 mmol/L (ref 18–29)
CREATININE: 1.07 mg/dL (ref 0.76–1.27)
Calcium: 9.5 mg/dL (ref 8.6–10.2)
Chloride: 100 mmol/L (ref 96–106)
GFR calc Af Amer: 81 mL/min/{1.73_m2} (ref >59)
GFR calc non Af Amer: 70 mL/min/{1.73_m2} (ref >59)
GLUCOSE: 100 mg/dL — AB (ref 65–99)
Potassium: 5 mmol/L (ref 3.5–5.2)
SODIUM: 140 mmol/L (ref 134–144)

## 2015-09-14 LAB — LIPID PANEL
CHOL/HDL RATIO: 5.2 ratio — AB (ref 0.0–5.0)
Cholesterol, Total: 219 mg/dL — ABNORMAL HIGH (ref 100–199)
HDL: 42 mg/dL (ref >39)
LDL Calculated: 137 mg/dL — ABNORMAL HIGH (ref 0–99)
Triglycerides: 202 mg/dL — ABNORMAL HIGH (ref 0–149)
VLDL CHOLESTEROL CAL: 40 mg/dL (ref 5–40)

## 2015-09-19 ENCOUNTER — Encounter: Payer: Self-pay | Admitting: Family Medicine

## 2015-11-20 ENCOUNTER — Other Ambulatory Visit: Payer: Self-pay | Admitting: Family Medicine

## 2015-11-30 ENCOUNTER — Encounter: Payer: Self-pay | Admitting: Family Medicine

## 2015-11-30 ENCOUNTER — Ambulatory Visit (INDEPENDENT_AMBULATORY_CARE_PROVIDER_SITE_OTHER): Payer: Medicare Other | Admitting: Family Medicine

## 2015-11-30 VITALS — BP 112/72 | Temp 97.6°F | Ht 67.5 in | Wt 180.0 lb

## 2015-11-30 DIAGNOSIS — J329 Chronic sinusitis, unspecified: Secondary | ICD-10-CM

## 2015-11-30 MED ORDER — LEVOFLOXACIN 500 MG PO TABS: 500.0000 mg | ORAL_TABLET | Freq: Every day | ORAL | 0 refills | Status: AC

## 2015-11-30 NOTE — Progress Notes (Signed)
   Subjective:    Patient ID: Joshua Alvarez, male    DOB: [birth year], 70 y.o.   MRN: [id number]  Otalgia   This is a new problem. The current episode started in the past 7 days. Associated symptoms include rhinorrhea and a sore throat. Associated symptoms comments: Sinus drainage. Treatments tried: flonase.   No exposure to sickness    Left ear and sinus pressure  Pos pain to swallow   No fever   Diminished energy. Headache primarily left side. No obvious fever. Exposed to grandkids with plenty of viruses an ongoing fashion  Uses advil prn  Pos left ear un    Review of Systems  HENT: Positive for ear pain, rhinorrhea and sore throat.        Objective:   Physical Exam  Alert, mild malaise. Hydration good Vitals stable. frontal/ maxillary tenderness evident positive nasal congestion. pharynx normal neck supple  lungs clear/no crackles or wheezes. heart regular in rhythm  Also left tympanic membrane retracted     Assessment & Plan:  Impression rhinosinusitis likely post viral, discussed with patient. plan antibiotics prescribed. Questions answered. Symptomatic care discussed. warning signs discussed. WSL

## 2015-12-16 DIAGNOSIS — Z85828 Personal history of other malignant neoplasm of skin: Secondary | ICD-10-CM | POA: Diagnosis not present

## 2015-12-16 DIAGNOSIS — L814 Other melanin hyperpigmentation: Secondary | ICD-10-CM | POA: Diagnosis not present

## 2015-12-16 DIAGNOSIS — D2272 Melanocytic nevi of left lower limb, including hip: Secondary | ICD-10-CM | POA: Diagnosis not present

## 2015-12-16 DIAGNOSIS — L570 Actinic keratosis: Secondary | ICD-10-CM | POA: Diagnosis not present

## 2015-12-16 DIAGNOSIS — D2271 Melanocytic nevi of right lower limb, including hip: Secondary | ICD-10-CM | POA: Diagnosis not present

## 2015-12-16 DIAGNOSIS — D225 Melanocytic nevi of trunk: Secondary | ICD-10-CM | POA: Diagnosis not present

## 2015-12-31 ENCOUNTER — Telehealth: Payer: Self-pay | Admitting: Family Medicine

## 2015-12-31 MED ORDER — ATENOLOL 100 MG PO TABS: ORAL_TABLET | ORAL | 0 refills | Status: DC

## 2015-12-31 NOTE — Telephone Encounter (Signed)
It is permissible to use 100 mg and break it in half and just a half a tablet therefore that would be 50 mg a day-May send in prescription with this adjustment

## 2015-12-31 NOTE — Telephone Encounter (Signed)
Pharmacy called stating that Atenolol 50 MG is out of stock and they were wondering if we could send in Atenolol 100 MG and patient take 1/2 tablet. Pharmacy states that they will also soon be out of the Atenolol 100 MG as well if we wanted to send in a different medication in place of the Atenolol. Please advise?

## 2015-12-31 NOTE — Addendum Note (Signed)
Addended by: Ofilia Neas R on: 12/31/2015 10:27 AM   Modules accepted: Orders

## 2016-02-10 ENCOUNTER — Encounter: Payer: Self-pay | Admitting: Family Medicine

## 2016-02-10 ENCOUNTER — Ambulatory Visit (INDEPENDENT_AMBULATORY_CARE_PROVIDER_SITE_OTHER): Payer: Medicare Other | Admitting: Family Medicine

## 2016-02-10 ENCOUNTER — Telehealth: Payer: Self-pay | Admitting: Family Medicine

## 2016-02-10 VITALS — BP 116/82 | Temp 97.7°F | Ht 67.5 in | Wt 177.5 lb

## 2016-02-10 DIAGNOSIS — J329 Chronic sinusitis, unspecified: Secondary | ICD-10-CM | POA: Diagnosis not present

## 2016-02-10 MED ORDER — ALBUTEROL SULFATE HFA 108 (90 BASE) MCG/ACT IN AERS: 2.0000 | INHALATION_SPRAY | Freq: Four times a day (QID) | RESPIRATORY_TRACT | 0 refills | Status: DC | PRN

## 2016-02-10 MED ORDER — LEVOFLOXACIN 500 MG PO TABS: 500.0000 mg | ORAL_TABLET | Freq: Every day | ORAL | 0 refills | Status: DC

## 2016-02-10 NOTE — Progress Notes (Signed)
   Subjective:    Patient ID: Joshua Alvarez, male    DOB: 12/16/1945, 70 y.o.   MRN: [id number]  Sinusitis  This is a new problem. The current episode started 1 to 4 weeks ago. The problem is unchanged. Maximum temperature: low grade fever. The pain is moderate. Associated symptoms include congestion, coughing, sinus pressure and a sore throat. (Sweating) Past treatments include spray decongestants. The treatment provided no relief.   Patient has no other concerns at this time.   Cong and dranage and cough last wd  Started wed and cong and dranage   Review of Systems  HENT: Positive for congestion, sinus pressure and sore throat.   Respiratory: Positive for cough.        Objective:   Physical Exam  Alert, mild malaise. Hydration good Vitals stable. frontal/ maxillary tenderness evident positive nasal congestion. pharynx normal neck supple  lungs clear/no crackles or wheezes. heart regular in rhythm       Assessment & Plan:  Impression rhinosinusitis likely post viral, discussed with patient. plan antibiotics prescribed. Questions answered. Symptomatic care discussed. warning signs discussed. WSL

## 2016-02-10 NOTE — Telephone Encounter (Signed)
Wanting to know if patient needs blood work ordered for appointment in December?

## 2016-02-10 NOTE — Telephone Encounter (Signed)
Patient notified

## 2016-02-10 NOTE — Telephone Encounter (Signed)
no

## 2016-02-14 DIAGNOSIS — R972 Elevated prostate specific antigen [PSA]: Secondary | ICD-10-CM | POA: Diagnosis not present

## 2016-02-21 DIAGNOSIS — R972 Elevated prostate specific antigen [PSA]: Secondary | ICD-10-CM | POA: Diagnosis not present

## 2016-02-24 ENCOUNTER — Telehealth: Payer: Self-pay | Admitting: *Deleted

## 2016-02-24 NOTE — Telephone Encounter (Signed)
Patient's prescription for Proventil HFA inhaler denied. Denial letter states the approval was denied because the patient does not have a diagnosed approved for use of inhaler such as COPD or asthma. Letter in yellow folder

## 2016-02-25 ENCOUNTER — Other Ambulatory Visit: Payer: Self-pay | Admitting: Family Medicine

## 2016-02-27 NOTE — Telephone Encounter (Signed)
Call pt, let him know I think kind of ridiculous his insur co would nlot cover the inhaler is his cough better? Or still a problem? If still a problem, pt may widh to purchas inhaler on his own

## 2016-02-28 NOTE — Telephone Encounter (Signed)
Left message on voicemail to return call.

## 2016-02-28 NOTE — Telephone Encounter (Signed)
Notified patient that inhaler is not covered by insurance. Patient states that he is feeling much better.

## 2016-03-14 ENCOUNTER — Ambulatory Visit: Payer: Medicare Other | Admitting: Family Medicine

## 2016-03-22 ENCOUNTER — Ambulatory Visit (INDEPENDENT_AMBULATORY_CARE_PROVIDER_SITE_OTHER): Payer: Medicare Other | Admitting: Family Medicine

## 2016-03-22 ENCOUNTER — Encounter: Payer: Self-pay | Admitting: Family Medicine

## 2016-03-22 VITALS — BP 128/86 | Ht 67.5 in | Wt 178.0 lb

## 2016-03-22 DIAGNOSIS — I10 Essential (primary) hypertension: Secondary | ICD-10-CM | POA: Diagnosis not present

## 2016-03-22 DIAGNOSIS — Z23 Encounter for immunization: Secondary | ICD-10-CM

## 2016-03-22 MED ORDER — TAMSULOSIN HCL 0.4 MG PO CAPS: 0.4000 mg | ORAL_CAPSULE | Freq: Every day | ORAL | 1 refills | Status: DC

## 2016-03-22 MED ORDER — HYDROCHLOROTHIAZIDE 25 MG PO TABS: 12.5000 mg | ORAL_TABLET | Freq: Every morning | ORAL | 1 refills | Status: DC

## 2016-03-22 MED ORDER — ENALAPRIL MALEATE 10 MG PO TABS: ORAL_TABLET | ORAL | 1 refills | Status: DC

## 2016-03-22 MED ORDER — ATENOLOL 100 MG PO TABS: ORAL_TABLET | ORAL | 1 refills | Status: DC

## 2016-03-22 NOTE — Progress Notes (Signed)
   Subjective:    Patient ID: Joshua Alvarez, male    DOB: 04-29-1945, 70 y.o.   MRN: [id number]  Hypertension  This is a chronic problem. The current episode started more than 1 year ago. Risk factors for coronary artery disease include male gender. Treatments tried: atenolol, enalapril. There are no compliance problems.    psa up due to have a biopsy soon   Will be first one  Blood pressure medicine and blood pressure levels reviewed today with patient. Compliant with blood pressure medicine. States does not miss a dose. No obvious side effects. Blood pressure generally good when checked elsewhere. Watching salt intake.   Exercising reg     Review of Systems No headache, no major weight loss or weight gain, no chest pain no back pain abdominal pain no change in bowel habits complete ROS otherwise negative     Objective:   Physical Exam  Alert vitals stable, NAD. Blood pressure good on repeat. HEENT normal. Lungs clear. Heart regular rate and rhythm.       Assessment & Plan:  Impression 1 hypertension good control discussed maintain same meds plan diet exercise discussed. Medications refilled. Flu shot today

## 2016-03-28 DIAGNOSIS — R972 Elevated prostate specific antigen [PSA]: Secondary | ICD-10-CM | POA: Diagnosis not present

## 2016-05-12 ENCOUNTER — Encounter: Payer: Self-pay | Admitting: Internal Medicine

## 2016-05-24 ENCOUNTER — Other Ambulatory Visit: Payer: Self-pay | Admitting: Family Medicine

## 2016-06-28 DIAGNOSIS — L821 Other seborrheic keratosis: Secondary | ICD-10-CM | POA: Diagnosis not present

## 2016-06-28 DIAGNOSIS — Z85828 Personal history of other malignant neoplasm of skin: Secondary | ICD-10-CM | POA: Diagnosis not present

## 2016-06-28 DIAGNOSIS — D2271 Melanocytic nevi of right lower limb, including hip: Secondary | ICD-10-CM | POA: Diagnosis not present

## 2016-06-28 DIAGNOSIS — D2372 Other benign neoplasm of skin of left lower limb, including hip: Secondary | ICD-10-CM | POA: Diagnosis not present

## 2016-06-28 DIAGNOSIS — D2272 Melanocytic nevi of left lower limb, including hip: Secondary | ICD-10-CM | POA: Diagnosis not present

## 2016-06-28 DIAGNOSIS — L570 Actinic keratosis: Secondary | ICD-10-CM | POA: Diagnosis not present

## 2016-06-28 DIAGNOSIS — R238 Other skin changes: Secondary | ICD-10-CM | POA: Diagnosis not present

## 2016-06-28 DIAGNOSIS — D225 Melanocytic nevi of trunk: Secondary | ICD-10-CM | POA: Diagnosis not present

## 2016-06-28 DIAGNOSIS — Z23 Encounter for immunization: Secondary | ICD-10-CM | POA: Diagnosis not present

## 2016-07-04 ENCOUNTER — Telehealth: Payer: Self-pay | Admitting: Family Medicine

## 2016-07-04 DIAGNOSIS — Z79899 Other long term (current) drug therapy: Secondary | ICD-10-CM

## 2016-07-04 DIAGNOSIS — E785 Hyperlipidemia, unspecified: Secondary | ICD-10-CM

## 2016-07-04 DIAGNOSIS — Z125 Encounter for screening for malignant neoplasm of prostate: Secondary | ICD-10-CM

## 2016-07-04 DIAGNOSIS — I10 Essential (primary) hypertension: Secondary | ICD-10-CM

## 2016-07-04 DIAGNOSIS — R972 Elevated prostate specific antigen [PSA]: Secondary | ICD-10-CM

## 2016-07-04 NOTE — Telephone Encounter (Signed)
Patient wants to know if he is due for labwork for his appointment on 09/28/16 with Dr. Richardson Landry?

## 2016-07-04 NOTE — Telephone Encounter (Signed)
Order now lip liv met 7 psa, but ask pt to wait til a week or so before exam

## 2016-07-04 NOTE — Telephone Encounter (Signed)
Orders put in and mailed to pt as a reminder to do one week before appt in June. Pt notified.

## 2016-08-24 ENCOUNTER — Telehealth: Payer: Self-pay | Admitting: Family Medicine

## 2016-08-24 DIAGNOSIS — Z0000 Encounter for general adult medical examination without abnormal findings: Secondary | ICD-10-CM

## 2016-08-24 DIAGNOSIS — E785 Hyperlipidemia, unspecified: Secondary | ICD-10-CM

## 2016-08-24 DIAGNOSIS — Z125 Encounter for screening for malignant neoplasm of prostate: Secondary | ICD-10-CM

## 2016-08-24 NOTE — Telephone Encounter (Signed)
Patient has physical on 6/25 and needing labs done.

## 2016-08-24 NOTE — Telephone Encounter (Signed)
Lip liv m7 psa 

## 2016-08-24 NOTE — Telephone Encounter (Signed)
Left message return call 08/24/16

## 2016-08-25 NOTE — Telephone Encounter (Signed)
Patient notified

## 2016-08-30 DIAGNOSIS — Z125 Encounter for screening for malignant neoplasm of prostate: Secondary | ICD-10-CM | POA: Diagnosis not present

## 2016-08-30 DIAGNOSIS — E785 Hyperlipidemia, unspecified: Secondary | ICD-10-CM | POA: Diagnosis not present

## 2016-08-30 DIAGNOSIS — Z0000 Encounter for general adult medical examination without abnormal findings: Secondary | ICD-10-CM | POA: Diagnosis not present

## 2016-08-31 LAB — BASIC METABOLIC PANEL
BUN / CREAT RATIO: 11 (ref 10–24)
BUN: 13 mg/dL (ref 8–27)
CALCIUM: 9.7 mg/dL (ref 8.6–10.2)
CO2: 25 mmol/L (ref 18–29)
Chloride: 102 mmol/L (ref 96–106)
Creatinine, Ser: 1.2 mg/dL (ref 0.76–1.27)
GFR calc Af Amer: 70 mL/min/{1.73_m2} (ref >59)
GFR, EST NON AFRICAN AMERICAN: 61 mL/min/{1.73_m2} (ref >59)
Glucose: 101 mg/dL — ABNORMAL HIGH (ref 65–99)
POTASSIUM: 4.9 mmol/L (ref 3.5–5.2)
Sodium: 142 mmol/L (ref 134–144)

## 2016-08-31 LAB — HEPATIC FUNCTION PANEL
ALBUMIN: 4.2 g/dL (ref 3.5–4.8)
ALT: 15 IU/L (ref 0–44)
AST: 19 IU/L (ref 0–40)
Alkaline Phosphatase: 86 IU/L (ref 39–117)
BILIRUBIN, DIRECT: 0.14 mg/dL (ref 0.00–0.40)
Bilirubin Total: 0.5 mg/dL (ref 0.0–1.2)
Total Protein: 6.6 g/dL (ref 6.0–8.5)

## 2016-08-31 LAB — LIPID PANEL
CHOLESTEROL TOTAL: 214 mg/dL — AB (ref 100–199)
Chol/HDL Ratio: 5 ratio (ref 0.0–5.0)
HDL: 43 mg/dL (ref >39)
LDL CALC: 144 mg/dL — AB (ref 0–99)
TRIGLYCERIDES: 136 mg/dL (ref 0–149)
VLDL Cholesterol Cal: 27 mg/dL (ref 5–40)

## 2016-08-31 LAB — PSA: Prostate Specific Ag, Serum: 4.2 ng/mL — ABNORMAL HIGH (ref 0.0–4.0)

## 2016-09-20 ENCOUNTER — Encounter: Payer: Medicare Other | Admitting: Family Medicine

## 2016-09-26 DIAGNOSIS — R972 Elevated prostate specific antigen [PSA]: Secondary | ICD-10-CM | POA: Diagnosis not present

## 2016-09-28 ENCOUNTER — Encounter: Payer: Medicare Other | Admitting: Family Medicine

## 2016-10-02 ENCOUNTER — Ambulatory Visit (INDEPENDENT_AMBULATORY_CARE_PROVIDER_SITE_OTHER): Payer: Medicare Other | Admitting: Family Medicine

## 2016-10-02 VITALS — BP 118/74 | Ht 67.5 in | Wt 175.6 lb

## 2016-10-02 DIAGNOSIS — Z23 Encounter for immunization: Secondary | ICD-10-CM

## 2016-10-02 DIAGNOSIS — I10 Essential (primary) hypertension: Secondary | ICD-10-CM

## 2016-10-02 DIAGNOSIS — R972 Elevated prostate specific antigen [PSA]: Secondary | ICD-10-CM

## 2016-10-02 DIAGNOSIS — Z0000 Encounter for general adult medical examination without abnormal findings: Secondary | ICD-10-CM | POA: Diagnosis not present

## 2016-10-02 DIAGNOSIS — E785 Hyperlipidemia, unspecified: Secondary | ICD-10-CM

## 2016-10-02 MED ORDER — HYDROCHLOROTHIAZIDE 25 MG PO TABS: 12.5000 mg | ORAL_TABLET | Freq: Every morning | ORAL | 1 refills | Status: DC

## 2016-10-02 MED ORDER — ENALAPRIL MALEATE 10 MG PO TABS: ORAL_TABLET | ORAL | 1 refills | Status: DC

## 2016-10-02 MED ORDER — ATENOLOL 100 MG PO TABS: ORAL_TABLET | ORAL | 1 refills | Status: DC

## 2016-10-02 NOTE — Progress Notes (Signed)
Subjective:    Patient ID: Joshua Alvarez, male    DOB: 10-30-1945, 71 y.o.   MRN: [id number]  HPI  AWV- Annual Wellness Visit  The patient was seen for their annual wellness visit. The patient's past medical history, surgical history, and family history were reviewed. Pertinent vaccines were reviewed ( tetanus, pneumonia, shingles, flu) The patient's medication list was reviewed and updated.  The height and weight were entered. The patient's current BMI is:27.2  Cognitive screening was completed. Outcome of Mini - Cog: pass  Falls within the past 6 months:none  Current tobacco usage: none (All patients who use tobacco were given written and verbal information on quitting)  Recent listing of emergency department/hospitalizations over the past year were reviewed.  current specialist the patient sees on a regular basis: urology and dermatology  Patient reports ringing in his ears  Medicare annual wellness visit patient questionnaire was reviewed.  A written screening schedule for the patient for the next 5-10 years was given. Appropriate discussion of followup regarding next visit was discussed.  Results for orders placed or performed in visit on 08/24/16  Lipid panel  Result Value Ref Range   Cholesterol, Total 214 (H) 100 - 199 mg/dL   Triglycerides 136 0 - 149 mg/dL   HDL 43 >39 mg/dL   VLDL Cholesterol Cal 27 5 - 40 mg/dL   LDL Calculated 144 (H) 0 - 99 mg/dL   Chol/HDL Ratio 5.0 0.0 - 5.0 ratio  Hepatic function panel  Result Value Ref Range   Total Protein 6.6 6.0 - 8.5 g/dL   Albumin 4.2 3.5 - 4.8 g/dL   Bilirubin Total 0.5 0.0 - 1.2 mg/dL   Bilirubin, Direct 0.14 0.00 - 0.40 mg/dL   Alkaline Phosphatase 86 39 - 117 IU/L   AST 19 0 - 40 IU/L   ALT 15 0 - 44 IU/L  Basic metabolic panel  Result Value Ref Range   Glucose 101 (H) 65 - 99 mg/dL   BUN 13 8 - 27 mg/dL   Creatinine, Ser 1.20 0.76 - 1.27 mg/dL   GFR calc non Af Amer 61 >59 mL/min/1.73   GFR calc  Af Amer 70 >59 mL/min/1.73   BUN/Creatinine Ratio 11 10 - 24   Sodium 142 134 - 144 mmol/L   Potassium 4.9 3.5 - 5.2 mmol/L   Chloride 102 96 - 106 mmol/L   CO2 25 18 - 29 mmol/L   Calcium 9.7 8.6 - 10.2 mg/dL  PSA  Result Value Ref Range   Prostate Specific Ag, Serum 4.2 (H) 0.0 - 4.0 ng/mL    Definitely higher stress the last week or so and there fore more fatigued and tired    .Blood pressure medicine and blood pressure levels reviewed today with patient. Compliant with blood pressure medicine. States does not miss a dose. No obvious side effects. Blood pressure generally good when checked elsewhere. Watching salt intake.  Working on diet pretty closely  Last colon 2015, next one due in three yrs   Urologist following for elev psa, has had one set      Review of Systems  Constitutional: Negative for activity change, appetite change and fever.  HENT: Negative for congestion and rhinorrhea.   Eyes: Negative for discharge.  Respiratory: Negative for cough and wheezing.   Cardiovascular: Negative for chest pain.  Gastrointestinal: Negative for abdominal pain, blood in stool and vomiting.  Genitourinary: Negative for difficulty urinating and frequency.  Musculoskeletal: Negative for neck pain.  Skin:  Negative for rash.  Allergic/Immunologic: Negative for environmental allergies and food allergies.  Neurological: Negative for weakness and headaches.  Psychiatric/Behavioral: Negative for agitation.  All other systems reviewed and are negative.      Objective:   Physical Exam  Constitutional: He appears well-developed and well-nourished.  HENT:  Head: Normocephalic and atraumatic.  Right Ear: External ear normal.  Left Ear: External ear normal.  Nose: Nose normal.  Mouth/Throat: Oropharynx is clear and moist.  Eyes: EOM are normal. Pupils are equal, round, and reactive to light.  Neck: Normal range of motion. Neck supple. No thyromegaly present.  Cardiovascular:  Normal rate, regular rhythm and normal heart sounds.   No murmur heard. Pulmonary/Chest: Effort normal and breath sounds normal. No respiratory distress. He has no wheezes.  Abdominal: Soft. Bowel sounds are normal. He exhibits no distension and no mass. There is no tenderness.  Genitourinary: Penis normal.  Musculoskeletal: Normal range of motion. He exhibits no edema.  Lymphadenopathy:    He has no cervical adenopathy.  Neurological: He is alert. He exhibits normal muscle tone.  Skin: Skin is warm and dry. No erythema.  Psychiatric: He has a normal mood and affect. His behavior is normal. Judgment normal.  Vitals reviewed.         Assessment & Plan:  Impression #1 wellness exam. Up-to-date on colonoscopy. Needs Pneumovax 23 discussed. Diet exercise discussed. #2 hypertension good control discussed maintain same meds hyperlipidemia discussed cannot handle statins patient work harder on diet. Elevated PSA. Patient is aware of this. He will be seen urologist shortly follows him chronically for this/medications refilled recheck in 6 months

## 2016-10-02 NOTE — Patient Instructions (Signed)
There is a class of medicines that can help the urinating, but in some men can increase the risk of risk of prostate cancer, which I do not recommend. You cannot handle the flomax, ask your urology dr if he has any other ideas that are safe fror yoo

## 2016-10-03 DIAGNOSIS — R972 Elevated prostate specific antigen [PSA]: Secondary | ICD-10-CM | POA: Diagnosis not present

## 2016-12-08 DIAGNOSIS — H2513 Age-related nuclear cataract, bilateral: Secondary | ICD-10-CM | POA: Diagnosis not present

## 2016-12-15 DIAGNOSIS — D2372 Other benign neoplasm of skin of left lower limb, including hip: Secondary | ICD-10-CM | POA: Diagnosis not present

## 2016-12-15 DIAGNOSIS — D2271 Melanocytic nevi of right lower limb, including hip: Secondary | ICD-10-CM | POA: Diagnosis not present

## 2016-12-15 DIAGNOSIS — Z85828 Personal history of other malignant neoplasm of skin: Secondary | ICD-10-CM | POA: Diagnosis not present

## 2016-12-15 DIAGNOSIS — D225 Melanocytic nevi of trunk: Secondary | ICD-10-CM | POA: Diagnosis not present

## 2016-12-15 DIAGNOSIS — D2272 Melanocytic nevi of left lower limb, including hip: Secondary | ICD-10-CM | POA: Diagnosis not present

## 2016-12-15 DIAGNOSIS — Z23 Encounter for immunization: Secondary | ICD-10-CM | POA: Diagnosis not present

## 2016-12-15 DIAGNOSIS — L821 Other seborrheic keratosis: Secondary | ICD-10-CM | POA: Diagnosis not present

## 2016-12-27 ENCOUNTER — Ambulatory Visit (INDEPENDENT_AMBULATORY_CARE_PROVIDER_SITE_OTHER): Payer: Medicare Other | Admitting: Nurse Practitioner

## 2016-12-27 ENCOUNTER — Encounter: Payer: Self-pay | Admitting: Nurse Practitioner

## 2016-12-27 VITALS — BP 132/88 | Temp 97.8°F | Ht 67.5 in | Wt 174.0 lb

## 2016-12-27 DIAGNOSIS — J0120 Acute ethmoidal sinusitis, unspecified: Secondary | ICD-10-CM

## 2016-12-27 MED ORDER — AMOXICILLIN-POT CLAVULANATE 875-125 MG PO TABS: 1.0000 | ORAL_TABLET | Freq: Two times a day (BID) | ORAL | 0 refills | Status: DC

## 2016-12-27 NOTE — Patient Instructions (Signed)
Nasacort AQ Mucinex

## 2016-12-27 NOTE — Progress Notes (Signed)
Subjective:  Presents for c/o sinus pressure x 10 d. No fever or sore throat. Ethmoid sinus pressure. Runny nose and head congestion. Occasional cough, sometimes producing mucus with color. Yellowish drainage. Ear pressure. No wheezing.   Objective:   BP 132/88   Temp 97.8 F (36.6 C) (Oral)   Ht 5' 7.5" (1.715 m)   Wt 174 lb (78.9 kg)   BMI 26.85 kg/m  NAD. Alert, oriented. TMs retracted, no erythema. Pharynx injected with PND noted. Neck supple with mild anterior adenopathy. Lungs clear. Heart RRR.   Assessment:  Acute non-recurrent ethmoidal sinusitis    Plan:   Meds ordered this encounter  Medications  . amoxicillin-clavulanate (AUGMENTIN) 875-125 MG tablet    Sig: Take 1 tablet by mouth 2 (two) times daily.    Dispense:  20 tablet    Refill:  0    Order Specific Question:   Supervising Provider    Answer:   Mikey Kirschner [[id number]]   Mucinex and Nasacort AQ as directed. Warning signs reviewed. Call back in 5-7 days if no improvement, sooner if worse.

## 2017-02-24 ENCOUNTER — Other Ambulatory Visit: Payer: Self-pay | Admitting: Family Medicine

## 2017-03-15 ENCOUNTER — Other Ambulatory Visit: Payer: Self-pay

## 2017-03-22 DIAGNOSIS — R972 Elevated prostate specific antigen [PSA]: Secondary | ICD-10-CM | POA: Diagnosis not present

## 2017-03-28 ENCOUNTER — Telehealth: Payer: Self-pay | Admitting: Family Medicine

## 2017-03-28 DIAGNOSIS — E785 Hyperlipidemia, unspecified: Secondary | ICD-10-CM

## 2017-03-28 DIAGNOSIS — I10 Essential (primary) hypertension: Secondary | ICD-10-CM

## 2017-03-28 DIAGNOSIS — Z125 Encounter for screening for malignant neoplasm of prostate: Secondary | ICD-10-CM

## 2017-03-28 NOTE — Telephone Encounter (Signed)
Rep all same 

## 2017-03-28 NOTE — Telephone Encounter (Signed)
Pt is requesting lab orders to be sent over. Last labs per epic were: psa,bmp,hepatic,and lipid on 08/30/16.

## 2017-03-29 DIAGNOSIS — N401 Enlarged prostate with lower urinary tract symptoms: Secondary | ICD-10-CM | POA: Diagnosis not present

## 2017-03-29 DIAGNOSIS — I10 Essential (primary) hypertension: Secondary | ICD-10-CM | POA: Diagnosis not present

## 2017-03-29 DIAGNOSIS — R3911 Hesitancy of micturition: Secondary | ICD-10-CM | POA: Diagnosis not present

## 2017-03-29 DIAGNOSIS — Z125 Encounter for screening for malignant neoplasm of prostate: Secondary | ICD-10-CM | POA: Diagnosis not present

## 2017-03-29 DIAGNOSIS — R972 Elevated prostate specific antigen [PSA]: Secondary | ICD-10-CM | POA: Diagnosis not present

## 2017-03-29 DIAGNOSIS — E785 Hyperlipidemia, unspecified: Secondary | ICD-10-CM | POA: Diagnosis not present

## 2017-03-29 NOTE — Telephone Encounter (Signed)
Blood work ordered in Epic. Patient notified. 

## 2017-03-30 LAB — PSA: PROSTATE SPECIFIC AG, SERUM: 4.3 ng/mL — AB (ref 0.0–4.0)

## 2017-03-30 LAB — BASIC METABOLIC PANEL
BUN / CREAT RATIO: 12 (ref 10–24)
BUN: 13 mg/dL (ref 8–27)
CHLORIDE: 102 mmol/L (ref 96–106)
CO2: 26 mmol/L (ref 20–29)
CREATININE: 1.12 mg/dL (ref 0.76–1.27)
Calcium: 9.8 mg/dL (ref 8.6–10.2)
GFR calc Af Amer: 77 mL/min/{1.73_m2} (ref >59)
GFR calc non Af Amer: 66 mL/min/{1.73_m2} (ref >59)
GLUCOSE: 89 mg/dL (ref 65–99)
Potassium: 5 mmol/L (ref 3.5–5.2)
SODIUM: 141 mmol/L (ref 134–144)

## 2017-03-30 LAB — HEPATIC FUNCTION PANEL
ALT: 11 IU/L (ref 0–44)
AST: 19 IU/L (ref 0–40)
Albumin: 4.4 g/dL (ref 3.5–4.8)
Alkaline Phosphatase: 97 IU/L (ref 39–117)
BILIRUBIN, DIRECT: 0.15 mg/dL (ref 0.00–0.40)
Bilirubin Total: 0.6 mg/dL (ref 0.0–1.2)
TOTAL PROTEIN: 7.1 g/dL (ref 6.0–8.5)

## 2017-03-30 LAB — LIPID PANEL
CHOL/HDL RATIO: 4.9 ratio (ref 0.0–5.0)
Cholesterol, Total: 217 mg/dL — ABNORMAL HIGH (ref 100–199)
HDL: 44 mg/dL (ref >39)
LDL Calculated: 123 mg/dL — ABNORMAL HIGH (ref 0–99)
TRIGLYCERIDES: 248 mg/dL — AB (ref 0–149)
VLDL CHOLESTEROL CAL: 50 mg/dL — AB (ref 5–40)

## 2017-04-05 ENCOUNTER — Encounter: Payer: Self-pay | Admitting: Family Medicine

## 2017-04-05 ENCOUNTER — Ambulatory Visit (INDEPENDENT_AMBULATORY_CARE_PROVIDER_SITE_OTHER): Payer: Medicare Other | Admitting: Family Medicine

## 2017-04-05 VITALS — BP 130/108 | Ht 67.5 in

## 2017-04-05 DIAGNOSIS — I10 Essential (primary) hypertension: Secondary | ICD-10-CM | POA: Diagnosis not present

## 2017-04-05 DIAGNOSIS — Z23 Encounter for immunization: Secondary | ICD-10-CM

## 2017-04-05 MED ORDER — HYDROCHLOROTHIAZIDE 25 MG PO TABS: 12.5000 mg | ORAL_TABLET | Freq: Every morning | ORAL | 1 refills | Status: DC

## 2017-04-05 MED ORDER — ATENOLOL 100 MG PO TABS: ORAL_TABLET | ORAL | 1 refills | Status: DC

## 2017-04-05 MED ORDER — ENALAPRIL MALEATE 10 MG PO TABS: ORAL_TABLET | ORAL | 1 refills | Status: DC

## 2017-04-05 NOTE — Progress Notes (Signed)
   Subjective:    Patient ID: Joshua Alvarez, male    DOB: 10-29-1945, 71 y.o.   MRN: [id number]  HPI            Blood pressure medicine and blood pressure levels reviewed today with patient. Compliant with blood pressure medicine. States does not miss a dose. No obvious side effects. Blood pressure generally good when checked elsewhere. Watching salt intake.  Results for orders placed or performed in visit on 03/28/17  Lipid panel  Result Value Ref Range   Cholesterol, Total 217 (H) 100 - 199 mg/dL   Triglycerides 248 (H) 0 - 149 mg/dL   HDL 44 >39 mg/dL   VLDL Cholesterol Cal 50 (H) 5 - 40 mg/dL   LDL Calculated 123 (H) 0 - 99 mg/dL   Chol/HDL Ratio 4.9 0.0 - 5.0 ratio  Hepatic function panel  Result Value Ref Range   Total Protein 7.1 6.0 - 8.5 g/dL   Albumin 4.4 3.5 - 4.8 g/dL   Bilirubin Total 0.6 0.0 - 1.2 mg/dL   Bilirubin, Direct 0.15 0.00 - 0.40 mg/dL   Alkaline Phosphatase 97 39 - 117 IU/L   AST 19 0 - 40 IU/L   ALT 11 0 - 44 IU/L  Basic metabolic panel  Result Value Ref Range   Glucose 89 65 - 99 mg/dL   BUN 13 8 - 27 mg/dL   Creatinine, Ser 1.12 0.76 - 1.27 mg/dL   GFR calc non Af Amer 66 >59 mL/min/1.73   GFR calc Af Amer 77 >59 mL/min/1.73   BUN/Creatinine Ratio 12 10 - 24   Sodium 141 134 - 144 mmol/L   Potassium 5.0 3.5 - 5.2 mmol/L   Chloride 102 96 - 106 mmol/L   CO2 26 20 - 29 mmol/L   Calcium 9.8 8.6 - 10.2 mg/dL  PSA  Result Value Ref Range   Prostate Specific Ag, Serum 4.3 (H) 0.0 - 4.0 ng/mL  Has not illness well no history for 2-3 days urology extraction 20 much right check a pulses in her feet think likely it is  Neg prost biopsy    Objective:   Physical Exam Alert vitals stable, NAD. Blood pressure good on repeat. HEENT normal. Lungs clear. Heart regular rate and rhythm.   Impression hypertension good control discussed maintain same treatment.  Meds refilled.  Flu shot today       Assessment & Plan:

## 2017-04-25 ENCOUNTER — Encounter: Payer: Self-pay | Admitting: Family Medicine

## 2017-04-25 ENCOUNTER — Ambulatory Visit (INDEPENDENT_AMBULATORY_CARE_PROVIDER_SITE_OTHER): Payer: Medicare Other | Admitting: Family Medicine

## 2017-04-25 VITALS — BP 120/84 | Temp 98.2°F | Ht 67.5 in | Wt 175.0 lb

## 2017-04-25 DIAGNOSIS — J329 Chronic sinusitis, unspecified: Secondary | ICD-10-CM

## 2017-04-25 DIAGNOSIS — J310 Chronic rhinitis: Secondary | ICD-10-CM

## 2017-04-25 MED ORDER — CEFPROZIL 500 MG PO TABS: 500.0000 mg | ORAL_TABLET | Freq: Two times a day (BID) | ORAL | 0 refills | Status: DC

## 2017-04-25 NOTE — Progress Notes (Signed)
   Subjective:    Patient ID: Joshua Alvarez, male    DOB: 08-21-1945, 72 y.o.   MRN: [id number]  HPI Patient is here today states he has sinus pressure. Bilateral ear pain and they feel stopped up.Sore throat,productive cough, headache.Symtpoms started yesterday. He has been taking Tylenol which helped some.  Pt has had a cough for the past  Week  yest struck har, sore thoat, ears and sinuses   Frontal ressure and cong ad headache frontal     Review of Systems No headache, no major weight loss or weight gain, no chest pain no back pain abdominal pain no change in bowel habits complete ROS otherwise negative     Objective:   Physical Exam  Alert, mild malaise. Hydration good Vitals stable. frontal/ maxillary tenderness evident positive nasal congestion. pharynx normal neck supple  lungs clear/no crackles or wheezes. heart regular in rhythm       Assessment & Plan:  Impression rhinosinusitis likely post viral, discussed with patient. plan antibiotics prescribed. Questions answered. Symptomatic care discussed. warning signs discussed. WSL

## 2017-05-03 ENCOUNTER — Other Ambulatory Visit: Payer: Self-pay | Admitting: *Deleted

## 2017-05-03 ENCOUNTER — Telehealth: Payer: Self-pay | Admitting: Family Medicine

## 2017-05-03 MED ORDER — AMOXICILLIN-POT CLAVULANATE 875-125 MG PO TABS: 1.0000 | ORAL_TABLET | Freq: Two times a day (BID) | ORAL | 0 refills | Status: DC

## 2017-05-03 NOTE — Telephone Encounter (Signed)
I recommend Augmentin 875 mg 1 twice daily for 10 days please explained to the patient that this is a very strong form of amoxicillin with clavulanate therefore should do the job if ongoing troubles follow-up.  Stop cefzil

## 2017-05-03 NOTE — Telephone Encounter (Signed)
Patient seen Dr. Richardson Landry on 04/25/17 for rhinonsinusitis.  He was prescribed cefzil.  He is still having sinus drainage, cough and ear pain and would like to see about getting the antibiotic changed?   [place]

## 2017-05-03 NOTE — Telephone Encounter (Signed)
Discussed with pt. Pt verbalized understanding. Med sent to pharm.  

## 2017-08-06 ENCOUNTER — Encounter (INDEPENDENT_AMBULATORY_CARE_PROVIDER_SITE_OTHER): Payer: Self-pay

## 2017-08-06 ENCOUNTER — Encounter: Payer: Self-pay | Admitting: Family Medicine

## 2017-08-06 ENCOUNTER — Ambulatory Visit (INDEPENDENT_AMBULATORY_CARE_PROVIDER_SITE_OTHER): Payer: Medicare Other | Admitting: Family Medicine

## 2017-08-06 VITALS — BP 134/90 | Temp 97.5°F | Ht 67.5 in | Wt 177.6 lb

## 2017-08-06 DIAGNOSIS — J301 Allergic rhinitis due to pollen: Secondary | ICD-10-CM | POA: Diagnosis not present

## 2017-08-06 DIAGNOSIS — H6123 Impacted cerumen, bilateral: Secondary | ICD-10-CM

## 2017-08-06 MED ORDER — METHYLPREDNISOLONE ACETATE 40 MG/ML IJ SUSP
40.0000 mg | Freq: Once | INTRAMUSCULAR | Status: AC
  Administered 2017-08-06: 40 mg via INTRAMUSCULAR

## 2017-08-06 NOTE — Progress Notes (Signed)
   Subjective:    Patient ID: Joshua Alvarez, male    DOB: [birth year], 72 y.o.   MRN: [id number]  Ear Fullness   There is pain in both ears. This is a new problem. The current episode started 1 to 4 weeks ago. Associated symptoms comments: Ear pressure, cant hear well. Treatments tried: Benadryl    Notes both ears  Some allergies over the yrs  Allergies more challenging this spring.  States compliant with antihistamine plus steroid nasal spray.  Has never had to get ears washed out    Review of Systems No headache, no major weight loss or weight gain, no chest pain no back pain abdominal pain no change in bowel habits complete ROS otherwise negative     Objective:   Physical Exam  Alert vitals stable, NAD. Blood pressure good on repeat. HEENT impression 1 allergic rhinitis  2.  Mild nasal congestion plus bilateral cerumen impaction otherwise normal. Lungs clear. Heart regular rate and rhythm.       Assessment & Plan:  Bilateral cerumen obstruction  need of intervention discussed  2.  Allergic rhinitis we will add Depo-Medrol rationale discussed

## 2017-08-29 DIAGNOSIS — H838X3 Other specified diseases of inner ear, bilateral: Secondary | ICD-10-CM | POA: Diagnosis not present

## 2017-08-29 DIAGNOSIS — H6123 Impacted cerumen, bilateral: Secondary | ICD-10-CM | POA: Diagnosis not present

## 2017-08-29 DIAGNOSIS — H903 Sensorineural hearing loss, bilateral: Secondary | ICD-10-CM | POA: Diagnosis not present

## 2017-09-12 ENCOUNTER — Telehealth: Payer: Self-pay | Admitting: Family Medicine

## 2017-09-12 NOTE — Telephone Encounter (Signed)
Patient has an appointment on 10/10/17 with Dr. Richardson Landry.  He wants to know if he is due for labs?

## 2017-09-12 NOTE — Telephone Encounter (Signed)
Last labs in December PSA, BMET, LIPID, LIVER

## 2017-09-13 NOTE — Telephone Encounter (Signed)
I called and left a message to r/c. 

## 2017-09-13 NOTE — Telephone Encounter (Signed)
No, that appt should be wellness plus chronic

## 2017-09-13 NOTE — Telephone Encounter (Signed)
Patient is aware 

## 2017-09-27 DIAGNOSIS — R972 Elevated prostate specific antigen [PSA]: Secondary | ICD-10-CM | POA: Diagnosis not present

## 2017-10-04 ENCOUNTER — Encounter: Payer: Medicare Other | Admitting: Family Medicine

## 2017-10-10 ENCOUNTER — Ambulatory Visit (INDEPENDENT_AMBULATORY_CARE_PROVIDER_SITE_OTHER): Payer: Medicare Other | Admitting: Family Medicine

## 2017-10-10 ENCOUNTER — Encounter: Payer: Self-pay | Admitting: Family Medicine

## 2017-10-10 VITALS — BP 118/90 | Ht 67.5 in | Wt 174.0 lb

## 2017-10-10 DIAGNOSIS — I10 Essential (primary) hypertension: Secondary | ICD-10-CM | POA: Diagnosis not present

## 2017-10-10 DIAGNOSIS — Z0000 Encounter for general adult medical examination without abnormal findings: Secondary | ICD-10-CM

## 2017-10-10 MED ORDER — HYDROCHLOROTHIAZIDE 25 MG PO TABS: 12.5000 mg | ORAL_TABLET | Freq: Every morning | ORAL | 1 refills | Status: DC

## 2017-10-10 MED ORDER — ATENOLOL 100 MG PO TABS: ORAL_TABLET | ORAL | 1 refills | Status: DC

## 2017-10-10 MED ORDER — ALBUTEROL SULFATE HFA 108 (90 BASE) MCG/ACT IN AERS: 2.0000 | INHALATION_SPRAY | Freq: Four times a day (QID) | RESPIRATORY_TRACT | 0 refills | Status: DC | PRN

## 2017-10-10 MED ORDER — ENALAPRIL MALEATE 10 MG PO TABS: ORAL_TABLET | ORAL | 1 refills | Status: DC

## 2017-10-10 NOTE — Progress Notes (Signed)
Subjective:    Patient ID: Joshua Alvarez, male    DOB: [birth year], 72 y.o.   MRN: [id number]  HPI AWV- Annual Wellness Visit  The patient was seen for their annual wellness visit. The patient's past medical history, surgical history, and family history were reviewed. Pertinent vaccines were reviewed ( tetanus, pneumonia, shingles, flu) The patient's medication list was reviewed and updated.  The height and weight were entered.  BMI recorded in electronic record elsewhere  Cognitive screening was completed. Outcome of Mini - Cog: passed   Falls /depression screening electronically recorded within record elsewhere No falls  Current tobacco usage:No (All patients who use tobacco were given written and verbal information on quitting)  Recent listing of emergency department/hospitalizations over the past year were reviewed.  current specialist the patient sees on a regular basis: Sees Urologist,Dermatologist, Eye Dr.GI Dr.   [facility] So annual wellness visit patient questionnaire was reviewed.  A written screening schedule for the patient for the next 5-10 years was given. Appropriate discussion of followup regarding next visit was discussed.   He states he will see his urologist on Monday coming and does not need the prostate checked here today.  Blood pressure medicine and blood pressure levels reviewed today with patient. Compliant with blood pressure medicine. States does not miss a dose. No obvious side effects. Blood pressure generally good when checked elsewhere. Watching salt intake.  Results for orders placed or performed in visit on 03/28/17  Lipid panel  Result Value Ref Range   Cholesterol, Total 217 (H) 100 - 199 mg/dL   Triglycerides 248 (H) 0 - 149 mg/dL   HDL 44 >39 mg/dL   VLDL Cholesterol Cal 50 (H) 5 - 40 mg/dL   LDL Calculated 123 (H) 0 - 99 mg/dL   Chol/HDL Ratio 4.9 0.0 - 5.0 ratio  Hepatic function panel  Result Value Ref Range   Total Protein 7.1 6.0  - 8.5 g/dL   Albumin 4.4 3.5 - 4.8 g/dL   Bilirubin Total 0.6 0.0 - 1.2 mg/dL   Bilirubin, Direct 0.15 0.00 - 0.40 mg/dL   Alkaline Phosphatase 97 39 - 117 IU/L   AST 19 0 - 40 IU/L   ALT 11 0 - 44 IU/L  Basic metabolic panel  Result Value Ref Range   Glucose 89 65 - 99 mg/dL   BUN 13 8 - 27 mg/dL   Creatinine, Ser 1.12 0.76 - 1.27 mg/dL   GFR calc non Af Amer 66 >59 mL/min/1.73   GFR calc Af Amer 77 >59 mL/min/1.73   BUN/Creatinine Ratio 12 10 - 24   Sodium 141 134 - 144 mmol/L   Potassium 5.0 3.5 - 5.2 mmol/L   Chloride 102 96 - 106 mmol/L   CO2 26 20 - 29 mmol/L   Calcium 9.8 8.6 - 10.2 mg/dL  PSA  Result Value Ref Range   Prostate Specific Ag, Serum 4.3 (H) 0.0 - 4.0 ng/mL   Does fl sht and pneum shot     f u urolgist , due to ee this moday   Review of Systems  Constitutional: Negative for activity change, appetite change and fever.  HENT: Negative for congestion and rhinorrhea.   Eyes: Negative for discharge.  Respiratory: Negative for cough and wheezing.   Cardiovascular: Negative for chest pain.  Gastrointestinal: Negative for abdominal pain, blood in stool and vomiting.  Genitourinary: Negative for difficulty urinating and frequency.  Musculoskeletal: Negative for neck pain.  Skin: Negative for rash.  Allergic/Immunologic: Negative  for environmental allergies and food allergies.  Neurological: Negative for weakness and headaches.  Psychiatric/Behavioral: Negative for agitation.  All other systems reviewed and are negative.      Objective:   Physical Exam  Constitutional: He appears well-developed and well-nourished.  HENT:  Head: Normocephalic and atraumatic.  Right Ear: External ear normal.  Left Ear: External ear normal.  Nose: Nose normal.  Mouth/Throat: Oropharynx is clear and moist.  Eyes: Pupils are equal, round, and reactive to light. EOM are normal.  Neck: Normal range of motion. Neck supple. No thyromegaly present.  Cardiovascular: Normal  rate, regular rhythm and normal heart sounds.  No murmur heard. Pulmonary/Chest: Effort normal and breath sounds normal. No respiratory distress. He has no wheezes.  Abdominal: Soft. Bowel sounds are normal. He exhibits no distension and no mass. There is no tenderness.  Genitourinary: Penis normal.  Musculoskeletal: Normal range of motion. He exhibits no edema.  Lymphadenopathy:    He has no cervical adenopathy.  Neurological: He is alert. He exhibits normal muscle tone.  Skin: Skin is warm and dry. No erythema.  Psychiatric: He has a normal mood and affect. His behavior is normal. Judgment normal.  Vitals reviewed.         Assessment & Plan:  Colon done 2015 next due in 7 per pt impression wellness exam.  Patient declines prostate exam since getting one shortly from urologist which is certainly appropriate.  Diet discussed.  Exercise discussed.  Vaccines discussed.  2.  Hypertension.  Good control discussed maintain same meds compliance discussed appropriate diet for this discussed  Blood work discussed.  Follow-up in 6 months

## 2017-10-15 DIAGNOSIS — N529 Male erectile dysfunction, unspecified: Secondary | ICD-10-CM | POA: Diagnosis not present

## 2017-10-15 DIAGNOSIS — R972 Elevated prostate specific antigen [PSA]: Secondary | ICD-10-CM | POA: Diagnosis not present

## 2017-12-24 ENCOUNTER — Ambulatory Visit (INDEPENDENT_AMBULATORY_CARE_PROVIDER_SITE_OTHER): Payer: Medicare Other | Admitting: Family Medicine

## 2017-12-24 ENCOUNTER — Encounter: Payer: Self-pay | Admitting: Family Medicine

## 2017-12-24 VITALS — BP 128/88 | Temp 98.0°F | Ht 67.5 in | Wt 177.8 lb

## 2017-12-24 DIAGNOSIS — J329 Chronic sinusitis, unspecified: Secondary | ICD-10-CM | POA: Diagnosis not present

## 2017-12-24 DIAGNOSIS — J310 Chronic rhinitis: Secondary | ICD-10-CM

## 2017-12-24 MED ORDER — AMOXICILLIN-POT CLAVULANATE 875-125 MG PO TABS: ORAL_TABLET | ORAL | 0 refills | Status: DC

## 2017-12-24 NOTE — Progress Notes (Signed)
   Subjective:    Patient ID: Joshua Alvarez, male    DOB: 1945-04-29, 72 y.o.   MRN: [id number]  Cough  This is a new problem. The current episode started in the past 7 days. Cough characteristics: clear phlegm. Associated symptoms include ear pain, headaches, rhinorrhea and a sore throat.     Headache: Nature.  Sharp at times.  Worse with cough.  Radiating towards ears  Review of Systems  HENT: Positive for ear pain, rhinorrhea and sore throat.   Respiratory: Positive for cough.   Neurological: Positive for headaches.       Objective:   Physical Exam  Alert, mild malaise. Hydration good Vitals stable. frontal/ maxillary tenderness evident positive nasal congestion. pharynx normal neck supple  lungs clear/no crackles or wheezes. heart regular in rhythm       Assessment & Plan:  Impression rhinosinusitis likely post viral, discussed with patient. plan antibiotics prescribed. Questions answered. Symptomatic care discussed. warning signs discussed. WSL

## 2017-12-27 DIAGNOSIS — D2272 Melanocytic nevi of left lower limb, including hip: Secondary | ICD-10-CM | POA: Diagnosis not present

## 2017-12-27 DIAGNOSIS — D2372 Other benign neoplasm of skin of left lower limb, including hip: Secondary | ICD-10-CM | POA: Diagnosis not present

## 2017-12-27 DIAGNOSIS — L570 Actinic keratosis: Secondary | ICD-10-CM | POA: Diagnosis not present

## 2017-12-27 DIAGNOSIS — Z85828 Personal history of other malignant neoplasm of skin: Secondary | ICD-10-CM | POA: Diagnosis not present

## 2017-12-27 DIAGNOSIS — D2271 Melanocytic nevi of right lower limb, including hip: Secondary | ICD-10-CM | POA: Diagnosis not present

## 2017-12-27 DIAGNOSIS — L814 Other melanin hyperpigmentation: Secondary | ICD-10-CM | POA: Diagnosis not present

## 2017-12-27 DIAGNOSIS — D225 Melanocytic nevi of trunk: Secondary | ICD-10-CM | POA: Diagnosis not present

## 2017-12-27 DIAGNOSIS — L821 Other seborrheic keratosis: Secondary | ICD-10-CM | POA: Diagnosis not present

## 2018-01-07 ENCOUNTER — Telehealth: Payer: Self-pay | Admitting: Family Medicine

## 2018-01-07 MED ORDER — CEFDINIR 300 MG PO CAPS: 300.0000 mg | ORAL_CAPSULE | Freq: Two times a day (BID) | ORAL | 0 refills | Status: DC

## 2018-01-07 MED ORDER — BENZONATATE 100 MG PO CAPS: 100.0000 mg | ORAL_CAPSULE | Freq: Three times a day (TID) | ORAL | 0 refills | Status: DC | PRN

## 2018-01-07 NOTE — Telephone Encounter (Signed)
Prescriptions sent electronically to pharmacy. Patient notified. °

## 2018-01-07 NOTE — Telephone Encounter (Signed)
Patient was seen 12/24/17 with Dr.Steve for sinus/congested, patient has a cough and was wondering if he could get a medicine sent in for him, advise and inform pt of further.

## 2018-01-07 NOTE — Telephone Encounter (Signed)
omnicef 300 bid ten d  Tess perles 100 mg thirtyone tid prn cough

## 2018-01-07 NOTE — Telephone Encounter (Signed)
Patient states he was seen 12/24/17 and given Augmentin for sinus infection. Patient stated he is still having sinus symptoms and now has a cough and thinks he needs further medication. Please advise

## 2018-03-11 ENCOUNTER — Ambulatory Visit (INDEPENDENT_AMBULATORY_CARE_PROVIDER_SITE_OTHER): Payer: Medicare Other | Admitting: *Deleted

## 2018-03-11 DIAGNOSIS — Z23 Encounter for immunization: Secondary | ICD-10-CM

## 2018-04-12 ENCOUNTER — Other Ambulatory Visit: Payer: Self-pay | Admitting: Family Medicine

## 2018-04-17 ENCOUNTER — Ambulatory Visit (INDEPENDENT_AMBULATORY_CARE_PROVIDER_SITE_OTHER): Payer: Medicare Other | Admitting: Family Medicine

## 2018-04-17 ENCOUNTER — Encounter: Payer: Self-pay | Admitting: Family Medicine

## 2018-04-17 VITALS — BP 138/88 | Wt 175.2 lb

## 2018-04-17 DIAGNOSIS — E785 Hyperlipidemia, unspecified: Secondary | ICD-10-CM | POA: Diagnosis not present

## 2018-04-17 DIAGNOSIS — I10 Essential (primary) hypertension: Secondary | ICD-10-CM

## 2018-04-17 DIAGNOSIS — Z125 Encounter for screening for malignant neoplasm of prostate: Secondary | ICD-10-CM

## 2018-04-17 DIAGNOSIS — Z79899 Other long term (current) drug therapy: Secondary | ICD-10-CM | POA: Diagnosis not present

## 2018-04-17 MED ORDER — ATENOLOL 100 MG PO TABS: ORAL_TABLET | ORAL | 1 refills | Status: DC

## 2018-04-17 MED ORDER — HYDROCHLOROTHIAZIDE 25 MG PO TABS: 12.5000 mg | ORAL_TABLET | Freq: Every morning | ORAL | 1 refills | Status: DC

## 2018-04-17 MED ORDER — ENALAPRIL MALEATE 10 MG PO TABS: ORAL_TABLET | ORAL | 1 refills | Status: DC

## 2018-04-17 NOTE — Progress Notes (Signed)
   Subjective:    Patient ID: Joshua Alvarez, male    DOB: 04-28-1945, 73 y.o.   MRN: [id number]  Hyperlipidemia  This is a chronic problem. There are no compliance problems.   Hypertension  This is a chronic problem. There are no compliance problems.    Pt here today for 6 month follow up. Pt has been taking BP at home. Taking BP about every 2 days.  Top number ususally  130 or so   Bottom number around 80 overall does wel      Blood pressure medicine and blood pressure levels reviewed today with patient. Compliant with blood pressure medicine. States does not miss a dose. No obvious side effects. Blood pressure generally good when checked elsewhere. Watching salt intake.      Results for orders placed or performed in visit on 03/28/17  Lipid panel  Result Value Ref Range   Cholesterol, Total 217 (H) 100 - 199 mg/dL   Triglycerides 248 (H) 0 - 149 mg/dL   HDL 44 >39 mg/dL   VLDL Cholesterol Cal 50 (H) 5 - 40 mg/dL   LDL Calculated 123 (H) 0 - 99 mg/dL   Chol/HDL Ratio 4.9 0.0 - 5.0 ratio  Hepatic function panel  Result Value Ref Range   Total Protein 7.1 6.0 - 8.5 g/dL   Albumin 4.4 3.5 - 4.8 g/dL   Bilirubin Total 0.6 0.0 - 1.2 mg/dL   Bilirubin, Direct 0.15 0.00 - 0.40 mg/dL   Alkaline Phosphatase 97 39 - 117 IU/L   AST 19 0 - 40 IU/L   ALT 11 0 - 44 IU/L  Basic metabolic panel  Result Value Ref Range   Glucose 89 65 - 99 mg/dL   BUN 13 8 - 27 mg/dL   Creatinine, Ser 1.12 0.76 - 1.27 mg/dL   GFR calc non Af Amer 66 >59 mL/min/1.73   GFR calc Af Amer 77 >59 mL/min/1.73   BUN/Creatinine Ratio 12 10 - 24   Sodium 141 134 - 144 mmol/L   Potassium 5.0 3.5 - 5.2 mmol/L   Chloride 102 96 - 106 mmol/L   CO2 26 20 - 29 mmol/L   Calcium 9.8 8.6 - 10.2 mg/dL  PSA  Result Value Ref Range   Prostate Specific Ag, Serum 4.3 (H) 0.0 - 4.0 ng/mL    Review of Systems No headache, no major weight loss or weight gain, no chest pain no back pain abdominal pain no change in  bowel habits complete ROS otherwise negative     Objective:   Physical Exam Alert vitals stable, NAD. Blood pressure good on repeat. HEENT normal. Lungs clear. Heart regular rate and rhythm.        Assessment & Plan:  Impression hypertension.  Good control.  Discussed to maintain same meds  2.  History of hyperlipidemia.  Patient working on his diet best he can.  Not the best over Christmas  Follow-up in 6 months for wellness plus chronic.  Fasting blood work discussed

## 2018-04-18 NOTE — Addendum Note (Signed)
Addended by: Carmelina Noun on: 04/18/2018 01:39 PM   Modules accepted: Orders

## 2018-04-22 DIAGNOSIS — E785 Hyperlipidemia, unspecified: Secondary | ICD-10-CM | POA: Diagnosis not present

## 2018-04-22 DIAGNOSIS — Z125 Encounter for screening for malignant neoplasm of prostate: Secondary | ICD-10-CM | POA: Diagnosis not present

## 2018-04-22 DIAGNOSIS — I10 Essential (primary) hypertension: Secondary | ICD-10-CM | POA: Diagnosis not present

## 2018-04-22 DIAGNOSIS — Z79899 Other long term (current) drug therapy: Secondary | ICD-10-CM | POA: Diagnosis not present

## 2018-04-23 LAB — HEPATIC FUNCTION PANEL
ALBUMIN: 4.3 g/dL (ref 3.5–4.8)
ALK PHOS: 107 IU/L (ref 39–117)
ALT: 16 IU/L (ref 0–44)
AST: 19 IU/L (ref 0–40)
BILIRUBIN TOTAL: 0.4 mg/dL (ref 0.0–1.2)
Bilirubin, Direct: 0.09 mg/dL (ref 0.00–0.40)
Total Protein: 6.8 g/dL (ref 6.0–8.5)

## 2018-04-23 LAB — PSA: Prostate Specific Ag, Serum: 4.1 ng/mL — ABNORMAL HIGH (ref 0.0–4.0)

## 2018-04-23 LAB — LIPID PANEL
CHOLESTEROL TOTAL: 227 mg/dL — AB (ref 100–199)
Chol/HDL Ratio: 4.7 ratio (ref 0.0–5.0)
HDL: 48 mg/dL (ref >39)
LDL Calculated: 151 mg/dL — ABNORMAL HIGH (ref 0–99)
Triglycerides: 142 mg/dL (ref 0–149)
VLDL CHOLESTEROL CAL: 28 mg/dL (ref 5–40)

## 2018-04-23 LAB — BASIC METABOLIC PANEL
BUN/Creatinine Ratio: 12 (ref 10–24)
BUN: 13 mg/dL (ref 8–27)
CO2: 25 mmol/L (ref 20–29)
CREATININE: 1.06 mg/dL (ref 0.76–1.27)
Calcium: 9.9 mg/dL (ref 8.6–10.2)
Chloride: 101 mmol/L (ref 96–106)
GFR, EST AFRICAN AMERICAN: 81 mL/min/{1.73_m2} (ref >59)
GFR, EST NON AFRICAN AMERICAN: 70 mL/min/{1.73_m2} (ref >59)
Glucose: 105 mg/dL — ABNORMAL HIGH (ref 65–99)
POTASSIUM: 5.4 mmol/L — AB (ref 3.5–5.2)
SODIUM: 138 mmol/L (ref 134–144)

## 2018-04-29 ENCOUNTER — Encounter: Payer: Self-pay | Admitting: Family Medicine

## 2018-06-27 DIAGNOSIS — L570 Actinic keratosis: Secondary | ICD-10-CM | POA: Diagnosis not present

## 2018-06-27 DIAGNOSIS — Z85828 Personal history of other malignant neoplasm of skin: Secondary | ICD-10-CM | POA: Diagnosis not present

## 2018-06-27 DIAGNOSIS — Z23 Encounter for immunization: Secondary | ICD-10-CM | POA: Diagnosis not present

## 2018-06-27 DIAGNOSIS — L821 Other seborrheic keratosis: Secondary | ICD-10-CM | POA: Diagnosis not present

## 2018-06-27 DIAGNOSIS — D2271 Melanocytic nevi of right lower limb, including hip: Secondary | ICD-10-CM | POA: Diagnosis not present

## 2018-06-27 DIAGNOSIS — D2272 Melanocytic nevi of left lower limb, including hip: Secondary | ICD-10-CM | POA: Diagnosis not present

## 2018-06-27 DIAGNOSIS — C4441 Basal cell carcinoma of skin of scalp and neck: Secondary | ICD-10-CM | POA: Diagnosis not present

## 2018-06-27 DIAGNOSIS — D485 Neoplasm of uncertain behavior of skin: Secondary | ICD-10-CM | POA: Diagnosis not present

## 2018-06-27 DIAGNOSIS — L814 Other melanin hyperpigmentation: Secondary | ICD-10-CM | POA: Diagnosis not present

## 2018-06-27 DIAGNOSIS — D225 Melanocytic nevi of trunk: Secondary | ICD-10-CM | POA: Diagnosis not present

## 2018-06-27 DIAGNOSIS — D2372 Other benign neoplasm of skin of left lower limb, including hip: Secondary | ICD-10-CM | POA: Diagnosis not present

## 2018-08-20 DIAGNOSIS — D3132 Benign neoplasm of left choroid: Secondary | ICD-10-CM | POA: Diagnosis not present

## 2018-10-21 ENCOUNTER — Other Ambulatory Visit: Payer: Self-pay

## 2018-10-22 ENCOUNTER — Other Ambulatory Visit: Payer: Self-pay

## 2018-10-22 ENCOUNTER — Encounter: Payer: Self-pay | Admitting: Family Medicine

## 2018-10-22 ENCOUNTER — Ambulatory Visit (INDEPENDENT_AMBULATORY_CARE_PROVIDER_SITE_OTHER): Payer: Medicare Other | Admitting: Family Medicine

## 2018-10-22 VITALS — BP 134/82 | Temp 97.7°F | Ht 67.5 in | Wt 174.0 lb

## 2018-10-22 DIAGNOSIS — Z0000 Encounter for general adult medical examination without abnormal findings: Secondary | ICD-10-CM

## 2018-10-22 DIAGNOSIS — I10 Essential (primary) hypertension: Secondary | ICD-10-CM

## 2018-10-22 NOTE — Progress Notes (Signed)
Subjective:    Patient ID: Joshua Alvarez, male    DOB: September 27, 1945, 73 y.o.   MRN: [id number]  Hypertension This is a chronic problem. Pertinent negatives include no chest pain, headaches or neck pain. There are no compliance problems.    The patient comes in today for a wellness visit.    A review of their health history was completed.  A review of medications was also completed.  Any needed refills; pt is needing refills on all chronic med  Eating habits: pt states he is eating a lot  Falls/  MVA accidents in past few months: none  Regular exercise: yard work   Blood pressure medicine and blood pressure levels reviewed today with patient. Compliant with blood pressure medicine. States does not miss a dose. No obvious side effects. Blood pressure generally good when checked elsewhere. Watching salt intake.  Andrena Mews   n due in two yrs    Eating varietu of food s Specialist pt sees on regular basis: urologist, dermatologist   Preventative health issues were discussed.   Additional concerns: none  Blood pressure medicine and blood pressure levels reviewed today with patient. Compliant with blood pressure medicine. States does not miss a dose. No obvious side effects. Blood pressure generally good when checked elsewhere. Watching salt intake.  Well hydrated.  No developed that pressure neuropathy and check your blood pressure some yourself Things metabolic exercise such work same things going cardiac  Review of Systems  Constitutional: Negative for activity change, appetite change and fever.  HENT: Negative for congestion and rhinorrhea.   Eyes: Negative for discharge.  Respiratory: Negative for cough and wheezing.   Cardiovascular: Negative for chest pain.  Gastrointestinal: Negative for abdominal pain, blood in stool and vomiting.  Genitourinary: Negative for difficulty urinating and frequency.  Musculoskeletal: Negative for neck pain.  Skin: Negative for rash.   Allergic/Immunologic: Negative for environmental allergies and food allergies.  Neurological: Negative for weakness and headaches.  Psychiatric/Behavioral: Negative for agitation.  All other systems reviewed and are negative.      Objective:   Physical Exam Constitutional:      Appearance: He is well-developed.  HENT:     Head: Normocephalic and atraumatic.     Right Ear: External ear normal.     Left Ear: External ear normal.     Nose: Nose normal.  Eyes:     Pupils: Pupils are equal, round, and reactive to light.  Neck:     Musculoskeletal: Normal range of motion and neck supple.     Thyroid: No thyromegaly.  Cardiovascular:     Rate and Rhythm: Normal rate and regular rhythm.     Heart sounds: Normal heart sounds. No murmur.  Pulmonary:     Effort: Pulmonary effort is normal. No respiratory distress.     Breath sounds: Normal breath sounds. No wheezing.  Abdominal:     General: Bowel sounds are normal. There is no distension.     Palpations: Abdomen is soft. There is no mass.     Tenderness: There is no abdominal tenderness.  Genitourinary:    Penis: Normal.   Musculoskeletal: Normal range of motion.  Lymphadenopathy:     Cervical: No cervical adenopathy.  Skin:    General: Skin is warm and dry.     Findings: No erythema.  Neurological:     Mental Status: He is alert.     Motor: No abnormal muscle tone.  Psychiatric:        Behavior:  Behavior normal.        Judgment: Judgment normal.           Assessment & Plan:  Impression wellness exam.  Diet discussed.  Exercise discussed.  Up-to-date on vaccinations.  General questions answered.  Coronavirus discussed.  Up-to-date on colonoscopy Due in 2 years  2.  Hypertension very good control discussed compliance discussed salt intake discussed maintain same meds follow-up in 6 months

## 2018-11-23 ENCOUNTER — Other Ambulatory Visit: Payer: Self-pay | Admitting: Family Medicine

## 2018-11-25 DIAGNOSIS — C4441 Basal cell carcinoma of skin of scalp and neck: Secondary | ICD-10-CM | POA: Diagnosis not present

## 2018-12-30 ENCOUNTER — Ambulatory Visit (INDEPENDENT_AMBULATORY_CARE_PROVIDER_SITE_OTHER): Payer: Medicare Other | Admitting: Otolaryngology

## 2018-12-30 DIAGNOSIS — H6123 Impacted cerumen, bilateral: Secondary | ICD-10-CM | POA: Diagnosis not present

## 2018-12-30 DIAGNOSIS — H903 Sensorineural hearing loss, bilateral: Secondary | ICD-10-CM | POA: Diagnosis not present

## 2019-01-02 DIAGNOSIS — D2372 Other benign neoplasm of skin of left lower limb, including hip: Secondary | ICD-10-CM | POA: Diagnosis not present

## 2019-01-02 DIAGNOSIS — D225 Melanocytic nevi of trunk: Secondary | ICD-10-CM | POA: Diagnosis not present

## 2019-01-02 DIAGNOSIS — Z23 Encounter for immunization: Secondary | ICD-10-CM | POA: Diagnosis not present

## 2019-01-02 DIAGNOSIS — C4441 Basal cell carcinoma of skin of scalp and neck: Secondary | ICD-10-CM | POA: Diagnosis not present

## 2019-01-02 DIAGNOSIS — L570 Actinic keratosis: Secondary | ICD-10-CM | POA: Diagnosis not present

## 2019-01-02 DIAGNOSIS — D2272 Melanocytic nevi of left lower limb, including hip: Secondary | ICD-10-CM | POA: Diagnosis not present

## 2019-01-02 DIAGNOSIS — Z85828 Personal history of other malignant neoplasm of skin: Secondary | ICD-10-CM | POA: Diagnosis not present

## 2019-01-02 DIAGNOSIS — D2271 Melanocytic nevi of right lower limb, including hip: Secondary | ICD-10-CM | POA: Diagnosis not present

## 2019-01-02 DIAGNOSIS — L814 Other melanin hyperpigmentation: Secondary | ICD-10-CM | POA: Diagnosis not present

## 2019-01-02 DIAGNOSIS — D485 Neoplasm of uncertain behavior of skin: Secondary | ICD-10-CM | POA: Diagnosis not present

## 2019-01-02 DIAGNOSIS — L821 Other seborrheic keratosis: Secondary | ICD-10-CM | POA: Diagnosis not present

## 2019-01-07 DIAGNOSIS — C4441 Basal cell carcinoma of skin of scalp and neck: Secondary | ICD-10-CM | POA: Diagnosis not present

## 2019-03-05 ENCOUNTER — Other Ambulatory Visit: Payer: Self-pay

## 2019-04-07 ENCOUNTER — Other Ambulatory Visit: Payer: Self-pay | Admitting: Family Medicine

## 2019-04-23 ENCOUNTER — Other Ambulatory Visit: Payer: Self-pay | Admitting: Family Medicine

## 2019-04-25 NOTE — Telephone Encounter (Signed)
Pharmacy calling checking on status pt is out of medication.

## 2019-05-15 ENCOUNTER — Encounter: Payer: Self-pay | Admitting: Family Medicine

## 2019-05-26 ENCOUNTER — Other Ambulatory Visit: Payer: Self-pay | Admitting: Family Medicine

## 2019-05-26 NOTE — Telephone Encounter (Signed)
Please schedule and then route back to nurses 

## 2019-05-26 NOTE — Telephone Encounter (Signed)
Ok times one rec six mo ck up next mo

## 2019-05-27 NOTE — Telephone Encounter (Signed)
Patient has appointment 3/16 for medication follow up

## 2019-05-27 NOTE — Telephone Encounter (Signed)
lvm to schedule visit  

## 2019-06-24 ENCOUNTER — Ambulatory Visit (INDEPENDENT_AMBULATORY_CARE_PROVIDER_SITE_OTHER): Payer: Medicare Other | Admitting: Family Medicine

## 2019-06-24 ENCOUNTER — Other Ambulatory Visit: Payer: Self-pay

## 2019-06-24 DIAGNOSIS — I10 Essential (primary) hypertension: Secondary | ICD-10-CM

## 2019-06-24 MED ORDER — ATENOLOL 100 MG PO TABS: ORAL_TABLET | ORAL | 1 refills | Status: DC

## 2019-06-24 MED ORDER — ENALAPRIL MALEATE 10 MG PO TABS: ORAL_TABLET | ORAL | 1 refills | Status: DC

## 2019-06-24 MED ORDER — HYDROCHLOROTHIAZIDE 25 MG PO TABS: 12.5000 mg | ORAL_TABLET | Freq: Every morning | ORAL | 1 refills | Status: DC

## 2019-06-24 NOTE — Progress Notes (Signed)
   Subjective:  Audio only  Patient ID: Joshua Alvarez, male    DOB: June 22, 1945, 74 y.o.   MRN: [id number]  Hypertension This is a chronic problem. The current episode started more than 1 year ago. Risk factors for coronary artery disease include male gender. Treatments tried: atenolol, vasotec, hctz. There are no compliance problems.    Patient states he has a hernia he needs checking but wants to wait until after covid gone.  Virtual Visit via Video Note  I connected with [name] on 06/24/19 at  1:10 PM EDT by a video enabled telemedicine application and verified that I am speaking with the correct person using two identifiers.  Location: Patient: home Provider: office   I discussed the limitations of evaluation and management by telemedicine and the availability of in person appointments. The patient expressed understanding and agreed to proceed.  History of Present Illness:    Observations/Objective:   Assessment and Plan:   Follow Up Instructions:    I discussed the assessment and treatment plan with the patient. The patient was provided an opportunity to ask questions and all were answered. The patient agreed with the plan and demonstrated an understanding of the instructions.   The patient was advised to call back or seek an in-person evaluation if the symptoms worsen or if the condition fails to improve as anticipated.  I provided  minutes of non-face-to-face time during this encounter.  128 over 84  Trying yto stay active   Compliant with the blood pressure medication.  No obvious side effects.  Has developed a hernia.  Noticeable swelling.  Reduces when he lies down.  No particular pain at this time.   Review of Systems No headache no chest pain no shortness of breath    Objective:   Physical Exam   Virtual     Assessment & Plan:  Impression 1 hypertension.  Apparent good control discussed to maintain same meds diet exercise discussed.  2.   Inguinal hernia.  Warning signs discussed.  6 months worth of medications written.  Follow-up in office then.  Diet exercise discussed

## 2019-07-07 ENCOUNTER — Other Ambulatory Visit: Payer: Self-pay | Admitting: Family Medicine

## 2019-07-22 DIAGNOSIS — D2372 Other benign neoplasm of skin of left lower limb, including hip: Secondary | ICD-10-CM | POA: Diagnosis not present

## 2019-07-22 DIAGNOSIS — Z85828 Personal history of other malignant neoplasm of skin: Secondary | ICD-10-CM | POA: Diagnosis not present

## 2019-07-22 DIAGNOSIS — D2272 Melanocytic nevi of left lower limb, including hip: Secondary | ICD-10-CM | POA: Diagnosis not present

## 2019-07-22 DIAGNOSIS — D485 Neoplasm of uncertain behavior of skin: Secondary | ICD-10-CM | POA: Diagnosis not present

## 2019-07-22 DIAGNOSIS — L821 Other seborrheic keratosis: Secondary | ICD-10-CM | POA: Diagnosis not present

## 2019-07-22 DIAGNOSIS — S70361A Insect bite (nonvenomous), right thigh, initial encounter: Secondary | ICD-10-CM | POA: Diagnosis not present

## 2019-07-22 DIAGNOSIS — D2271 Melanocytic nevi of right lower limb, including hip: Secondary | ICD-10-CM | POA: Diagnosis not present

## 2019-07-22 DIAGNOSIS — L814 Other melanin hyperpigmentation: Secondary | ICD-10-CM | POA: Diagnosis not present

## 2019-07-22 DIAGNOSIS — L578 Other skin changes due to chronic exposure to nonionizing radiation: Secondary | ICD-10-CM | POA: Diagnosis not present

## 2019-07-22 DIAGNOSIS — L570 Actinic keratosis: Secondary | ICD-10-CM | POA: Diagnosis not present

## 2019-07-22 DIAGNOSIS — Z23 Encounter for immunization: Secondary | ICD-10-CM | POA: Diagnosis not present

## 2019-07-22 DIAGNOSIS — D225 Melanocytic nevi of trunk: Secondary | ICD-10-CM | POA: Diagnosis not present

## 2019-10-24 DIAGNOSIS — S0501XD Injury of conjunctiva and corneal abrasion without foreign body, right eye, subsequent encounter: Secondary | ICD-10-CM | POA: Diagnosis not present

## 2019-10-29 ENCOUNTER — Telehealth: Payer: Self-pay | Admitting: Family Medicine

## 2019-10-29 NOTE — Telephone Encounter (Signed)
[facility] called - stated that patient called their office because Dr. Richardson Landry referred him there for his hernia.  (they asked that we fax referral & notes to [phone])   No referral in Epic, it is mentioned in his 06/24/2019 OV note that pt has a hernia but no mention of need for referral  Please advise, need to be seen or refer?

## 2019-10-29 NOTE — Telephone Encounter (Signed)
Pls see note below. Thx, dr. Lovena Le

## 2019-10-29 NOTE — Telephone Encounter (Signed)
Please contact pt to schedule appointment with Dr. Lovena Le before we can send referral for hernia.

## 2019-10-29 NOTE — Telephone Encounter (Signed)
Was a video visit that was done on last visit.   pt needs in person exam for referral thx, dr. Lovena Le

## 2019-10-30 NOTE — Telephone Encounter (Signed)
Called and discussed with pt. Pt verbalized understanding and transferred to the front to schedule visit.

## 2019-10-31 ENCOUNTER — Other Ambulatory Visit: Payer: Self-pay

## 2019-10-31 ENCOUNTER — Encounter: Payer: Self-pay | Admitting: Family Medicine

## 2019-10-31 ENCOUNTER — Ambulatory Visit (INDEPENDENT_AMBULATORY_CARE_PROVIDER_SITE_OTHER): Payer: Medicare Other | Admitting: Family Medicine

## 2019-10-31 VITALS — BP 130/88 | HR 66 | Temp 97.3°F | Ht 67.5 in | Wt 171.0 lb

## 2019-10-31 DIAGNOSIS — K4090 Unilateral inguinal hernia, without obstruction or gangrene, not specified as recurrent: Secondary | ICD-10-CM

## 2019-10-31 NOTE — Progress Notes (Signed)
Patient ID: Joshua Alvarez, male    DOB: 10-29-1945, 74 y.o.   MRN: [id number]   Chief Complaint  Patient presents with  . Hernia   Subjective:    HPI  CC- left inguinal hernia.   Has been there years, but would like it evaluated and to prevent it worsening.  Has had bilateral inguinal surgeries 20 yrs ago.  Pt not having pain and is on left side.  Not gotten any bigger, no fever, vomiting, diarrhea or bloody stools. No redness or tender to touch. Side in groin on left toward scrotum. Stays the same even with lifting.  pt would like a referral for inguinal hernia.    Medical History Joshua Alvarez has a past medical history of Allergic rhinitis, Colon polyp, Hypertension, and Migraine headache.   Outpatient Encounter Medications as of 10/31/2019  Medication Sig  . atenolol (TENORMIN) 100 MG tablet TAKE ONE-HALF TABLET (50MG ) BY MOUTH DAILY  . enalapril (VASOTEC) 10 MG tablet TAKE ONE (1) TABLET BY MOUTH EVERY DAY  . etodolac (LODINE) 400 MG tablet TAKE ONE TABLET BY MOUTH TWICE A DAY WITH FOOD  . hydrochlorothiazide (HYDRODIURIL) 25 MG tablet Take 0.5 tablets (12.5 mg total) by mouth every morning.   No facility-administered encounter medications on file as of 10/31/2019.     Review of Systems  Constitutional: Negative for chills and fever.  HENT: Negative for congestion, rhinorrhea and sore throat.   Respiratory: Negative for cough, shortness of breath and wheezing.   Cardiovascular: Negative for chest pain and leg swelling.  Gastrointestinal: Negative for abdominal pain, blood in stool, constipation, diarrhea, nausea and vomiting.       +left lower abd mass/hernia  Genitourinary: Negative for dysuria and frequency.  Skin: Negative for rash.  Neurological: Negative for dizziness, weakness and headaches.     Vitals BP (!) 130/88   Pulse 66   Temp (!) 97.3 F (36.3 C)   Ht 5' 7.5" (1.715 m)   Wt 171 lb (77.6 kg)   SpO2 99%   BMI 26.39 kg/m   Objective:    Physical Exam Vitals and nursing note reviewed.  Constitutional:      General: He is not in acute distress.    Appearance: Normal appearance.  Cardiovascular:     Rate and Rhythm: Normal rate and regular rhythm.     Pulses: Normal pulses.     Heart sounds: Normal heart sounds. No murmur heard.   Pulmonary:     Effort: Pulmonary effort is normal. No respiratory distress.     Breath sounds: Normal breath sounds.  Abdominal:     General: Bowel sounds are normal. There is no distension.     Palpations: Abdomen is soft.     Tenderness: There is no abdominal tenderness. There is no guarding or rebound.     Hernia: A hernia (left inguinal, about 8cm x 10 cm, no erythema, warmth, or redness) is present.  Musculoskeletal:        General: Normal range of motion.  Skin:    General: Skin is warm and dry.     Findings: No rash.  Neurological:     General: No focal deficit present.     Mental Status: He is alert and oriented to person, place, and time.  Psychiatric:        Mood and Affect: Mood normal.        Behavior: Behavior normal.      Assessment and Plan   1. Inguinal hernia of left  side without obstruction or gangrene - Ambulatory referral to General Surgery    [facility], Dr. Georgette Dover, or Dr. Ninfa Linden.  Call or rto if having worsening pain, fever, redness, vomiting, diarrhea.   Pt in agreement.   F/u prn.

## 2019-11-11 ENCOUNTER — Encounter: Payer: Self-pay | Admitting: Family Medicine

## 2019-12-01 ENCOUNTER — Ambulatory Visit: Payer: Medicare Other | Admitting: Family Medicine

## 2019-12-04 ENCOUNTER — Telehealth: Payer: Self-pay | Admitting: Family Medicine

## 2019-12-04 DIAGNOSIS — R3915 Urgency of urination: Secondary | ICD-10-CM | POA: Diagnosis not present

## 2019-12-04 DIAGNOSIS — E785 Hyperlipidemia, unspecified: Secondary | ICD-10-CM

## 2019-12-04 DIAGNOSIS — Z79899 Other long term (current) drug therapy: Secondary | ICD-10-CM

## 2019-12-04 DIAGNOSIS — N401 Enlarged prostate with lower urinary tract symptoms: Secondary | ICD-10-CM | POA: Diagnosis not present

## 2019-12-04 DIAGNOSIS — I10 Essential (primary) hypertension: Secondary | ICD-10-CM

## 2019-12-04 DIAGNOSIS — R972 Elevated prostate specific antigen [PSA]: Secondary | ICD-10-CM | POA: Diagnosis not present

## 2019-12-04 DIAGNOSIS — Z125 Encounter for screening for malignant neoplasm of prostate: Secondary | ICD-10-CM

## 2019-12-04 NOTE — Telephone Encounter (Signed)
Pt has scheduled a Phy with Santiago Glad 11[place]1 [place]. He is wanting to know if he can get his blood work done.   Pt call back [phone]

## 2019-12-16 ENCOUNTER — Other Ambulatory Visit: Payer: Self-pay | Admitting: Family Medicine

## 2020-01-08 ENCOUNTER — Other Ambulatory Visit: Payer: Self-pay | Admitting: *Deleted

## 2020-01-08 MED ORDER — ENALAPRIL MALEATE 10 MG PO TABS: ORAL_TABLET | ORAL | 0 refills | Status: DC

## 2020-02-09 ENCOUNTER — Encounter: Payer: Medicare Other | Admitting: Family Medicine

## 2020-02-10 ENCOUNTER — Ambulatory Visit (INDEPENDENT_AMBULATORY_CARE_PROVIDER_SITE_OTHER): Payer: Medicare Other | Admitting: Family Medicine

## 2020-02-10 ENCOUNTER — Encounter: Payer: Self-pay | Admitting: Family Medicine

## 2020-02-10 VITALS — BP 132/88 | HR 62 | Temp 97.9°F | Ht 67.0 in | Wt 169.0 lb

## 2020-02-10 DIAGNOSIS — Z0000 Encounter for general adult medical examination without abnormal findings: Secondary | ICD-10-CM

## 2020-02-10 DIAGNOSIS — E785 Hyperlipidemia, unspecified: Secondary | ICD-10-CM | POA: Diagnosis not present

## 2020-02-10 DIAGNOSIS — I10 Essential (primary) hypertension: Secondary | ICD-10-CM | POA: Diagnosis not present

## 2020-02-10 DIAGNOSIS — N400 Enlarged prostate without lower urinary tract symptoms: Secondary | ICD-10-CM | POA: Diagnosis not present

## 2020-02-10 DIAGNOSIS — Z23 Encounter for immunization: Secondary | ICD-10-CM | POA: Diagnosis not present

## 2020-02-10 DIAGNOSIS — Z79899 Other long term (current) drug therapy: Secondary | ICD-10-CM | POA: Diagnosis not present

## 2020-02-10 DIAGNOSIS — Z125 Encounter for screening for malignant neoplasm of prostate: Secondary | ICD-10-CM | POA: Diagnosis not present

## 2020-02-10 MED ORDER — HYDROCHLOROTHIAZIDE 25 MG PO TABS: 12.5000 mg | ORAL_TABLET | Freq: Every morning | ORAL | 1 refills | Status: DC

## 2020-02-10 MED ORDER — ATENOLOL 100 MG PO TABS: ORAL_TABLET | ORAL | 1 refills | Status: DC

## 2020-02-10 MED ORDER — ENALAPRIL MALEATE 10 MG PO TABS: ORAL_TABLET | ORAL | 1 refills | Status: DC

## 2020-02-10 MED ORDER — TAMSULOSIN HCL 0.4 MG PO CAPS: 0.4000 mg | ORAL_CAPSULE | Freq: Every day | ORAL | 3 refills | Status: DC

## 2020-02-10 NOTE — Progress Notes (Signed)
Patient ID: Joshua Alvarez, male    DOB: [birth year], 74 y.o.   MRN: [id number]   Chief Complaint  Patient presents with  . Annual Exam   Subjective:  CC: wellness visit  Joshua Alvarez presents today for his annual wellness visit.  His chronic conditions include hypertension, and prostate hypertrophy.  No compliance issues identified.  He reports that his urine stream is weak, and would like to restart Flomax.  He sees the general surgeon next week for hernia repair.  AWV- Annual Wellness Visit  The patient was seen for their annual wellness visit. The patient's past medical history, surgical history, and family history were reviewed. Pertinent vaccines were reviewed ( tetanus, pneumonia, shingles, flu) The patient's medication list was reviewed and updated.  The height and weight were entered.  BMI recorded in electronic record elsewhere  Cognitive screening was completed. Outcome of Mini - Cog: pass   Falls /depression screening electronically recorded within record elsewhere  Current tobacco usage: none (All patients who use tobacco were given written and verbal information on quitting)  Recent listing of emergency department/hospitalizations over the past year were reviewed.  current specialist the patient sees on a regular basis: [facility] Dr. Charolette Child, Dr. Could dermatologist.    Medicare annual wellness visit patient questionnaire was reviewed.  A written screening schedule for the patient for the next 5-10 years was given. Appropriate discussion of followup regarding next visit was discussed.  Would like senior flu vaccine.   Use to be on flowmax and stopped bc he thought it was causing shoulder pain. Would like to go back on flowmax because his shoulder have still been hurting since stopping med.       Medical History [name] has a past medical history of Allergic rhinitis, Colon polyp, Hypertension, and Migraine headache.   Outpatient Encounter  Medications as of 02/10/2020  Medication Sig  . atenolol (TENORMIN) 100 MG tablet Take one half tablet by mouth daily  . enalapril (VASOTEC) 10 MG tablet TAKE ONE (1) TABLET BY MOUTH EVERY DAY  . etodolac (LODINE) 400 MG tablet TAKE ONE TABLET BY MOUTH TWICE A DAY WITH FOOD  . hydrochlorothiazide (HYDRODIURIL) 25 MG tablet Take 0.5 tablets (12.5 mg total) by mouth every morning.  . [DISCONTINUED] atenolol (TENORMIN) 100 MG tablet TAKE ONE-HALF TABLET (50MG ) BY MOUTH DAILY  . [DISCONTINUED] enalapril (VASOTEC) 10 MG tablet TAKE ONE (1) TABLET BY MOUTH EVERY DAY  . [DISCONTINUED] hydrochlorothiazide (HYDRODIURIL) 25 MG tablet Take 0.5 tablets (12.5 mg total) by mouth every morning.  . tamsulosin (FLOMAX) 0.4 MG CAPS capsule Take 1 capsule (0.4 mg total) by mouth daily.   No facility-administered encounter medications on file as of 02/10/2020.     Review of Systems  Constitutional: Negative for fatigue and fever.  Respiratory: Negative for shortness of breath.   Cardiovascular: Negative for chest pain.  Gastrointestinal: Negative for abdominal pain.  Genitourinary:       Weak urine stream  Neurological: Negative for headaches.  Hematological: Negative for adenopathy.     Vitals BP 132/88   Pulse 62   Temp 97.9 F (36.6 C)   Ht 5\' 7"  (1.702 m)   Wt 169 lb (76.7 kg)   SpO2 100%   BMI 26.47 kg/m   Objective:   Physical Exam Constitutional:      General: He is not in acute distress.    Appearance: Normal appearance.  HENT:     Head: Normocephalic.     Right Ear:  Tympanic membrane normal.     Left Ear: Tympanic membrane normal.     Nose: Nose normal.     Mouth/Throat:     Mouth: Mucous membranes are moist.     Pharynx: Oropharynx is clear.  Eyes:     Extraocular Movements: Extraocular movements intact.  Cardiovascular:     Rate and Rhythm: Normal rate and regular rhythm.     Heart sounds: Normal heart sounds.  Pulmonary:     Effort: Pulmonary effort is normal.      Breath sounds: Normal breath sounds.  Abdominal:     General: Bowel sounds are normal.     Palpations: Abdomen is soft.     Tenderness: There is no abdominal tenderness.  Genitourinary:    Comments: DRE deferred (recently examined by urology) Musculoskeletal:     Comments: Decreased ROM both shoulders. Cannot extend arms over head. (not new problem).  Skin:    General: Skin is warm and dry.  Neurological:     Mental Status: He is alert and oriented to person, place, and time. Mental status is at baseline.  Psychiatric:        Mood and Affect: Mood normal.        Behavior: Behavior normal.        Thought Content: Thought content normal.        Judgment: Judgment normal.      Assessment and Plan   1. Routine general medical examination at a health care facility  2. Need for vaccination - Flu Vaccine QUAD High Dose(Fluad)  3. Prostate hypertrophy - tamsulosin (FLOMAX) 0.4 MG CAPS capsule; Take 1 capsule (0.4 mg total) by mouth daily.  Dispense: 30 capsule; Refill: 3  4. Essential hypertension, benign - atenolol (TENORMIN) 100 MG tablet; Take one half tablet by mouth daily  Dispense: 45 tablet; Refill: 1 - hydrochlorothiazide (HYDRODIURIL) 25 MG tablet; Take 0.5 tablets (12.5 mg total) by mouth every morning.  Dispense: 45 tablet; Refill: 1 - enalapril (VASOTEC) 10 MG tablet; TAKE ONE (1) TABLET BY MOUTH EVERY DAY  Dispense: 90 tablet; Refill: 1   Essential hypertension: Risk factors for CAD include male gender, hypertension.  Is compliant with his blood pressure medication, and checks his blood pressure occasionally.  He denies headache, chest pain, shortness of breath.  He does watch his sodium and wishes to start back on a walking routine.  He will get his labs drawn that were ordered for him on August 26 by Dr. Lovena Le.  Will refill his medications for hypertension. Rechecked BP: 120/86.  Prostate hypertrophy: Sees Dr. Louis Meckel, urologist.  Was last seen very recently and DRE  performed.  Did not request a refill on Flomax at that time, will restart today.  In the past he thought Flomax was responsible for dizziness and shoulder pain, but now believes that that it is not responsible for that and wishes to restart.  He reports a weak stream, and gets up 1 time during the night to urinate.  Specialist include: General surgery for hernia repair, consult next week. Dr. Eds[facility], for prostate hypertrophy. Dr. Louis Meckel. ENT for hearing aids, ear cleaning. Dr. Lutricia Feil He has decreased range of motion in both shoulders, wishes to get through the hernia surgery before he goes to an orthopedic specialist.  He will receive his influenza vaccine today.  All questions and concerns addressed, he will continue with a low-sodium diet and he is encouraged to start his walking routine prior to his upcoming surgery, and to continue  walking as a part of his recovery.  Agrees with plan of care discussed today. Understands warning signs to seek further care: Chest pain, shortness of breath, any significant changes in health status. Understands to follow-up in 6 months, for blood pressure medication management, sooner if anything changes.  Will notify him of lab results once they become available.  Pecolia Ades, FNP-C 02/10/2020

## 2020-02-10 NOTE — Patient Instructions (Addendum)
Monitor your blood pressure at least once per month. Start walking before your upcoming surgery. Continue walking after surgery.     Preventive Care 39 Years and Older, Male Preventive care refers to lifestyle choices and visits with your health care provider that can promote health and wellness. This includes:  A yearly physical exam. This is also called an annual well check.  Regular dental and eye exams.  Immunizations.  Screening for certain conditions.  Healthy lifestyle choices, such             as diet and exercise. What can I expect for my preventive care visit? Physical exam Your health care provider will check:  Height and weight. These may be used to calculate body mass index (BMI), which is a measurement that tells if you are at a healthy weight.  Heart rate and blood pressure.  Your skin for abnormal spots. Counseling Your health care provider may ask you questions about:  Alcohol, tobacco, and drug use.  Emotional well-being.  Home and relationship well-being.  Sexual activity.  Eating habits.  History of falls.  Memory and ability to understand (cognition).  Work and work Statistician. What immunizations do I need?  Influenza (flu) vaccine  This is recommended every year. Tetanus, diphtheria, and pertussis (Tdap) vaccine  You may need a Td booster every 10 years. Varicella (chickenpox) vaccine  You may need this vaccine if you have not already been vaccinated. Zoster (shingles) vaccine  You may need this after age 78. Pneumococcal conjugate (PCV13) vaccine  One dose is recommended after age 94. Pneumococcal polysaccharide (PPSV23) vaccine  One dose is recommended after age 29. Measles, mumps, and rubella (MMR) vaccine  You may need at least one dose of MMR if you were born in 1957 or later. You may also need a second dose. Meningococcal conjugate (MenACWY) vaccine  You may need this if you have certain conditions. Hepatitis A  vaccine  You may need this if you have certain conditions or if you travel or work in places where you may be exposed to hepatitis A. Hepatitis B vaccine  You may need this if you have certain conditions or if you travel or work in places where you may be exposed to hepatitis B. Haemophilus influenzae type b (Hib) vaccine  You may need this if you have certain conditions. You may receive vaccines as individual doses or as more than one vaccine together in one shot (combination vaccines). Talk with your health care provider about the risks and benefits of combination vaccines. What tests do I need? Blood tests  Lipid and cholesterol levels. These may be checked every 5 years, or more frequently depending on your overall health.  Hepatitis C test.  Hepatitis B test. Screening  Lung cancer screening. You may have this screening every year starting at age 25 if you have a 30-pack-year history of smoking and currently smoke or have quit within the past 15 years.  Colorectal cancer screening. All adults should have this screening starting at age 38 and continuing until age 1. Your health care provider may recommend screening at age 37 if you are at increased risk. You will have tests every 1-10 years, depending on your results and the type of screening test.  Prostate cancer screening. Recommendations will vary depending on your family history and other risks.  Diabetes screening. This is done by checking your blood sugar (glucose) after you have not eaten for a while (fasting). You may have this done every 1-3  years.  Abdominal aortic aneurysm (AAA) screening. You may need this if you are a current or former smoker.  Sexually transmitted disease (STD) testing. Follow these instructions at home: Eating and drinking  Eat a diet that includes fresh fruits and vegetables, whole grains, lean protein, and low-fat dairy products. Limit your intake of foods with high amounts of sugar, saturated  fats, and salt.  Take vitamin and mineral supplements as recommended by your health care provider.  Do not drink alcohol if your health care provider tells you not to drink.  If you drink alcohol: ? Limit how much you have to 0-2 drinks a day. ? Be aware of how much alcohol is in your drink. In the [place] one drink equals one 12 oz bottle of beer (355 mL), one 5 oz glass of wine (148 mL), or one 1 oz glass of hard liquor (44 mL). Lifestyle  Take daily care of your teeth and gums.  Stay active. Exercise for at least 30 minutes on 5 or more days each week.  Do not use any products that contain nicotine or tobacco, such as cigarettes, e-cigarettes, and chewing tobacco. If you need help quitting, ask your health care provider.  If you are sexually active, practice safe sex. Use a condom or other form of protection to prevent STIs (sexually transmitted infections).  Talk with your health care provider about taking a low-dose aspirin or statin. What's next?  Visit your health care provider once a year for a well check visit.  Ask your health care provider how often you should have your eyes and teeth checked.  Stay up to date on all vaccines. This information is not intended to replace advice given to you by your health care provider. Make sure you discuss any questions you have with your health care provider. Document Revised: 03/21/2018 Document Reviewed: 03/21/2018 Elsevier Patient Education  2020 [employer].

## 2020-02-11 DIAGNOSIS — H6123 Impacted cerumen, bilateral: Secondary | ICD-10-CM | POA: Diagnosis not present

## 2020-02-11 LAB — LIPID PANEL
Chol/HDL Ratio: 4.9 ratio (ref 0.0–5.0)
Cholesterol, Total: 268 mg/dL — ABNORMAL HIGH (ref 100–199)
HDL: 55 mg/dL (ref >39)
LDL Chol Calc (NIH): 189 mg/dL — ABNORMAL HIGH (ref 0–99)
Triglycerides: 133 mg/dL (ref 0–149)
VLDL Cholesterol Cal: 24 mg/dL (ref 5–40)

## 2020-02-11 LAB — HEPATIC FUNCTION PANEL
ALT: 15 IU/L (ref 0–44)
AST: 24 IU/L (ref 0–40)
Albumin: 4.8 g/dL — ABNORMAL HIGH (ref 3.7–4.7)
Alkaline Phosphatase: 119 IU/L (ref 44–121)
Bilirubin Total: 0.8 mg/dL (ref 0.0–1.2)
Bilirubin, Direct: 0.19 mg/dL (ref 0.00–0.40)
Total Protein: 7.3 g/dL (ref 6.0–8.5)

## 2020-02-11 LAB — BASIC METABOLIC PANEL
BUN/Creatinine Ratio: 13 (ref 10–24)
BUN: 13 mg/dL (ref 8–27)
CO2: 27 mmol/L (ref 20–29)
Calcium: 10.4 mg/dL — ABNORMAL HIGH (ref 8.6–10.2)
Chloride: 100 mmol/L (ref 96–106)
Creatinine, Ser: 1.03 mg/dL (ref 0.76–1.27)
GFR calc Af Amer: 83 mL/min/{1.73_m2} (ref >59)
GFR calc non Af Amer: 72 mL/min/{1.73_m2} (ref >59)
Glucose: 97 mg/dL (ref 65–99)
Potassium: 5.5 mmol/L — ABNORMAL HIGH (ref 3.5–5.2)
Sodium: 139 mmol/L (ref 134–144)

## 2020-02-11 LAB — PSA: Prostate Specific Ag, Serum: 5.7 ng/mL — ABNORMAL HIGH (ref 0.0–4.0)

## 2020-02-12 DIAGNOSIS — L814 Other melanin hyperpigmentation: Secondary | ICD-10-CM | POA: Diagnosis not present

## 2020-02-12 DIAGNOSIS — D485 Neoplasm of uncertain behavior of skin: Secondary | ICD-10-CM | POA: Diagnosis not present

## 2020-02-12 DIAGNOSIS — D2271 Melanocytic nevi of right lower limb, including hip: Secondary | ICD-10-CM | POA: Diagnosis not present

## 2020-02-12 DIAGNOSIS — L570 Actinic keratosis: Secondary | ICD-10-CM | POA: Diagnosis not present

## 2020-02-12 DIAGNOSIS — L578 Other skin changes due to chronic exposure to nonionizing radiation: Secondary | ICD-10-CM | POA: Diagnosis not present

## 2020-02-12 DIAGNOSIS — D225 Melanocytic nevi of trunk: Secondary | ICD-10-CM | POA: Diagnosis not present

## 2020-02-12 DIAGNOSIS — D2272 Melanocytic nevi of left lower limb, including hip: Secondary | ICD-10-CM | POA: Diagnosis not present

## 2020-02-12 DIAGNOSIS — D2372 Other benign neoplasm of skin of left lower limb, including hip: Secondary | ICD-10-CM | POA: Diagnosis not present

## 2020-02-12 DIAGNOSIS — Z85828 Personal history of other malignant neoplasm of skin: Secondary | ICD-10-CM | POA: Diagnosis not present

## 2020-02-12 DIAGNOSIS — L821 Other seborrheic keratosis: Secondary | ICD-10-CM | POA: Diagnosis not present

## 2020-02-17 ENCOUNTER — Ambulatory Visit: Payer: Self-pay | Admitting: General Surgery

## 2020-02-17 DIAGNOSIS — K4090 Unilateral inguinal hernia, without obstruction or gangrene, not specified as recurrent: Secondary | ICD-10-CM | POA: Diagnosis not present

## 2020-02-17 NOTE — H&P (Signed)
History of Present Illness Ralene Ok MD; 02/17/2020 8:53 AM) The patient is a 74 year old male who presents with an inguinal hernia. Referred by: Dr. Louis Meckel Chief Complaint: Left inguinal hernia  Patient is a 74 year old male, with history of hypertension, comes in with a one-year history of lifting more hernia. He states is been getting larger. He states that he is active, has had no significant pain to the area is worried since it is getting larger.  Patient has had previous bilateral hernia repairs approximately 20 years ago. He's had bladder surgery in the past.  Patient had no signs or symptoms of incarceration or strangulation.    Past Surgical History Antonietta Jewel, [place]; 02/17/2020 8:40 AM) Vasectomy   Diagnostic Studies History (Chanel Teressa Senter, CMA; 02/17/2020 8:40 AM) Colonoscopy  5-10 years ago  Allergies Emmaline Kluver Teressa Senter, CMA; 02/17/2020 8:41 AM) No Known Drug Allergies  [02/17/2020]: Allergies Reconciled   Medication History (Chanel Teressa Senter, CMA; 02/17/2020 8:41 AM) Atenolol (100MG  Tablet, Oral) Active. Enalapril Maleate (10MG  Tablet, Oral) Active. Etodolac (400MG  Tablet, Oral) Active. hydroCHLOROthiazide (25MG  Tablet, Oral) Active. Tamsulosin HCl (0.4MG  Capsule, Oral) Active. Medications Reconciled  Social History Antonietta Jewel, CMA; 02/17/2020 8:40 AM) Tobacco use  Former smoker.  Other Problems (Chanel Teressa Senter, CMA; 02/17/2020 8:40 AM) Arthritis  Hemorrhoids  High blood pressure  Kidney Stone     Review of Systems Ralene Ok MD; 02/17/2020 8:52 AM) General Not Present- Appetite Loss, Chills, Fatigue, Fever, Night Sweats, Weight Gain and Weight Loss. HEENT Present- Hearing Loss. Not Present- Earache, Hoarseness, Nose Bleed, Oral Ulcers, Ringing in the Ears, Seasonal Allergies, Sinus Pain, Sore Throat, Visual Disturbances, Wears glasses/contact lenses and Yellow Eyes. Cardiovascular Not Present- Chest Pain, Difficulty Breathing Lying Down, Leg  Cramps, Palpitations, Rapid Heart Rate, Shortness of Breath and Swelling of Extremities. Gastrointestinal Not Present- Abdominal Pain, Bloating, Bloody Stool, Change in Bowel Habits, Chronic diarrhea, Constipation, Difficulty Swallowing, Excessive gas, Gets full quickly at meals, Hemorrhoids, Indigestion, Nausea, Rectal Pain and Vomiting. Male Genitourinary Present- Urine Leakage. Not Present- Blood in Urine, Change in Urinary Stream, Frequency, Impotence, Nocturia, Painful Urination and Urgency. Musculoskeletal Present- Joint Pain. Not Present- Back Pain, Joint Stiffness, Muscle Pain, Muscle Weakness and Swelling of Extremities. Neurological Not Present- Decreased Memory, Fainting, Headaches, Numbness, Seizures, Tingling, Tremor, Trouble walking and Weakness. Psychiatric Not Present- Anxiety, Bipolar, Change in Sleep Pattern, Depression, Fearful and Frequent crying. Endocrine Not Present- Cold Intolerance, Excessive Hunger, Hair Changes, Heat Intolerance, Hot flashes and New Diabetes. Hematology Not Present- Blood Thinners, Easy Bruising, Excessive bleeding, Gland problems, HIV and Persistent Infections. All other systems negative  Vitals (Chanel Nolan CMA; 02/17/2020 8:41 AM) 02/17/2020 8:41 AM Weight: 172.38 lb Height: 67in Body Surface Area: 1.9 m Body Mass Index: 27 kg/m  Temp.: 97.7F  Pulse: 65 (Regular)  BP: 122/74(Sitting, Left Arm, Standard)       Physical Exam Ralene Ok MD; 02/17/2020 8:53 AM) The physical exam findings are as follows: Note: Constitutional: No acute distress, conversant, appears stated age  Eyes: Anicteric sclerae, moist conjunctiva, no lid lag  Neck: No thyromegaly, trachea midline, no cervical lymphadenopathy  Lungs: Clear to auscultation biilaterally, normal respiratory effot  Cardiovascular: regular rate & rhythm, no murmurs, no peripheal edema, pedal pulses 2+  GI: Soft, no masses or hepatosplenomegaly, non-tender to  palpation  MSK: Normal gait, no clubbing cyanosis, edema  Skin: No rashes, palpation reveals normal skin turgor  Psychiatric: Appropriate judgment and insight, oriented to person, place, and time  Abdomen Inspection Hernias - Left - Inguinal  hernia - Reducible - Left.    Assessment & Plan Ralene Ok MD; 02/17/2020 8:54 AM) LEFT INGUINAL HERNIA (K40.90) Impression: 74 year old male with a history of hypertension, left inguinal hernia  1. The patient will like to proceed to the operating room for robotic left inguinal hernia repair with mesh.  2. I discussed with the patient the signs and symptoms of incarceration and strangulation and the need to proceed to the ER should they occur.  3. I discussed with the patient the risks and benefits of the procedure to include but not limited to: Infection, bleeding, damage to surrounding structures, possible need for further surgery, possible nerve pain, and possible recurrence. The patient was understanding and wishes to proceed.

## 2020-02-23 ENCOUNTER — Other Ambulatory Visit: Payer: Self-pay

## 2020-02-23 ENCOUNTER — Telehealth: Payer: Self-pay | Admitting: Family Medicine

## 2020-02-23 ENCOUNTER — Telehealth (INDEPENDENT_AMBULATORY_CARE_PROVIDER_SITE_OTHER): Payer: Medicare Other | Admitting: Family Medicine

## 2020-02-23 ENCOUNTER — Encounter: Payer: Self-pay | Admitting: Family Medicine

## 2020-02-23 DIAGNOSIS — J3089 Other allergic rhinitis: Secondary | ICD-10-CM

## 2020-02-23 MED ORDER — FLUTICASONE PROPIONATE 50 MCG/ACT NA SUSP: 2.0000 | Freq: Every day | NASAL | 1 refills | Status: DC

## 2020-02-23 MED ORDER — PREDNISONE 10 MG PO TABS: ORAL_TABLET | ORAL | 0 refills | Status: DC

## 2020-02-23 NOTE — Progress Notes (Signed)
Patient ID: Joshua Alvarez, male    DOB: Sep 10, 1945, 74 y.o.   MRN: [id number]  Virtual Visit via Telephone Note  I connected with [name] on 02/23/20 at  4:10 PM EST by telephone and verified that I am speaking with the correct person using two identifiers.  Location: Patient: home Provider: office   I discussed the limitations, risks, security and privacy concerns of performing an evaluation and management service by telephone and the availability of in person appointments. I also discussed with the patient that there may be a patient responsible charge related to this service. The patient expressed understanding and agreed to proceed.    Chief Complaint  Patient presents with  . Sinusitis   Subjective:    HPI   Pt having sinus issues, runny nose and been blowing nose a lot. Symptoms began 2 days ago.  Pt stating blowing nose a lot and keeping up at night. No fever.  Mucinex, zyrtec, coricidin hbp. Not using nose spray. No sick contacts. Had covid vaccines. No ear or throat pain. No n/v/d. No coughing. Clear drainage. Pain in face and pressure, slightly.   Medical History Alyjah has a past medical history of Allergic rhinitis, Colon polyp, Hypertension, and Migraine headache.   Outpatient Encounter Medications as of 02/23/2020  Medication Sig  . atenolol (TENORMIN) 100 MG tablet Take one half tablet by mouth daily  . enalapril (VASOTEC) 10 MG tablet TAKE ONE (1) TABLET BY MOUTH EVERY DAY  . etodolac (LODINE) 400 MG tablet TAKE ONE TABLET BY MOUTH TWICE A DAY WITH FOOD  . hydrochlorothiazide (HYDRODIURIL) 25 MG tablet Take 0.5 tablets (12.5 mg total) by mouth every morning.  . tamsulosin (FLOMAX) 0.4 MG CAPS capsule Take 1 capsule (0.4 mg total) by mouth daily.  . fluticasone (FLONASE) 50 MCG/ACT nasal spray Place 2 sprays into both nostrils daily.  . predniSONE (DELTASONE) 10 MG tablet Take 3 tab p.o. x 2 days, then 2 tab x 2 days, then 1 tab x 2 days.   Take with food.   No facility-administered encounter medications on file as of 02/23/2020.     Review of Systems  Constitutional: Negative for chills and fever.  HENT: Positive for congestion, rhinorrhea and sinus pressure. Negative for ear pain, postnasal drip, sinus pain, sneezing and sore throat.   Eyes: Negative for pain, discharge and itching.  Respiratory: Negative for cough, shortness of breath and wheezing.   Gastrointestinal: Negative for diarrhea, nausea and vomiting.  Skin: Negative for rash.  Neurological: Negative for headaches.     Vitals There were no vitals taken for this visit.  Objective:   Physical Exam ' No PE due to phone visit.  Assessment and Plan   1. Allergic rhinitis due to other allergic trigger, unspecified seasonality - predniSONE (DELTASONE) 10 MG tablet; Take 3 tab p.o. x 2 days, then 2 tab x 2 days, then 1 tab x 2 days.  Take with food.  Dispense: 12 tablet; Refill: 0 - fluticasone (FLONASE) 50 MCG/ACT nasal spray; Place 2 sprays into both nostrils daily.  Dispense: 16 g; Refill: 1   Advising symptomatic tx for viral illness vs. Allergic rhinitis. Reviewed usual course of viral illness vs. Sinusitis and antibiotic use.advising likely viral illness and usual course of 7-10 days.  increase fluids and call if not improving in next 2-3 days.   Pt advised to get covid testing.  Advised to call back with results if positive for guidance. mucinex or coricidin hbp -  bid with increase in fluids.  Flonase- for congestion. Tylenol/ibuprofen prn for bodyaches/fever. Gave [place] course of prednisone to help with the excessive runny nose and pressure.  Pt in agreement with plan. F/u prn.  Follow Up Instructions:    I discussed the assessment and treatment plan with the patient. The patient was provided an opportunity to ask questions and all were answered. The patient agreed with the plan and demonstrated an understanding of the instructions.   The  patient was advised to call back or seek an in-person evaluation if the symptoms worsen or if the condition fails to improve as anticipated.  I provided 12 minutes of non-face-to-face time during this encounter.

## 2020-02-23 NOTE — Telephone Encounter (Signed)
Mr. venus, ruhe are scheduled for a virtual visit with your provider today.    Just as we do with appointments in the office, we must obtain your consent to participate.  Your consent will be active for this visit and any virtual visit you may have with one of our providers in the next 365 days.    If you have a MyChart account, I can also send a copy of this consent to you electronically.  All virtual visits are billed to your insurance company just like a traditional visit in the office.  As this is a virtual visit, video technology does not allow for your provider to perform a traditional examination.  This may limit your provider's ability to fully assess your condition.  If your provider identifies any concerns that need to be evaluated in person or the need to arrange testing such as labs, EKG, etc, we will make arrangements to do so.    Although advances in technology are sophisticated, we cannot ensure that it will always work on either your end or our end.  If the connection with a video visit is poor, we may have to switch to a telephone visit.  With either a video or telephone visit, we are not always able to ensure that we have a secure connection.   I need to obtain your verbal consent now.   Are you willing to proceed with your visit today?   JEMERY STACEY has provided verbal consent on 02/23/2020 for a virtual visit (video or telephone).   Vicente Males, LPN [id number]  3:81 PM

## 2020-04-29 NOTE — Pre-Procedure Instructions (Signed)
[place], [place] [address] Phone: [phone] Fax: [phone]     Your procedure is scheduled on Tuesday January 25th.  Report to [facility] Main Entrance "A" at 8:30 A.M., and check in at the Admitting office.  Call this number if you have problems the morning of surgery:  [phone]  Call [phone] if you have any questions prior to your surgery date Monday-Friday 8am-4pm    Remember:  Do not eat after midnight the night before your surgery  You may drink clear liquids until 7:30 the morning of your surgery.   Clear liquids allowed are: Water, Non-Citrus Juices (without pulp), Carbonated Beverages, Clear Tea, Black Coffee Only, and Gatorade  Patient Instructions  . The night before surgery:  o No food after midnight. ONLY clear liquids after midnight  . The day of surgery (if you do NOT have diabetes):  o Drink ONE (1) Pre-Surgery Clear Ensure as directed.   o This drink was given to you during your hospital  pre-op appointment visit. o The pre-op nurse will instruct you on the time to drink the  Pre-Surgery Ensure depending on your surgery time. o Finish the drink by 7:30 AM the morning of your surgery o Nothing else to drink after completing the  Pre-Surgery Clear Ensure.         If you have questions, please contact your surgeon's office.     Take these medicines the morning of surgery with A SIP OF WATER  atenolol (TENORMIN) tamsulosin (FLOMAX)   As of today, STOP taking any Aspirin (unless otherwise instructed by your surgeon) etodolac (LODINE),  Aleve, Naproxen, Ibuprofen, Motrin, Advil, Goody's, BC's, all herbal medications, fish oil, and all vitamins.                      Do not wear jewelry, make up, or nail polish            Do not wear lotions, powders, perfumes/colognes, or deodorant.            Do not shave 48 hours prior to surgery.  Men may shave face and neck.            Do not  bring valuables to the hospital.            [facility] is not responsible for any belongings or valuables.  Do NOT Smoke (Tobacco/Vaping) or drink Alcohol 24 hours prior to your procedure If you use a CPAP at night, you may bring all equipment for your overnight stay.   Contacts, glasses, dentures or bridgework may not be worn into surgery.      For patients admitted to the hospital, discharge time will be determined by your treatment team.   Patients discharged the day of surgery will not be allowed to drive home, and someone needs to stay with them for 24 hours.    Special instructions:   [facility]- Preparing For Surgery  Before surgery, you can play an important role. Because skin is not sterile, your skin needs to be as free of germs as possible. You can reduce the number of germs on your skin by washing with CHG (chlorahexidine gluconate) Soap before surgery.  CHG is an antiseptic cleaner which kills germs and bonds with the skin to continue killing germs even after washing.    Oral Hygiene is also important to reduce your risk of infection.  Remember - BRUSH  YOUR TEETH THE MORNING OF SURGERY WITH YOUR REGULAR TOOTHPASTE  Please do not use if you have an allergy to CHG or antibacterial soaps. If your skin becomes reddened/irritated stop using the CHG.  Do not shave (including legs and underarms) for at least 48 hours prior to first CHG shower. It is OK to shave your face.  Please follow these instructions carefully.   1. Shower the NIGHT BEFORE SURGERY and the MORNING OF SURGERY with CHG Soap.   2. If you chose to wash your hair, wash your hair first as usual with your normal shampoo.  3. After you shampoo, rinse your hair and body thoroughly to remove the shampoo.  4. Use CHG as you would any other liquid soap. You can apply CHG directly to the skin and wash gently with a scrungie or a clean washcloth.   5. Apply the CHG Soap to your body ONLY FROM THE NECK DOWN.  Do not  use on open wounds or open sores. Avoid contact with your eyes, ears, mouth and genitals (private parts). Wash Face and genitals (private parts)  with your normal soap.   6. Wash thoroughly, paying special attention to the area where your surgery will be performed.  7. Thoroughly rinse your body with warm water from the neck down.  8. DO NOT shower/wash with your normal soap after using and rinsing off the CHG Soap.  9. Pat yourself dry with a CLEAN TOWEL.  10. Wear CLEAN PAJAMAS to bed the night before surgery  11. Place CLEAN SHEETS on your bed the night of your first shower and DO NOT SLEEP WITH PETS.   Day of Surgery: Wear Clean/Comfortable clothing the morning of surgery Do not apply any deodorants/lotions.   Remember to brush your teeth WITH YOUR REGULAR TOOTHPASTE.   Please read over the following fact sheets that you were given.

## 2020-04-30 ENCOUNTER — Other Ambulatory Visit (HOSPITAL_COMMUNITY)
Admission: RE | Admit: 2020-04-30 | Discharge: 2020-04-30 | Disposition: A | Discharge status: Home / Self Care | Payer: Medicare Other | Source: Ambulatory Visit | Attending: General Surgery | Admitting: General Surgery

## 2020-04-30 ENCOUNTER — Other Ambulatory Visit: Payer: Self-pay

## 2020-04-30 ENCOUNTER — Encounter (HOSPITAL_COMMUNITY): Payer: Self-pay

## 2020-04-30 ENCOUNTER — Encounter (HOSPITAL_COMMUNITY)
Admission: RE | Admit: 2020-04-30 | Discharge: 2020-04-30 | Disposition: A | Discharge status: Home / Self Care | Payer: Medicare Other | Source: Ambulatory Visit | Attending: General Surgery | Admitting: General Surgery

## 2020-04-30 DIAGNOSIS — Z01812 Encounter for preprocedural laboratory examination: Secondary | ICD-10-CM | POA: Diagnosis present

## 2020-04-30 DIAGNOSIS — K4090 Unilateral inguinal hernia, without obstruction or gangrene, not specified as recurrent: Secondary | ICD-10-CM | POA: Insufficient documentation

## 2020-04-30 DIAGNOSIS — Z20822 Contact with and (suspected) exposure to covid-19: Secondary | ICD-10-CM | POA: Insufficient documentation

## 2020-04-30 DIAGNOSIS — I10 Essential (primary) hypertension: Secondary | ICD-10-CM | POA: Insufficient documentation

## 2020-04-30 DIAGNOSIS — Z01818 Encounter for other preprocedural examination: Secondary | ICD-10-CM | POA: Insufficient documentation

## 2020-04-30 HISTORY — DX: Urgency of urination: R39.15

## 2020-04-30 HISTORY — DX: Malignant (primary) neoplasm, unspecified: C80.1

## 2020-04-30 HISTORY — DX: Unspecified osteoarthritis, unspecified site: M19.90

## 2020-04-30 HISTORY — DX: Personal history of urinary calculi: Z87.442

## 2020-04-30 HISTORY — DX: Other specified disorders of bladder: N32.89

## 2020-04-30 LAB — CBC
HCT: 48 % (ref 39.0–52.0)
Hemoglobin: 15.1 g/dL (ref 13.0–17.0)
MCH: 26.4 pg (ref 26.0–34.0)
MCHC: 31.5 g/dL (ref 30.0–36.0)
MCV: 84.1 fL (ref 80.0–100.0)
Platelets: 257 10*3/uL (ref 150–400)
RBC: 5.71 MIL/uL (ref 4.22–5.81)
RDW: 13.5 % (ref 11.5–15.5)
WBC: 10.8 10*3/uL — ABNORMAL HIGH (ref 4.0–10.5)
nRBC: 0 % (ref 0.0–0.2)

## 2020-04-30 LAB — BASIC METABOLIC PANEL
Anion gap: 10 (ref 5–15)
BUN: 13 mg/dL (ref 8–23)
CO2: 27 mmol/L (ref 22–32)
Calcium: 9.9 mg/dL (ref 8.9–10.3)
Chloride: 101 mmol/L (ref 98–111)
Creatinine, Ser: 1.06 mg/dL (ref 0.61–1.24)
GFR, Estimated: >60 mL/min (ref >60)
Glucose, Bld: 107 mg/dL — ABNORMAL HIGH (ref 70–99)
Potassium: 4.9 mmol/L (ref 3.5–5.1)
Sodium: 138 mmol/L (ref 135–145)

## 2020-04-30 LAB — SARS CORONAVIRUS 2 (TAT 6-24 HRS): SARS Coronavirus 2: NEGATIVE

## 2020-04-30 NOTE — Pre-Procedure Instructions (Signed)
Your procedure is scheduled on Tuesday January 25th.  [facility] at 8:30 A.M., and check in at the Admitting office. Your surgery or procedure is scheduled to begin at 10:30 AM  Call this number if you have problems the morning of surgery:  [phone]  Call [phone] if you have any questions prior to your surgery date Monday-Friday 8am-4pm    Remember:  Do not eat after midnight the night before your surgery  You may drink clear liquids until 7:30 the morning of your surgery.   Clear liquids allowed are: Water, Non-Citrus Juices (without pulp), Carbonated Beverages, Clear Tea, Black Coffee Only, and Gatorade  Patient Instructions   The night before surgery:  o No food after midnight. ONLY clear liquids after midnight   The day of surgery (if you do NOT have diabetes):  o Drink ONE (1) Pre-Surgery Clear Ensure as directed.   o This drink was given to you during your hospital  pre-op appointment visit. o The pre-op nurse will instruct you on the time to drink the  Pre-Surgery Ensure depending on your surgery time. o Finish the drink by 7:30 AM the morning of your surgery o Nothing else to drink after completing the  Pre-Surgery Clear Ensure.         If you have questions, please contact your surgeons office.     Take these medicines the morning of surgery with A SIP OF WATER  atenolol (TENORMIN) tamsulosin (FLOMAX)   As of today, STOP taking any Aspirin (unless otherwise instructed by your surgeon) etodolac (LODINE),  Aleve, Naproxen, Ibuprofen, Motrin, Advil, Goody's, BC's, all herbal medications, fish oil, and all vitamins.            Special instructions:   - Preparing For Surgery  Before surgery, you can play an important role. Because skin is not sterile, your skin needs to be as free of germs as possible. You can reduce the number of germs on your skin by washing with CHG (chlorahexidine gluconate) Soap before  surgery.  CHG is an antiseptic cleaner which kills germs and bonds with the skin to continue killing germs even after washing.    Oral Hygiene is also important to reduce your risk of infection.  Remember - BRUSH YOUR TEETH THE MORNING OF SURGERY WITH YOUR REGULAR TOOTHPASTE  Please do not use if you have an allergy to CHG or antibacterial soaps. If your skin becomes reddened/irritated stop using the CHG.  Do not shave (including legs and underarms) for at least 48 hours prior to first CHG shower. It is OK to shave your face.  Please follow these instructions carefully.   1. Shower the NIGHT BEFORE SURGERY and the MORNING OF SURGERY with CHG Soap.   2. If you chose to wash your hair, wash your hair first as usual with your normal shampoo.  3. After you shampoo,wash your face and private area with the soap you use at home, then rinse your hair and body thoroughly to remove the shampoo and soap.    4. Use CHG as you would any other liquid soap. You can apply CHG directly to the skin and wash gently with a scrungie or a clean washcloth.   5. Apply the CHG Soap to your body ONLY FROM THE NECK DOWN.  Do not use on open wounds or open sores. Avoid contact with your eyes, ears, mouth and genitals (private parts).    6. Wash thoroughly, paying special  attention to the area where your surgery will be performed.  7. Thoroughly rinse your body with warm water from the neck down.  8. DO NOT shower/wash with your normal soap after using and rinsing off the CHG Soap.  9. Pat yourself dry with a CLEAN TOWEL.  10. Wear CLEAN PAJAMAS to bed the night before surgery  11. Place CLEAN SHEETS on your bed the night of your first shower and DO NOT SLEEP WITH PETS.  Day of Surgery: Shower as instructed above. Wear Clean/Comfortable clothing the morning of surgery  Do not wear lotions, powders, perfumes/colognes, or deodorant. Remember to brush your teeth WITH YOUR REGULAR TOOTHPASTE.             Do not  wear jewelry, make up, or nail polish             Men may shave face and neck.            Do not bring valuables to the hospital.            [facility] is not responsible for any belongings or valuables.  Do NOT Smoke (Tobacco/Vaping) or drink Alcohol 24 hours prior to your procedure If you use a CPAP at night, you may bring all equipment for your overnight stay.   Contacts, glasses, dentures or bridgework may not be worn into surgery.      For patients admitted to the hospital, discharge time will be determined by your treatment team. Please read over the fact sheets that you were given.

## 2020-04-30 NOTE — Progress Notes (Addendum)
PCP - Dr. Elvia Collum  Cardiologist - patient states he saw a cardiologist years ago, but did not need follow. "The Dr.said that my heart beat irregularly sometimes, but it was the good kind of irregular."  "This was many years ago." Patient does not remember who the Dr. was.  Chest x-ray - na  EKG - 04/30/20  Stress Test - many years ago  ECHO - no  Cardiac Cath - no  Sleep Study - no CPAP - no  LABS-CBC, BMP  ASA-no  ERAS-yes  HA1C-na Fasting Blood Sugar - na Checks Blood Sugar ____0_ times a day  Anesthesia-  Pt denies having chest pain, sob, or fever at this time. All instructions explained to the pt, with a verbal understanding of the material. Pt agrees to go over the instructions while at home for a better understanding. Pt also instructed to self quarantine after being tested for COVID-19. The opportunity to ask questions was provided.

## 2020-05-03 NOTE — H&P (Signed)
History of Present Illness The patient is a 75 year old male who presents with an inguinal hernia. Referred by: Dr. Louis Meckel Chief Complaint: Left inguinal hernia  Patient is a 75 year old male, with history of hypertension, comes in with a one-year history of lifting more hernia. He states is been getting larger. He states that he is active, has had no significant pain to the area is worried since it is getting larger.  Patient has had previous bilateral hernia repairs approximately 20 years ago. He's had bladder surgery in the past.  Patient had no signs or symptoms of incarceration or strangulation.    Past Surgical History  Vasectomy   Diagnostic Studies History Colonoscopy  5-10 years ago  Allergies No Known Drug Allergies  [02/17/2020]: Allergies Reconciled   Medication History  Atenolol (100MG  Tablet, Oral) Active. Enalapril Maleate (10MG  Tablet, Oral) Active. Etodolac (400MG  Tablet, Oral) Active. hydroCHLOROthiazide (25MG  Tablet, Oral) Active. Tamsulosin HCl (0.4MG  Capsule, Oral) Active. Medications Reconciled  Social History  Tobacco use  Former smoker.  Other Problems  Arthritis  Hemorrhoids  High blood pressure  Kidney Stone     Review of Systems General Not Present- Appetite Loss, Chills, Fatigue, Fever, Night Sweats, Weight Gain and Weight Loss. HEENT Present- Hearing Loss. Not Present- Earache, Hoarseness, Nose Bleed, Oral Ulcers, Ringing in the Ears, Seasonal Allergies, Sinus Pain, Sore Throat, Visual Disturbances, Wears glasses/contact lenses and Yellow Eyes. Cardiovascular Not Present- Chest Pain, Difficulty Breathing Lying Down, Leg Cramps, Palpitations, Rapid Heart Rate, Shortness of Breath and Swelling of Extremities. Gastrointestinal Not Present- Abdominal Pain, Bloating, Bloody Stool, Change in Bowel Habits, Chronic diarrhea, Constipation, Difficulty Swallowing, Excessive gas, Gets full quickly at meals, Hemorrhoids,  Indigestion, Nausea, Rectal Pain and Vomiting. Male Genitourinary Present- Urine Leakage. Not Present- Blood in Urine, Change in Urinary Stream, Frequency, Impotence, Nocturia, Painful Urination and Urgency. Musculoskeletal Present- Joint Pain. Not Present- Back Pain, Joint Stiffness, Muscle Pain, Muscle Weakness and Swelling of Extremities. Neurological Not Present- Decreased Memory, Fainting, Headaches, Numbness, Seizures, Tingling, Tremor, Trouble walking and Weakness. Psychiatric Not Present- Anxiety, Bipolar, Change in Sleep Pattern, Depression, Fearful and Frequent crying. Endocrine Not Present- Cold Intolerance, Excessive Hunger, Hair Changes, Heat Intolerance, Hot flashes and New Diabetes. Hematology Not Present- Blood Thinners, Easy Bruising, Excessive bleeding, Gland problems, HIV and Persistent Infections. All other systems negative  Vitals  02/17/2020 8:41 AM Weight: 172.38 lb Height: 67in Body Surface Area: 1.9 m Body Mass Index: 27 kg/m  Temp.: 97.63F  Pulse: 65 (Regular)  BP: 122/74(Sitting, Left Arm, Standard)       Physical Exam  The physical exam findings are as follows: Note: Constitutional: No acute distress, conversant, appears stated age  Eyes: Anicteric sclerae, moist conjunctiva, no lid lag  Neck: No thyromegaly, trachea midline, no cervical lymphadenopathy  Lungs: Clear to auscultation biilaterally, normal respiratory effot  Cardiovascular: regular rate & rhythm, no murmurs, no peripheal edema, pedal pulses 2+  GI: Soft, no masses or hepatosplenomegaly, non-tender to palpation  MSK: Normal gait, no clubbing cyanosis, edema  Skin: No rashes, palpation reveals normal skin turgor  Psychiatric: Appropriate judgment and insight, oriented to person, place, and time  Abdomen Inspection Hernias - Left - Inguinal hernia - Reducible - Left.    Assessment & Plan  LEFT INGUINAL HERNIA (K40.90) Impression: 75 year old male  with a history of hypertension, left inguinal hernia  1. The patient will like to proceed to the operating room for robotic left inguinal hernia repair with mesh.  2. I discussed with the patient  the signs and symptoms of incarceration and strangulation and the need to proceed to the ER should they occur.  3. I discussed with the patient the risks and benefits of the procedure to include but not limited to: Infection, bleeding, damage to surrounding structures, possible need for further surgery, possible nerve pain, and possible recurrence. The patient was understanding and wishes to proceed.

## 2020-05-03 NOTE — Anesthesia Preprocedure Evaluation (Addendum)
Anesthesia Evaluation  Patient identified by MRN, date of birth, ID band Patient awake    Reviewed: Allergy & Precautions, NPO status , Patient's Chart, lab work & pertinent test results, reviewed documented beta blocker date and time   Airway Mallampati: II  TM Distance: >3 FB Neck ROM: Full    Dental no notable dental hx.    Pulmonary neg pulmonary ROS, former smoker,    Pulmonary exam normal        Cardiovascular hypertension, Pt. on medications and Pt. on home beta blockers Normal cardiovascular exam     Neuro/Psych negative neurological ROS     GI/Hepatic negative GI ROS, Neg liver ROS,   Endo/Other  negative endocrine ROS  Renal/GU negative Renal ROS     Musculoskeletal  (+) Arthritis ,   Abdominal   Peds  Hematology negative hematology ROS (+)   Anesthesia Other Findings   Reproductive/Obstetrics                            Anesthesia Physical Anesthesia Plan  ASA: II  Anesthesia Plan: General   Post-op Pain Management:    Induction: Intravenous  PONV Risk Score and Plan: 4 or greater and Ondansetron, Dexamethasone, Treatment may vary due to age or medical condition and Diphenhydramine  Airway Management Planned: Oral ETT  Additional Equipment:   Intra-op Plan:   Post-operative Plan: Extubation in OR  Informed Consent: I have reviewed the patients History and Physical, chart, labs and discussed the procedure including the risks, benefits and alternatives for the proposed anesthesia with the patient or authorized representative who has indicated his/her understanding and acceptance.     Dental advisory given  Plan Discussed with: Anesthesiologist and CRNA  Anesthesia Plan Comments:        Anesthesia Quick Evaluation

## 2020-05-04 ENCOUNTER — Other Ambulatory Visit: Payer: Self-pay

## 2020-05-04 ENCOUNTER — Ambulatory Visit (HOSPITAL_COMMUNITY): Payer: Medicare Other | Admitting: Anesthesiology

## 2020-05-04 ENCOUNTER — Ambulatory Visit (HOSPITAL_COMMUNITY)
Admission: RE | Admit: 2020-05-04 | Discharge: 2020-05-04 | Disposition: A | Discharge status: Home / Self Care | Payer: Medicare Other | Attending: General Surgery | Admitting: General Surgery

## 2020-05-04 ENCOUNTER — Ambulatory Visit (HOSPITAL_COMMUNITY): Payer: Medicare Other | Admitting: Physician Assistant

## 2020-05-04 ENCOUNTER — Encounter (HOSPITAL_COMMUNITY): Payer: Self-pay | Admitting: General Surgery

## 2020-05-04 ENCOUNTER — Encounter (HOSPITAL_COMMUNITY)
Admission: RE | Disposition: A | Discharge status: Home / Self Care | Payer: Self-pay | Source: Home / Self Care | Attending: General Surgery

## 2020-05-04 DIAGNOSIS — Z9852 Vasectomy status: Secondary | ICD-10-CM | POA: Diagnosis not present

## 2020-05-04 DIAGNOSIS — Z87891 Personal history of nicotine dependence: Secondary | ICD-10-CM | POA: Insufficient documentation

## 2020-05-04 DIAGNOSIS — K4030 Unilateral inguinal hernia, with obstruction, without gangrene, not specified as recurrent: Secondary | ICD-10-CM | POA: Insufficient documentation

## 2020-05-04 HISTORY — PX: INSERTION OF MESH: SHX5868

## 2020-05-04 HISTORY — PX: XI ROBOTIC ASSISTED INGUINAL HERNIA REPAIR WITH MESH: SHX6706

## 2020-05-04 SURGERY — REPAIR, HERNIA, INGUINAL, ROBOT-ASSISTED, LAPAROSCOPIC, USING MESH
Anesthesia: General | Site: Inguinal | Laterality: Left

## 2020-05-04 MED ORDER — CHLORHEXIDINE GLUCONATE CLOTH 2 % EX PADS: 6.0000 | MEDICATED_PAD | Freq: Once | CUTANEOUS | Status: DC

## 2020-05-04 MED ORDER — LACTATED RINGERS IV SOLN: INTRAVENOUS | Status: DC

## 2020-05-04 MED ORDER — FENTANYL CITRATE (PF) 250 MCG/5ML IJ SOLN
INTRAMUSCULAR | Status: DC | PRN
  Administered 2020-05-04: 100 ug via INTRAVENOUS

## 2020-05-04 MED ORDER — ACETAMINOPHEN 500 MG PO TABS: 1000.0000 mg | ORAL_TABLET | Freq: Once | ORAL | Status: DC

## 2020-05-04 MED ORDER — CEFAZOLIN SODIUM-DEXTROSE 2-4 GM/100ML-% IV SOLN
2.0000 g | INTRAVENOUS | Status: AC
  Administered 2020-05-04: 2 g via INTRAVENOUS
  Filled 2020-05-04: qty 100

## 2020-05-04 MED ORDER — ROCURONIUM BROMIDE 10 MG/ML (PF) SYRINGE
PREFILLED_SYRINGE | INTRAVENOUS | Status: DC | PRN
  Administered 2020-05-04: 80 mg via INTRAVENOUS
  Administered 2020-05-04: 20 mg via INTRAVENOUS

## 2020-05-04 MED ORDER — AMISULPRIDE (ANTIEMETIC) 5 MG/2ML IV SOLN: 10.0000 mg | Freq: Once | INTRAVENOUS | Status: DC | PRN

## 2020-05-04 MED ORDER — FENTANYL CITRATE (PF) 100 MCG/2ML IJ SOLN
INTRAMUSCULAR | Status: AC
  Filled 2020-05-04: qty 2

## 2020-05-04 MED ORDER — CELECOXIB 200 MG PO CAPS
200.0000 mg | ORAL_CAPSULE | Freq: Once | ORAL | Status: AC
  Administered 2020-05-04: 200 mg via ORAL
  Filled 2020-05-04: qty 1

## 2020-05-04 MED ORDER — PHENYLEPHRINE HCL-NACL 10-0.9 MG/250ML-% IV SOLN
INTRAVENOUS | Status: DC | PRN
  Administered 2020-05-04: 30 ug/min via INTRAVENOUS

## 2020-05-04 MED ORDER — CHLORHEXIDINE GLUCONATE 0.12 % MT SOLN
15.0000 mL | Freq: Once | OROMUCOSAL | Status: AC
  Administered 2020-05-04: 15 mL via OROMUCOSAL
  Filled 2020-05-04: qty 15

## 2020-05-04 MED ORDER — BUPIVACAINE HCL 0.25 % IJ SOLN
INTRAMUSCULAR | Status: DC | PRN
  Administered 2020-05-04: 10 mL

## 2020-05-04 MED ORDER — DEXAMETHASONE SODIUM PHOSPHATE 10 MG/ML IJ SOLN
INTRAMUSCULAR | Status: DC | PRN
  Administered 2020-05-04: 10 mg via INTRAVENOUS

## 2020-05-04 MED ORDER — BUPIVACAINE LIPOSOME 1.3 % IJ SUSP
20.0000 mL | INTRAMUSCULAR | Status: AC
  Administered 2020-05-04: 20 mL
  Filled 2020-05-04: qty 20

## 2020-05-04 MED ORDER — ORAL CARE MOUTH RINSE: 15.0000 mL | Freq: Once | OROMUCOSAL | Status: AC

## 2020-05-04 MED ORDER — DIPHENHYDRAMINE HCL 50 MG/ML IJ SOLN
INTRAMUSCULAR | Status: DC | PRN
  Administered 2020-05-04: 12.5 mg via INTRAVENOUS

## 2020-05-04 MED ORDER — FENTANYL CITRATE (PF) 250 MCG/5ML IJ SOLN
INTRAMUSCULAR | Status: AC
  Filled 2020-05-04: qty 5

## 2020-05-04 MED ORDER — SODIUM CHLORIDE 0.9% FLUSH
INTRAVENOUS | Status: DC | PRN
  Administered 2020-05-04 (×2): 10 mL

## 2020-05-04 MED ORDER — FENTANYL CITRATE (PF) 100 MCG/2ML IJ SOLN
25.0000 ug | INTRAMUSCULAR | Status: DC | PRN
  Administered 2020-05-04 (×2): 25 ug via INTRAVENOUS

## 2020-05-04 MED ORDER — 0.9 % SODIUM CHLORIDE (POUR BTL) OPTIME
TOPICAL | Status: DC | PRN
  Administered 2020-05-04: 1000 mL

## 2020-05-04 MED ORDER — ONDANSETRON HCL 4 MG/2ML IJ SOLN
INTRAMUSCULAR | Status: DC | PRN
  Administered 2020-05-04: 4 mg via INTRAVENOUS

## 2020-05-04 MED ORDER — PROPOFOL 10 MG/ML IV BOLUS
INTRAVENOUS | Status: AC
  Filled 2020-05-04: qty 40

## 2020-05-04 MED ORDER — SUGAMMADEX SODIUM 200 MG/2ML IV SOLN
INTRAVENOUS | Status: DC | PRN
  Administered 2020-05-04: 200 mg via INTRAVENOUS

## 2020-05-04 MED ORDER — TRAMADOL HCL 50 MG PO TABS: 50.0000 mg | ORAL_TABLET | Freq: Four times a day (QID) | ORAL | 0 refills | Status: DC | PRN

## 2020-05-04 MED ORDER — ENSURE PRE-SURGERY PO LIQD: 296.0000 mL | Freq: Once | ORAL | Status: DC

## 2020-05-04 MED ORDER — BUPIVACAINE HCL (PF) 0.25 % IJ SOLN
INTRAMUSCULAR | Status: AC
  Filled 2020-05-04: qty 30

## 2020-05-04 MED ORDER — PROPOFOL 10 MG/ML IV BOLUS
INTRAVENOUS | Status: DC | PRN
  Administered 2020-05-04: 140 mg via INTRAVENOUS

## 2020-05-04 MED ORDER — EPHEDRINE SULFATE-NACL 50-0.9 MG/10ML-% IV SOSY
PREFILLED_SYRINGE | INTRAVENOUS | Status: DC | PRN
  Administered 2020-05-04: 10 mg via INTRAVENOUS

## 2020-05-04 MED ORDER — PHENYLEPHRINE 40 MCG/ML (10ML) SYRINGE FOR IV PUSH (FOR BLOOD PRESSURE SUPPORT)
PREFILLED_SYRINGE | INTRAVENOUS | Status: DC | PRN
  Administered 2020-05-04: 80 ug via INTRAVENOUS

## 2020-05-04 MED ORDER — ACETAMINOPHEN 500 MG PO TABS
1000.0000 mg | ORAL_TABLET | ORAL | Status: AC
  Administered 2020-05-04: 1000 mg via ORAL
  Filled 2020-05-04: qty 2

## 2020-05-04 SURGICAL SUPPLY — 53 items
CHLORAPREP W/TINT 26 (MISCELLANEOUS) ×2 IMPLANT
COVER MAYO STAND STRL (DRAPES) ×2 IMPLANT
COVER SURGICAL LIGHT HANDLE (MISCELLANEOUS) ×2 IMPLANT
COVER TIP SHEARS 8 DVNC (MISCELLANEOUS) ×1 IMPLANT
COVER TIP SHEARS 8MM DA VINCI (MISCELLANEOUS) ×2
COVER WAND RF STERILE (DRAPES) IMPLANT
DECANTER SPIKE VIAL GLASS SM (MISCELLANEOUS) IMPLANT
DEFOGGER SCOPE WARMER CLEARIFY (MISCELLANEOUS) IMPLANT
DERMABOND ADVANCED (GAUZE/BANDAGES/DRESSINGS) ×1
DERMABOND ADVANCED .7 DNX12 (GAUZE/BANDAGES/DRESSINGS) ×1 IMPLANT
DEVICE TROCAR PUNCTURE CLOSURE (ENDOMECHANICALS) ×2 IMPLANT
DRAPE ARM DVNC X/XI (DISPOSABLE) ×4 IMPLANT
DRAPE COLUMN DVNC XI (DISPOSABLE) ×1 IMPLANT
DRAPE CV SPLIT W-CLR ANES SCRN (DRAPES) ×2 IMPLANT
DRAPE DA VINCI XI ARM (DISPOSABLE) ×8
DRAPE DA VINCI XI COLUMN (DISPOSABLE) ×2
DRAPE ORTHO SPLIT 77X108 STRL (DRAPES) ×2
DRAPE SURG ORHT 6 SPLT 77X108 (DRAPES) ×1 IMPLANT
ELECT REM PT RETURN 9FT ADLT (ELECTROSURGICAL) ×2
ELECTRODE REM PT RTRN 9FT ADLT (ELECTROSURGICAL) ×1 IMPLANT
GLOVE BIO SURGEON STRL SZ7.5 (GLOVE) ×2 IMPLANT
GOWN STRL REUS W/ TWL LRG LVL3 (GOWN DISPOSABLE) ×2 IMPLANT
GOWN STRL REUS W/ TWL XL LVL3 (GOWN DISPOSABLE) ×1 IMPLANT
GOWN STRL REUS W/TWL 2XL LVL3 (GOWN DISPOSABLE) ×2 IMPLANT
GOWN STRL REUS W/TWL LRG LVL3 (GOWN DISPOSABLE) ×4
GOWN STRL REUS W/TWL XL LVL3 (GOWN DISPOSABLE) ×2
KIT BASIN OR (CUSTOM PROCEDURE TRAY) ×2 IMPLANT
KIT TURNOVER KIT B (KITS) ×2 IMPLANT
MARKER SKIN DUAL TIP RULER LAB (MISCELLANEOUS) ×2 IMPLANT
MESH PROGRIP LAP SELF FIXATING (Mesh General) ×2 IMPLANT
MESH PROGRIP LAP SLF FIX 16X12 (Mesh General) ×1 IMPLANT
NEEDLE HYPO 22GX1.5 SAFETY (NEEDLE) ×2 IMPLANT
NEEDLE INSUFFLATION 14GA 120MM (NEEDLE) ×2 IMPLANT
OBTURATOR OPTICAL STANDARD 8MM (TROCAR)
OBTURATOR OPTICAL STND 8 DVNC (TROCAR)
OBTURATOR OPTICALSTD 8 DVNC (TROCAR) IMPLANT
PAD ARMBOARD 7.5X6 YLW CONV (MISCELLANEOUS) ×4 IMPLANT
SEAL CANN UNIV 5-8 DVNC XI (MISCELLANEOUS) ×3 IMPLANT
SEAL XI 5MM-8MM UNIVERSAL (MISCELLANEOUS) ×6
SET IRRIG TUBING LAPAROSCOPIC (IRRIGATION / IRRIGATOR) IMPLANT
SET TUBE SMOKE EVAC HIGH FLOW (TUBING) ×2 IMPLANT
STOPCOCK 4 WAY LG BORE MALE ST (IV SETS) ×2 IMPLANT
SUT MNCRL AB 4-0 PS2 18 (SUTURE) ×2 IMPLANT
SUT VIC AB 2-0 SH 27 (SUTURE) ×2
SUT VIC AB 2-0 SH 27X BRD (SUTURE) ×1 IMPLANT
SUT VLOC 180 2-0 6IN GS21 (SUTURE) IMPLANT
SUT VLOC 180 2-0 9IN GS21 (SUTURE) ×2 IMPLANT
SYR 30ML SLIP (SYRINGE) IMPLANT
SYR TOOMEY 50ML (SYRINGE) ×2 IMPLANT
TOWEL GREEN STERILE FF (TOWEL DISPOSABLE) ×2 IMPLANT
TRAY FOLEY MTR SLVR 14FR STAT (SET/KITS/TRAYS/PACK) IMPLANT
TRAY FOLEY MTR SLVR 16FR STAT (SET/KITS/TRAYS/PACK) IMPLANT
TRAY LAPAROSCOPIC MC (CUSTOM PROCEDURE TRAY) ×2 IMPLANT

## 2020-05-04 NOTE — Discharge Instructions (Signed)
[facility] _______[facility] °INGUINAL HERNIA REPAIR: POST OP INSTRUCTIONS ° °Always review your discharge instruction sheet given to you by the facility where your surgery was performed. °IF YOU HAVE DISABILITY OR FAMILY LEAVE FORMS, YOU MUST BRING THEM TO THE OFFICE FOR PROCESSING.   °DO NOT GIVE THEM TO YOUR DOCTOR. ° °1. A  prescription for pain medication may be given to you upon discharge.  Take your pain medication as prescribed, if needed.  If narcotic pain medicine is not needed, then you may take acetaminophen (Tylenol) or ibuprofen (Advil) as needed. °2. Take your usually prescribed medications unless otherwise directed. °If you need a refill on your pain medication, please contact your pharmacy.  They will contact our office to request authorization. Prescriptions will not be filled after 5 pm or on week-ends. °3. You should follow a light diet the first 24 hours after arrival home, such as soup and crackers, etc.  Be sure to include lots of fluids daily.  Resume your normal diet the day after surgery. °4.Most patients will experience some swelling and bruising around the umbilicus or in the groin and scrotum.  Ice packs and reclining will help.  Swelling and bruising can take several days to resolve.  °6. It is common to experience some constipation if taking pain medication after surgery.  Increasing fluid intake and taking a stool softener (such as Colace) will usually help or prevent this problem from occurring.  A mild laxative (Milk of Magnesia or Miralax) should be taken according to package directions if there are no bowel movements after 48 hours. °7. Unless discharge instructions indicate otherwise, you may remove your bandages 24-48 hours after surgery, and you may shower at that time.  You may have steri-strips (Merten skin tapes) in place directly over the incision.  These strips should be left on the skin for 7-10 days.  If your surgeon used skin glue on the incision, you may  shower in 24 hours.  The glue will flake off over the next 2-3 weeks.  Any sutures or staples will be removed at the office during your follow-up visit. °8. ACTIVITIES:  You may resume regular (light) daily activities beginning the next day--such as daily self-care, walking, climbing stairs--gradually increasing activities as tolerated.  You may have sexual intercourse when it is comfortable.  Refrain from any heavy lifting or straining until approved by your doctor. ° °a.You may drive when you are no longer taking prescription pain medication, you can comfortably wear a seatbelt, and you can safely maneuver your car and apply brakes. °b.RETURN TO WORK:   °_____________________________________________ ° °9.You should see your doctor in the office for a follow-up appointment approximately 2-3 weeks after your surgery.  Make sure that you call for this appointment within a day or two after you arrive home to insure a convenient appointment time. °10.OTHER INSTRUCTIONS: _________________________ °   _____________________________________ ° °WHEN TO CALL YOUR DOCTOR: °1. Fever over 101.0 °2. Inability to urinate °3. Nausea and/or vomiting °4. Extreme swelling or bruising °5. Continued bleeding from incision. °6. Increased pain, redness, or drainage from the incision ° °The clinic staff is available to answer your questions during regular business hours.  Please don’t hesitate to call and ask to speak to one of the nurses for clinical concerns.  If you have a medical emergency, go to the nearest emergency room or call 911.  A surgeon from [facility] is always on call at the hospital ° ° °[address], [address], [place], [place]  [address] ? ° [address] [address], [place], [place]   [address] °[phone] ? [phone] ? FAX [phone] °Web site: [email or web address] ° °

## 2020-05-04 NOTE — Op Note (Signed)
05/04/2020  8:59 AM  PATIENT:  Joshua Alvarez  75 y.o. adult  PRE-OPERATIVE DIAGNOSIS:  recurrent left inguinal hernia  POST-OPERATIVE DIAGNOSIS:  Incarcerated, recurrent, left, sliding inguinal hernia  PROCEDURE:  Procedure(s): XI ROBOTIC ASSISTED LEFT INGUINAL HERNIA REPAIR WITH MESH (Left) INSERTION OF MESH (Left)  SURGEON:  Surgeon(s) and Role:    Ralene Ok, MD - Primary  ASSISTANTS: Pryor Curia, RNFA   ANESTHESIA:   local and general  EBL:  minimal   BLOOD ADMINISTERED:none  DRAINS: none   LOCAL MEDICATIONS USED:  BUPIVICAINE  and OTHER exparil  SPECIMEN:  No Specimen  DISPOSITION OF SPECIMEN:  N/A  COUNTS:  YES  TOURNIQUET:  * No tourniquets in log *  DICTATION: .Dragon Dictation  Details of the procedure:   The patient was taken back to the operating room. The patient was placed in supine position with bilateral SCDs in place.   The patient was prepped and draped in the usual sterile fashion.  After appropriate anitbiotics were confirmed, a time-out was confirmed and all facts were verified.  At this time a Veress needle technique was used to inspect the abdomen approximately 10 cm from the valgus and the paramedian line. This time a 8 mm robotic trocar was placed into the abdomen. The camera was placed there was no injury to any intra-abdominal organs. A 40mm umbilical port was placed just superior to the umbilicus. An 8 mm port was placed approximately 10 cm lateral to the umbilicus on the left paramedian side.   Robot was positioned over the patient and the ports were docked in the usual fashion.  It could be easily seen that there was an incarerated segment of the sigmoid colon.  At this time the Left-sided peritoneum was taken down from the medial umbilical ligament laterally. The pre-peritoneal space was entered. Dissection was taken down to Cooper's ligament.  At this time proceeded clean out the rest Cooper's ligament and the medial to lateral  direction. A proceeded laterally to dissect the hernia from the internal inguinal ring.  This was fairly difficult as the sigmoid colon and hernia sac were incarcerated. It appeared as thought the hernia saw was attached to the previous mesh medially. The spermatic cord was circumferentially dissected away from the surrounding musculature and tissue. The vas deferens was identified and protected all portions of the case. There was a hole made in the peritoneum. This was dissected back. At this time I proceeded to create a pocket laterally for the mesh. Once the pocket was created the peritoneum was stripped back to approximately the base of the cord. At this time the piece of 16x12 Progrip mesh was in placed into the dissected area. This covered both the direct and indirect spaces appropriately. This also covered  The femoral space. At this time a 2 V- lock stitch was used to close the peritoneum in a standard running fashion.The hole in the peritoneum was closed using a locking running 2-0 vicryl.  At this time the robot was undocked. The umbilical port site was reapproximated using a 0 Vicryl via an Endo Close device 1. All ports were removed. The skin was reapproximated and all port sites using 4-0 Monocryl subcuticular fashion.  The patient the procedure well was taken to the recovery     PLAN OF CARE: Discharge to home after PACU  PATIENT DISPOSITION:  PACU - hemodynamically stable.   Delay start of Pharmacological VTE agent (>24hrs) due to surgical blood loss or risk of bleeding:  not applicable

## 2020-05-04 NOTE — Anesthesia Postprocedure Evaluation (Signed)
Anesthesia Post Note  Patient: Joshua Alvarez  Procedure(s) Performed: XI ROBOTIC ASSISTED LEFT INGUINAL HERNIA REPAIR WITH MESH (Left Inguinal) INSERTION OF MESH (Left Inguinal)     Patient location during evaluation: PACU Anesthesia Type: General Level of consciousness: sedated Pain management: pain level controlled Vital Signs Assessment: post-procedure vital signs reviewed and stable Respiratory status: spontaneous breathing and respiratory function stable Cardiovascular status: stable Postop Assessment: no apparent nausea or vomiting Anesthetic complications: no   No complications documented.  Last Vitals:  Vitals:   05/04/20 0930 05/04/20 0950  BP: 122/75 118/73  Pulse: 70 67  Resp: 16 17  Temp:  (!) 36.4 C  SpO2: 100% 100%    Last Pain:  Vitals:   05/04/20 0930  TempSrc:   PainSc: [place]

## 2020-05-04 NOTE — Interval H&P Note (Signed)
History and Physical Interval Note:  05/04/2020 7:09 AM  Joshua Alvarez  has presented today for surgery, with the diagnosis of recurrent left inguinal hernia.  The various methods of treatment have been discussed with the patient and family. After consideration of risks, benefits and other options for treatment, the patient has consented to  Procedure(s): XI ROBOTIC [place] (Left) as a surgical intervention.  The patient's history has been reviewed, patient examined, no change in status, stable for surgery.  I have reviewed the patient's chart and labs.  Questions were answered to the patient's satisfaction.     Ralene Ok

## 2020-05-04 NOTE — Anesthesia Procedure Notes (Signed)
Procedure Name: Intubation Date/Time: 05/04/2020 7:34 AM Performed by: Griffin Dakin, CRNA Pre-anesthesia Checklist: Patient identified, Emergency Drugs available, Suction available and Patient being monitored Patient Re-evaluated:Patient Re-evaluated prior to induction Oxygen Delivery Method: Circle system utilized Preoxygenation: Pre-oxygenation with 100% oxygen Induction Type: IV induction Ventilation: Mask ventilation without difficulty Laryngoscope Size: Mac and 4 Grade View: Grade I Tube type: Oral Tube size: 7.5 mm Number of attempts: 1 Airway Equipment and Method: Stylet and Oral airway Placement Confirmation: ETT inserted through vocal cords under direct vision,  positive ETCO2 and breath sounds checked- equal and bilateral Secured at: 23 cm Tube secured with: Tape Dental Injury: Teeth and Oropharynx as per pre-operative assessment

## 2020-05-04 NOTE — Transfer of Care (Signed)
Immediate Anesthesia Transfer of Care Note  Patient: Joshua Alvarez  Procedure(s) Performed: XI ROBOTIC ASSISTED LEFT INGUINAL HERNIA REPAIR WITH MESH (Left Inguinal) INSERTION OF MESH (Left Inguinal)  Patient Location: PACU  Anesthesia Type:General  Level of Consciousness: awake, alert  and oriented  Airway & Oxygen Therapy: Patient Spontanous Breathing and Patient connected to nasal cannula oxygen  Post-op Assessment: Report given to RN and Post -op Vital signs reviewed and stable  Post vital signs: Reviewed and stable  Last Vitals:  Vitals Value Taken Time  BP 128/85 05/04/20 0916  Temp    Pulse 65 05/04/20 0917  Resp 18 05/04/20 0917  SpO2 99 % 05/04/20 0917  Vitals shown include unvalidated device data.  Last Pain:  Vitals:   05/04/20 0624  TempSrc:   PainSc: 0-No pain         Complications: No complications documented.

## 2020-05-05 ENCOUNTER — Encounter (HOSPITAL_COMMUNITY): Payer: Self-pay | Admitting: General Surgery

## 2020-06-10 ENCOUNTER — Other Ambulatory Visit: Payer: Self-pay | Admitting: Family Medicine

## 2020-06-10 DIAGNOSIS — N400 Enlarged prostate without lower urinary tract symptoms: Secondary | ICD-10-CM

## 2020-08-11 ENCOUNTER — Ambulatory Visit: Payer: Medicare Other | Admitting: Family Medicine

## 2020-08-12 ENCOUNTER — Ambulatory Visit (INDEPENDENT_AMBULATORY_CARE_PROVIDER_SITE_OTHER): Payer: Medicare Other | Admitting: Family Medicine

## 2020-08-12 ENCOUNTER — Encounter: Payer: Self-pay | Admitting: Family Medicine

## 2020-08-12 ENCOUNTER — Other Ambulatory Visit: Payer: Self-pay

## 2020-08-12 VITALS — BP 124/80 | HR 65 | Temp 96.3°F | Ht 67.0 in | Wt 170.0 lb

## 2020-08-12 DIAGNOSIS — I10 Essential (primary) hypertension: Secondary | ICD-10-CM | POA: Diagnosis not present

## 2020-08-12 DIAGNOSIS — N400 Enlarged prostate without lower urinary tract symptoms: Secondary | ICD-10-CM | POA: Diagnosis not present

## 2020-08-12 DIAGNOSIS — Z789 Other specified health status: Secondary | ICD-10-CM | POA: Diagnosis not present

## 2020-08-12 DIAGNOSIS — E782 Mixed hyperlipidemia: Secondary | ICD-10-CM | POA: Diagnosis not present

## 2020-08-12 MED ORDER — ETODOLAC 400 MG PO TABS: 400.0000 mg | ORAL_TABLET | Freq: Two times a day (BID) | ORAL | 3 refills | Status: DC

## 2020-08-12 MED ORDER — HYDROCHLOROTHIAZIDE 25 MG PO TABS: 12.5000 mg | ORAL_TABLET | Freq: Every morning | ORAL | 1 refills | Status: DC

## 2020-08-12 MED ORDER — ATENOLOL 100 MG PO TABS: ORAL_TABLET | ORAL | 1 refills | Status: DC

## 2020-08-12 MED ORDER — ENALAPRIL MALEATE 10 MG PO TABS: ORAL_TABLET | ORAL | 1 refills | Status: DC

## 2020-08-12 MED ORDER — TAMSULOSIN HCL 0.4 MG PO CAPS
ORAL_CAPSULE | ORAL | 1 refills | Status: DC
Start: 2020-08-12 — End: 2021-05-05

## 2020-08-12 NOTE — Patient Instructions (Signed)
Cholesterol Content in Foods Cholesterol is a waxy, fat-like substance that helps to carry fat in the blood. The body needs cholesterol in [place]ock amounts, but too much cholesterol can cause damage to the arteries and heart. Most people should eat less than 200 milligrams (mg) of cholesterol a day. Foods with cholesterol Cholesterol is found in animal-based foods, such as meat, seafood, and dairy. Generally, low-fat dairy and lean meats have less cholesterol than full-fat dairy and fatty meats. The milligrams of cholesterol per serving (mg per serving) of common cholesterol-containing foods are listed below. Meat and other proteins  Egg -- one large whole egg has 186 mg.  Veal shank -- 4 oz has 141 mg.  Lean ground [place] (93% lean) -- 4 oz has 118 mg.  Fat-trimmed lamb loin -- 4 oz has 106 mg.  Lean ground beef (90% lean) -- 4 oz has 100 mg.  Lobster -- 3.5 oz has 90 mg.  Pork loin chops -- 4 oz has 86 mg.  Canned salmon -- 3.5 oz has 83 mg.  Fat-trimmed beef top loin -- 4 oz has 78 mg.  Frankfurter -- 1 frank (3.5 oz) has 77 mg.  Crab -- 3.5 oz has 71 mg.  Roasted chicken without skin, white meat -- 4 oz has 66 mg.  Light bologna -- 2 oz has 45 mg.  Deli-cut [place] -- 2 oz has 31 mg.  Canned tuna -- 3.5 oz has 31 mg.  Berniece Salines -- 1 oz has 29 mg.  Oysters and mussels (raw) -- 3.5 oz has 25 mg.  Mackerel -- 1 oz has 22 mg.  Trout -- 1 oz has 20 mg.  Pork sausage -- 1 link (1 oz) has 17 mg.  Salmon -- 1 oz has 16 mg.  Tilapia -- 1 oz has 14 mg. Dairy  Soft-serve ice cream --  cup (4 oz) has 103 mg.  Whole-milk yogurt -- 1 cup (8 oz) has 29 mg.  Cheddar cheese -- 1 oz has 28 mg.  American cheese -- 1 oz has 28 mg.  Whole milk -- 1 cup (8 oz) has 23 mg.  2% milk -- 1 cup (8 oz) has 18 mg.  Cream cheese -- 1 tablespoon (Tbsp) has 15 mg.  Cottage cheese --  cup (4 oz) has 14 mg.  Low-fat (1%) milk -- 1 cup (8 oz) has 10 mg.  Sour cream -- 1 Tbsp has 8.5  mg.  Low-fat yogurt -- 1 cup (8 oz) has 8 mg.  Nonfat Greek yogurt -- 1 cup (8 oz) has 7 mg.  Half-and-half cream -- 1 Tbsp has 5 mg. Fats and oils  Cod liver oil -- 1 tablespoon (Tbsp) has 82 mg.  Butter -- 1 Tbsp has 15 mg.  Lard -- 1 Tbsp has 14 mg.  Bacon grease -- 1 Tbsp has 14 mg.  Mayonnaise -- 1 Tbsp has 5-10 mg.  Margarine -- 1 Tbsp has 3-10 mg. Exact amounts of cholesterol in these foods may vary depending on specific ingredients and brands.   Foods without cholesterol Most plant-based foods do not have cholesterol unless you combine them with a food that has cholesterol. Foods without cholesterol include:  Grains and cereals.  Vegetables.  Fruits.  Vegetable oils, such as olive, canola, and sunflower oil.  Legumes, such as peas, beans, and lentils.  Nuts and seeds.  Egg whites.   Summary  The body needs cholesterol in [place]olls amounts, but too much cholesterol can cause damage to the arteries  and heart.  Most people should eat less than 200 milligrams (mg) of cholesterol a day. This information is not intended to replace advice given to you by your health care provider. Make sure you discuss any questions you have with your health care provider. Document Revised: 08/18/2019 Document Reviewed: 08/18/2019 Elsevier Patient Education  [place].

## 2020-08-12 NOTE — Progress Notes (Signed)
Patient ID: Joshua Alvarez, adult    DOB: 08-18-1945, 75 y.o.   MRN: [id number]   Chief Complaint  Patient presents with  . Hypertension   Subjective:    HPI Pt here for follow up on blood pressure. Pt states blood pressure has been doing good. Pt taking meds as directed.   HTN Pt compliant with BP meds.  No SEs Denies chest pain, sob, LE swelling, or blurry vision.  HLD- last labs in 11/21. Had elevated cholesterol and LDL. Pt has statin intolerance.  Per chart has tried many in past and had muscle and joint pains. Pt does eat red meat, eggs, and whole cheese in his diet.   Medical History Joshua Alvarez has a past medical history of Allergic rhinitis, Arthritis, Bladder spasms, Cancer ([place]), Colon polyp, History of kidney stones, Hypertension, Migraine headache, and Urgency of urination.   Outpatient Encounter Medications as of 08/12/2020  Medication Sig  . traMADol (ULTRAM) 50 MG tablet Take 1 tablet (50 mg total) by mouth every 6 (six) hours as needed.  . [DISCONTINUED] atenolol (TENORMIN) 100 MG tablet Take one half tablet by mouth daily (Patient taking differently: Take 50 mg by mouth daily.)  . [DISCONTINUED] enalapril (VASOTEC) 10 MG tablet TAKE ONE (1) TABLET BY MOUTH EVERY DAY (Patient taking differently: Take 10 mg by mouth daily.)  . [DISCONTINUED] etodolac (LODINE) 400 MG tablet TAKE ONE TABLET BY MOUTH TWICE A DAY WITH FOOD (Patient taking differently: Take 400 mg by mouth daily as needed for moderate pain.)  . [DISCONTINUED] hydrochlorothiazide (HYDRODIURIL) 25 MG tablet Take 0.5 tablets (12.5 mg total) by mouth every morning.  . [DISCONTINUED] tamsulosin (FLOMAX) 0.4 MG CAPS capsule TAKE 1 CAPSULE (0.4 MG TOTAL_ BY MOUTH DAILY  . atenolol (TENORMIN) 100 MG tablet Take one half tablet by mouth daily  . enalapril (VASOTEC) 10 MG tablet TAKE ONE (1) TABLET BY MOUTH EVERY DAY  . etodolac (LODINE) 400 MG tablet Take 1 tablet (400 mg total) by mouth 2 (two) times daily  with a meal.  . hydrochlorothiazide (HYDRODIURIL) 25 MG tablet Take 0.5 tablets (12.5 mg total) by mouth every morning.  . tamsulosin (FLOMAX) 0.4 MG CAPS capsule TAKE 1 CAPSULE (0.4 MG TOTAL_ BY MOUTH DAILY   No facility-administered encounter medications on file as of 08/12/2020.     Review of Systems  Constitutional: Negative for chills and fever.  HENT: Negative for congestion, rhinorrhea and sore throat.   Respiratory: Negative for cough, shortness of breath and wheezing.   Cardiovascular: Negative for chest pain and leg swelling.  Gastrointestinal: Negative for abdominal pain, diarrhea, nausea and vomiting.  Genitourinary: Negative for dysuria and frequency.  Musculoskeletal: Negative for arthralgias and back pain.  Skin: Negative for rash.  Neurological: Negative for dizziness, weakness and headaches.     Vitals BP 124/80   Pulse 65   Temp (!) 96.3 F (35.7 C)   Ht 5\' 7"  (1.702 m)   Wt 170 lb (77.1 kg)   SpO2 96%   BMI 26.63 kg/m   Objective:   Physical Exam Vitals and nursing note reviewed.  Constitutional:      General: He is not in acute distress.    Appearance: Normal appearance.  HENT:     Head: Normocephalic and atraumatic.  Cardiovascular:     Rate and Rhythm: Normal rate and regular rhythm.     Pulses: Normal pulses.     Heart sounds: Normal heart sounds. No murmur heard.   Pulmonary:  Effort: Pulmonary effort is normal. No respiratory distress.     Breath sounds: Normal breath sounds. No wheezing, rhonchi or rales.  Musculoskeletal:        General: Normal range of motion.     Right lower leg: No edema.     Left lower leg: No edema.  Skin:    General: Skin is warm and dry.     Findings: No lesion or rash.  Neurological:     General: No focal deficit present.     Mental Status: He is alert and oriented to person, place, and time.     Cranial Nerves: No cranial nerve deficit.  Psychiatric:        Mood and Affect: Mood normal.         Behavior: Behavior normal.        Thought Content: Thought content normal.        Judgment: Judgment normal.      Assessment and Plan   1. Essential hypertension, benign - hydrochlorothiazide (HYDRODIURIL) 25 MG tablet; Take 0.5 tablets (12.5 mg total) by mouth every morning.  Dispense: 45 tablet; Refill: 1 - enalapril (VASOTEC) 10 MG tablet; TAKE ONE (1) TABLET BY MOUTH EVERY DAY  Dispense: 90 tablet; Refill: 1 - atenolol (TENORMIN) 100 MG tablet; Take one half tablet by mouth daily  Dispense: 45 tablet; Refill: 1  2. Statin intolerance  3. Prostate hypertrophy - tamsulosin (FLOMAX) 0.4 MG CAPS capsule; TAKE 1 CAPSULE (0.4 MG TOTAL_ BY MOUTH DAILY  Dispense: 90 capsule; Refill: 1  4. Mixed hyperlipidemia   HLD- uncontrolled.  Statin intolerance, multiple meds in past. Pt given handout on low cholesterol diet.  Avoiding lots of eggs, red meats, and whole dairy products.  Inc in lean protein and veggies.  Inc in exercising.  Pt in agreement.  bph- stable. Cont flomax.  htn- stable. Cont meds.  Return in about 6 months (around 02/12/2021).

## 2020-09-13 ENCOUNTER — Ambulatory Visit: Payer: Medicare Other | Admitting: Family Medicine

## 2020-09-14 ENCOUNTER — Ambulatory Visit (HOSPITAL_COMMUNITY)
Admission: RE | Admit: 2020-09-14 | Discharge: 2020-09-14 | Disposition: A | Discharge status: Home / Self Care | Payer: Medicare Other | Source: Ambulatory Visit | Attending: Family Medicine | Admitting: Family Medicine

## 2020-09-14 ENCOUNTER — Other Ambulatory Visit: Payer: Self-pay

## 2020-09-14 ENCOUNTER — Encounter: Payer: Self-pay | Admitting: Family Medicine

## 2020-09-14 ENCOUNTER — Ambulatory Visit (INDEPENDENT_AMBULATORY_CARE_PROVIDER_SITE_OTHER): Payer: Medicare Other | Admitting: Family Medicine

## 2020-09-14 VITALS — HR 66 | Temp 99.0°F | Wt 169.0 lb

## 2020-09-14 DIAGNOSIS — R29898 Other symptoms and signs involving the musculoskeletal system: Secondary | ICD-10-CM | POA: Insufficient documentation

## 2020-09-14 DIAGNOSIS — R278 Other lack of coordination: Secondary | ICD-10-CM | POA: Diagnosis present

## 2020-09-14 DIAGNOSIS — R279 Unspecified lack of coordination: Secondary | ICD-10-CM | POA: Insufficient documentation

## 2020-09-14 DIAGNOSIS — J301 Allergic rhinitis due to pollen: Secondary | ICD-10-CM

## 2020-09-14 DIAGNOSIS — G459 Transient cerebral ischemic attack, unspecified: Secondary | ICD-10-CM

## 2020-09-14 NOTE — Progress Notes (Signed)
Patient ID: Joshua Alvarez, adult    DOB: Jan 26, 1946, 75 y.o.   MRN: [id number]   Chief Complaint  Patient presents with   limb incoordination- right arm   Subjective:    HPI Pt states that on May 28 he [place] the [place] and went to reach for glass but was unable to pick it up. States his "motor skill were not working". Episode happened only once and lasted for about 5 minutes. Pt states the pollen has him congested-blew leaves about 4-5 days ago.    Rt hand not having good coordination and glass on table and unable to pick it up. Seemed like right side not working correctly. Lasted about 50mins then and since then has been normal. No numbness or tingling, no speech issues, headache, gait problems.   Having lots of runny nose and pollen bothering him.  Some sneezing.  No coughing, fever, or sore throat. Taking zyrtec daily. Not tried nose sprays.  Medical History Kirsten has a past medical history of Allergic rhinitis, Arthritis, Bladder spasms, Cancer ([place]), Colon polyp, History of kidney stones, Hypertension, Migraine headache, and Urgency of urination.   Outpatient Encounter Medications as of 09/14/2020  Medication Sig   atenolol (TENORMIN) 100 MG tablet Take one half tablet by mouth daily   enalapril (VASOTEC) 10 MG tablet TAKE ONE (1) TABLET BY MOUTH EVERY DAY   etodolac (LODINE) 400 MG tablet Take 1 tablet (400 mg total) by mouth 2 (two) times daily with a meal.   hydrochlorothiazide (HYDRODIURIL) 25 MG tablet Take 0.5 tablets (12.5 mg total) by mouth every morning.   tamsulosin (FLOMAX) 0.4 MG CAPS capsule TAKE 1 CAPSULE (0.4 MG TOTAL_ BY MOUTH DAILY   [DISCONTINUED] traMADol (ULTRAM) 50 MG tablet Take 1 tablet (50 mg total) by mouth every 6 (six) hours as needed.   No facility-administered encounter medications on file as of 09/14/2020.     Review of Systems  Constitutional: Negative for chills and fever.  HENT: Negative for congestion,  rhinorrhea and sore throat.   Respiratory: Negative for cough, shortness of breath and wheezing.   Cardiovascular: Negative for chest pain and leg swelling.  Gastrointestinal: Negative for abdominal pain, diarrhea, nausea and vomiting.  Genitourinary: Negative for dysuria and frequency.  Musculoskeletal: Negative for arthralgias and back pain.  Skin: Negative for rash.  Neurological: Positive for weakness (rt hand weakness/uncoordinated). Negative for dizziness, seizures, syncope, speech difficulty, numbness and headaches.     Vitals Pulse 66   Temp 99 F (37.2 C)   Wt 169 lb (76.7 kg)   SpO2 99%   BMI 26.47 kg/m   Objective:   Physical Exam Vitals and nursing note reviewed.  Constitutional:      Appearance: Normal appearance.  HENT:     Head: Normocephalic and atraumatic.     Nose: Nose normal.     Mouth/Throat:     Mouth: Mucous membranes are moist.     Pharynx: Oropharynx is clear.  Eyes:     Extraocular Movements: Extraocular movements intact.     Conjunctiva/sclera: Conjunctivae normal.     Pupils: Pupils are equal, round, and reactive to light.  Cardiovascular:     Rate and Rhythm: Normal rate and regular rhythm.     Pulses: Normal pulses.     Heart sounds: Normal heart sounds.  Pulmonary:     Effort: Pulmonary effort is normal.     Breath sounds: Normal breath sounds. No wheezing, rhonchi or  rales.  Musculoskeletal:        General: Normal range of motion.     Right lower leg: No edema.     Left lower leg: No edema.  Skin:    General: Skin is warm and dry.     Findings: No lesion or rash.  Neurological:     General: No focal deficit present.     Mental Status: He is alert and oriented to person, place, and time.     Cranial Nerves: No cranial nerve deficit.     Sensory: No sensory deficit.     Motor: No weakness.     Coordination: Coordination normal.     Gait: Gait normal.     Comments: NIHSS- 0  Psychiatric:        Mood and Affect: Mood normal.         Behavior: Behavior normal.        Thought Content: Thought content normal.        Judgment: Judgment normal.     Assessment and Plan   1. TIA (transient ischemic attack) - CT Head Wo Contrast  2. Coordination problem - CT Head Wo Contrast  3. Weakness of right hand - CT Head Wo Contrast  4. Seasonal allergic rhinitis due to pollen  5. Limb incoordination - CT Head Wo Contrast   Possible TIA- Limb incoordination- rt hand- resolved. Ordered ct head stat.  Pt symptoms have fully resolved.  Sounds like a TIA and symptoms are no longer present.  Will order imaging.  NIHSS- 0 all back to normal and at baseline. Will call pt with results.  Allergic rhinitis- cont with zyrtec and add flonase prn.   Return if symptoms worsen or fail to improve.

## 2020-09-15 ENCOUNTER — Other Ambulatory Visit: Payer: Self-pay | Admitting: Family Medicine

## 2020-09-15 DIAGNOSIS — G459 Transient cerebral ischemic attack, unspecified: Secondary | ICD-10-CM

## 2020-09-15 MED ORDER — ASPIRIN 81 MG PO TBEC: 81.0000 mg | DELAYED_RELEASE_TABLET | Freq: Every day | ORAL | 0 refills | Status: DC

## 2020-09-15 NOTE — Progress Notes (Signed)
Had TIA and CT head is overall normal, would recommend 81mg  baby asa daily and neurology referral.   Dr. Lovena Le

## 2020-09-26 ENCOUNTER — Encounter: Payer: Self-pay | Admitting: Family Medicine

## 2020-12-14 ENCOUNTER — Encounter: Payer: Self-pay | Admitting: Emergency Medicine

## 2020-12-14 ENCOUNTER — Ambulatory Visit
Admission: EM | Admit: 2020-12-14 | Discharge: 2020-12-14 | Discharge status: Against Medical Advice | Payer: Medicare Other | Attending: Family Medicine | Admitting: Family Medicine

## 2020-12-14 ENCOUNTER — Ambulatory Visit
Admission: EM | Admit: 2020-12-14 | Discharge: 2020-12-14 | Disposition: A | Discharge status: Home / Self Care | Payer: Medicare Other | Attending: Family Medicine | Admitting: Family Medicine

## 2020-12-14 ENCOUNTER — Other Ambulatory Visit: Payer: Self-pay

## 2020-12-14 DIAGNOSIS — J0100 Acute maxillary sinusitis, unspecified: Secondary | ICD-10-CM

## 2020-12-14 DIAGNOSIS — I10 Essential (primary) hypertension: Secondary | ICD-10-CM

## 2020-12-14 MED ORDER — AMOXICILLIN 875 MG PO TABS: 875.0000 mg | ORAL_TABLET | Freq: Two times a day (BID) | ORAL | 0 refills | Status: AC

## 2020-12-14 NOTE — ED Provider Notes (Signed)
[facility]   [id number] 12/14/20 Arrival Time: [id number]  ASSESSMENT & PLAN:  1. Acute non-recurrent maxillary sinusitis   2. Elevated blood pressure reading in office with diagnosis of hypertension    Begin: Meds ordered this encounter  Medications   amoxicillin (AMOXIL) 875 MG tablet    Sig: Take 1 tablet (875 mg total) by mouth 2 (two) times daily for 10 days.    Dispense:  20 tablet    Refill:  0    Discharge Instructions      Your blood pressure was noted to be elevated during your visit today. If you are currently taking medication for high blood pressure, please ensure you are taking this as directed. If you do not have a history of high blood pressure and your blood pressure remains persistently elevated, you may need to begin taking a medication at some point. You may return here within the next few days to recheck if unable to see your primary care provider or if you do not have a one.  BP (!) 165/98 (BP Location: Right Arm)   Pulse 73   Temp 98.1 F (36.7 C) (Oral)   Resp 19   SpO2 98%   BP Readings from Last 3 Encounters:  12/14/20 (!) 165/98  08/12/20 124/80  05/04/20 118/73        Will plan f/u with PCP to recheck BP.  Follow-up Information     Elvia Collum M, DO.   Specialty: Family Medicine Why: If worsening or failing to improve as anticipated. Contact information: [place] [address] [phone]                 Reviewed expectations re: course of current medical issues. Questions answered. Outlined signs and symptoms indicating need for more acute intervention. Patient verbalized understanding. After Visit Summary given.   SUBJECTIVE: History from: patient.  Joshua Alvarez is a 75 y.o. adult who presents with complaint of nasal congestion, post-nasal drainage, and sinus pain. Onset gradual,  over one week . Respiratory symptoms: none. Fever: absent. Overall normal PO intake without n/v. OTC treatment:  none reported. History of frequent sinus infections: no. No specific aggravating or alleviating factors reported. Social History   Tobacco Use  Smoking Status Former   Years: 30.00   Types: Cigarettes   Quit date: 04/2010   Years since quitting: 10.6  Smokeless Tobacco Never   Increased blood pressure noted today. Reports that he is treated for HTN. He reports taking medications as instructed, no chest pain on exertion, no dyspnea on exertion, and no swelling of ankles.  OBJECTIVE:  Vitals:   12/14/20 1659 12/14/20 1701  BP:  (!) 165/98  Pulse:  73  Resp: 18 19  Temp:  98.1 F (36.7 C)  TempSrc:  Oral  SpO2:  98%     General appearance: alert; no distress HEENT: nasal congestion; clear runny nose; throat irritation secondary to post-nasal drainage; bilateral maxillary tenderness to palpation; turbinates boggy Neck: supple without LAD; trachea midline Lungs: unlabored respirations, symmetrical air entry; cough: absent; no respiratory distress Skin: warm and dry Psychological: alert and cooperative; normal mood and affect  Allergies  Allergen Reactions   Statins Other (See Comments)    Muscle aches joint pain unable to take any statins have tried several   Clarithromycin Swelling    Swelling face and in mouth    Past Medical History:  Diagnosis Date   Allergic rhinitis    Arthritis    Bladder  spasms    Cancer ([facility])    skin cancer   Colon polyp    History of kidney stones    x 3    Hypertension    Migraine headache    Urgency of urination    Family History  Problem Relation Age of Onset   Cancer Mother        Breast   Heart attack Father    Social History   Socioeconomic History   Marital status: Married    Spouse name: Not on file   Number of children: Not on file   Years of education: Not on file   Highest education level: Not on file  Occupational History   Not on file  Tobacco Use   Smoking status: Former    Years: 30.00    Types:  Cigarettes    Quit date: 04/2010    Years since quitting: 10.6   Smokeless tobacco: Never  Vaping Use   Vaping Use: Never used  Substance and Sexual Activity   Alcohol use: Not Currently   Drug use: Never   Sexual activity: Not on file  Other Topics Concern   Not on file  Social History Narrative   Not on file   Social Determinants of Health   Financial Resource Strain: Not on file  Food Insecurity: Not on file  Transportation Needs: Not on file  Physical Activity: Not on file  Stress: Not on file  Social Connections: Not on file  Intimate Partner Violence: Not on file             Vanessa Kick, MD 12/14/20 1821

## 2020-12-14 NOTE — ED Triage Notes (Signed)
Sinus pain and pressure x 1 week

## 2020-12-14 NOTE — Discharge Instructions (Addendum)
Your blood pressure was noted to be elevated during your visit today. If you are currently taking medication for high blood pressure, please ensure you are taking this as directed. If you do not have a history of high blood pressure and your blood pressure remains persistently elevated, you may need to begin taking a medication at some point. You may return here within the next few days to recheck if unable to see your primary care provider or if you do not have a one.  BP (!) 165/98 (BP Location: Right Arm)   Pulse 73   Temp 98.1 F (36.7 C) (Oral)   Resp 19   SpO2 98%   BP Readings from Last 3 Encounters:  12/14/20 (!) 165/98  08/12/20 124/80  05/04/20 118/73

## 2021-02-02 ENCOUNTER — Encounter (INDEPENDENT_AMBULATORY_CARE_PROVIDER_SITE_OTHER): Payer: Self-pay | Admitting: *Deleted

## 2021-02-15 ENCOUNTER — Other Ambulatory Visit: Payer: Self-pay | Admitting: Family Medicine

## 2021-02-15 DIAGNOSIS — I10 Essential (primary) hypertension: Secondary | ICD-10-CM

## 2021-03-09 ENCOUNTER — Other Ambulatory Visit: Payer: Self-pay | Admitting: Family Medicine

## 2021-03-09 DIAGNOSIS — N400 Enlarged prostate without lower urinary tract symptoms: Secondary | ICD-10-CM

## 2021-03-10 ENCOUNTER — Telehealth: Payer: Self-pay

## 2021-03-10 NOTE — Telephone Encounter (Signed)
Sent my chart message 03/10/21

## 2021-03-22 ENCOUNTER — Other Ambulatory Visit: Payer: Self-pay

## 2021-03-22 ENCOUNTER — Telehealth: Payer: Self-pay

## 2021-03-22 ENCOUNTER — Ambulatory Visit (INDEPENDENT_AMBULATORY_CARE_PROVIDER_SITE_OTHER): Payer: Medicare Other

## 2021-03-22 VITALS — BP 128/76 | HR 74 | Ht 67.0 in | Wt 175.2 lb

## 2021-03-22 DIAGNOSIS — L814 Other melanin hyperpigmentation: Secondary | ICD-10-CM | POA: Insufficient documentation

## 2021-03-22 DIAGNOSIS — D225 Melanocytic nevi of trunk: Secondary | ICD-10-CM | POA: Insufficient documentation

## 2021-03-22 DIAGNOSIS — D2370 Other benign neoplasm of skin of unspecified lower limb, including hip: Secondary | ICD-10-CM | POA: Insufficient documentation

## 2021-03-22 DIAGNOSIS — Z0000 Encounter for general adult medical examination without abnormal findings: Secondary | ICD-10-CM

## 2021-03-22 DIAGNOSIS — C4441 Basal cell carcinoma of skin of scalp and neck: Secondary | ICD-10-CM | POA: Insufficient documentation

## 2021-03-22 DIAGNOSIS — Z85828 Personal history of other malignant neoplasm of skin: Secondary | ICD-10-CM | POA: Insufficient documentation

## 2021-03-22 NOTE — Patient Instructions (Signed)
Joshua Alvarez , Thank you for taking time to come for your Medicare Wellness Visit. I appreciate your ongoing commitment to your health goals. Please review the following plan we discussed and let me know if I can assist you in the future.   Screening recommendations/referrals: Colonoscopy: Done 02/11/2014 Repeat in 5 years  Recommended yearly ophthalmology/optometry visit for glaucoma screening and checkup Recommended yearly dental visit for hygiene and checkup  Vaccinations:  Influenza vaccine: Due Repeat annually  Pneumococcal vaccine: Done 07/30/2014 and 10/02/2016 Tdap vaccine: Done 09/14/2009 Repeat in 10 years  Shingles vaccine: Shingrix discussed. Please contact your pharmacy for coverage information.     Covid-19: Done 05/14/2019 and 06/11/2019  Advanced directives: Please bring a copy of your health care power of attorney and living will to the office to be added to your chart at your convenience.   Conditions/risks identified: Aim for 30 minutes of exercise or brisk walking each day, drink 6-8 glasses of water and eat lots of fruits and vegetables.   Next appointment: Follow up in one year for your annual wellness visit. 2023.  Preventive Care 74 Years and Older, Male  Preventive care refers to lifestyle choices and visits with your health care provider that can promote health and wellness. What does preventive care include? A yearly physical exam. This is also called an annual well check. Dental exams once or twice a year. Routine eye exams. Ask your health care provider how often you should have your eyes checked. Personal lifestyle choices, including: Daily care of your teeth and gums. Regular physical activity. Eating a healthy diet. Avoiding tobacco and drug use. Limiting alcohol use. Practicing safe sex. Taking low doses of aspirin every day. Taking vitamin and mineral supplements as recommended by your health care provider. What happens during an annual well  check? The services and screenings done by your health care provider during your annual well check will depend on your age, overall health, lifestyle risk factors, and family history of disease. Counseling  Your health care provider may ask you questions about your: Alcohol use. Tobacco use. Drug use. Emotional well-being. Home and relationship well-being. Sexual activity. Eating habits. History of falls. Memory and ability to understand (cognition). Work and work Statistician. Screening  You may have the following tests or measurements: Height, weight, and BMI. Blood pressure. Lipid and cholesterol levels. These may be checked every 5 years, or more frequently if you are over 74 years old. Skin check. Lung cancer screening. You may have this screening every year starting at age 74 if you have a 30-pack-year history of smoking and currently smoke or have quit within the past 15 years. Fecal occult blood test (FOBT) of the stool. You may have this test every year starting at age 74. Flexible sigmoidoscopy or colonoscopy. You may have a sigmoidoscopy every 5 years or a colonoscopy every 10 years starting at age 74. Prostate cancer screening. Recommendations will vary depending on your family history and other risks. Hepatitis C blood test. Hepatitis B blood test. Sexually transmitted disease (STD) testing. Diabetes screening. This is done by checking your blood sugar (glucose) after you have not eaten for a while (fasting). You may have this done every 1-3 years. Abdominal aortic aneurysm (AAA) screening. You may need this if you are a current or former smoker. Osteoporosis. You may be screened starting at age 18 if you are at high risk. Talk with your health care provider about your test results, treatment options, and if necessary, the need for  more tests. Vaccines  Your health care provider may recommend certain vaccines, such as: Influenza vaccine. This is recommended every  year. Tetanus, diphtheria, and acellular pertussis (Tdap, Td) vaccine. You may need a Td booster every 10 years. Zoster vaccine. You may need this after age 74. Pneumococcal 13-valent conjugate (PCV13) vaccine. One dose is recommended after age 74. Pneumococcal polysaccharide (PPSV23) vaccine. One dose is recommended after age 74. Talk to your health care provider about which screenings and vaccines you need and how often you need them. This information is not intended to replace advice given to you by your health care provider. Make sure you discuss any questions you have with your health care provider. Document Released: 04/23/2015 Document Revised: 12/15/2015 Document Reviewed: 01/26/2015 Elsevier Interactive Patient Education  2017 [place] Prevention in the Home Falls can cause injuries. They can happen to people of all ages. There are many things you can do to make your home safe and to help prevent falls. What can I do on the outside of my home? Regularly fix the edges of walkways and driveways and fix any cracks. Remove anything that might make you trip as you walk through a door, such as a raised step or threshold. Trim any bushes or trees on the path to your home. Use bright outdoor lighting. Clear any walking paths of anything that might make someone trip, such as rocks or tools. Regularly check to see if handrails are loose or broken. Make sure that both sides of any steps have handrails. Any raised decks and porches should have guardrails on the edges. Have any leaves, snow, or ice cleared regularly. Use sand or salt on walking paths during winter. Clean up any spills in your garage right away. This includes oil or grease spills. What can I do in the bathroom? Use night lights. Install grab bars by the toilet and in the tub and shower. Do not use towel bars as grab bars. Use non-skid mats or decals in the tub or shower. If you need to sit down in the shower, use a  plastic, non-slip stool. Keep the floor dry. Clean up any water that spills on the floor as soon as it happens. Remove soap buildup in the tub or shower regularly. Attach bath mats securely with double-sided non-slip rug tape. Do not have throw rugs and other things on the floor that can make you trip. What can I do in the bedroom? Use night lights. Make sure that you have a light by your bed that is easy to reach. Do not use any sheets or blankets that are too big for your bed. They should not hang down onto the floor. Have a firm chair that has side arms. You can use this for support while you get dressed. Do not have throw rugs and other things on the floor that can make you trip. What can I do in the kitchen? Clean up any spills right away. Avoid walking on wet floors. Keep items that you use a lot in easy-to-reach places. If you need to reach something above you, use a strong step stool that has a grab bar. Keep electrical cords out of the way. Do not use floor polish or wax that makes floors slippery. If you must use wax, use non-skid floor wax. Do not have throw rugs and other things on the floor that can make you trip. What can I do with my stairs? Do not leave any items on the stairs. Make  sure that there are handrails on both sides of the stairs and use them. Fix handrails that are broken or loose. Make sure that handrails are as long as the stairways. Check any carpeting to make sure that it is firmly attached to the stairs. Fix any carpet that is loose or worn. Avoid having throw rugs at the top or bottom of the stairs. If you do have throw rugs, attach them to the floor with carpet tape. Make sure that you have a light switch at the top of the stairs and the bottom of the stairs. If you do not have them, ask someone to add them for you. What else can I do to help prevent falls? Wear shoes that: Do not have high heels. Have rubber bottoms. Are comfortable and fit you  well. Are closed at the toe. Do not wear sandals. If you use a stepladder: Make sure that it is fully opened. Do not climb a closed stepladder. Make sure that both sides of the stepladder are locked into place. Ask someone to hold it for you, if possible. Clearly mark and make sure that you can see: Any grab bars or handrails. First and last steps. Where the edge of each step is. Use tools that help you move around (mobility aids) if they are needed. These include: Canes. Walkers. Scooters. Crutches. Turn on the lights when you go into a dark area. Replace any light bulbs as soon as they burn out. Set up your furniture so you have a clear path. Avoid moving your furniture around. If any of your floors are uneven, fix them. If there are any pets around you, be aware of where they are. Review your medicines with your doctor. Some medicines can make you feel dizzy. This can increase your chance of falling. Ask your doctor what other things that you can do to help prevent falls. This information is not intended to replace advice given to you by your health care provider. Make sure you discuss any questions you have with your health care provider. Document Released: 01/21/2009 Document Revised: 09/02/2015 Document Reviewed: 05/01/2014 Elsevier Interactive Patient Education  2017 [employer].

## 2021-03-22 NOTE — Telephone Encounter (Signed)
Pt here for AWV.  Pt asks if he can get his prescription for his Tamsulosin 0.4 mg refilled with our office instead of his Urologist. Pt states he will not need another refill until March, as he gets a 90 day supply.

## 2021-03-22 NOTE — Progress Notes (Signed)
Subjective:   Joshua Alvarez is a 75 y.o. male who presents for Medicare Annual/Subsequent preventive examination.  Review of Systems     Cardiac Risk Factors include: advanced age (>60men, >61 women);hypertension;dyslipidemia;male gender     Objective:    Today's Vitals   03/22/21 1347  BP: 128/76  Pulse: 74  SpO2: 96%  Weight: 175 lb 3.2 oz (79.5 kg)  Height: 5\' 7"  (1.702 m)   Body mass index is 27.44 kg/m.  Advanced Directives 03/22/2021 05/04/2020 04/30/2020 02/11/2014  Does Patient Have a Medical Advance Directive? Yes No No No  Type of Paramedic of [place];Living will - - -  Copy of [place] in Chart? No - copy requested - - -  Would patient like information on creating a medical advance directive? No - Patient declined No - Patient declined No - Patient declined No - patient declined information    Current Medications (verified) Outpatient Encounter Medications as of 03/22/2021  Medication Sig   aspirin 81 MG EC tablet Take 1 tablet (81 mg total) by mouth daily. Swallow whole.   atenolol (TENORMIN) 100 MG tablet Take one half tablet by mouth daily   enalapril (VASOTEC) 10 MG tablet TAKE ONE (1) TABLET BY MOUTH EVERY DAY   etodolac (LODINE) 400 MG tablet Take 1 tablet (400 mg total) by mouth 2 (two) times daily with a meal.   hydrochlorothiazide (HYDRODIURIL) 25 MG tablet TAKE ONE-HALF TABLET (12.5MG  TOTAL) BY MOUTH EVERY MORNING   tamsulosin (FLOMAX) 0.4 MG CAPS capsule TAKE 1 CAPSULE (0.4 MG TOTAL_ BY MOUTH DAILY   No facility-administered encounter medications on file as of 03/22/2021.    Allergies (verified) Statins and Clarithromycin   History: Past Medical History:  Diagnosis Date   Allergic rhinitis    Arthritis    Bladder spasms    Cancer (HCC)    skin cancer   Colon polyp    History of kidney stones    x 3    Hypertension    Migraine headache    Urgency of urination    Past Surgical History:   Procedure Laterality Date   COLONOSCOPY     COLONOSCOPY N/A 02/11/2014   Procedure: COLONOSCOPY;  Surgeon: Rogene Houston, MD;  [facility];  Service: Endoscopy;  Laterality: N/A;  730   CYSTOSCOPY     x 2 - stone removal   FINGER SURGERY Right    right 5th finger   INGUINAL HERNIA REPAIR Bilateral    INSERTION OF MESH Left 05/04/2020   Procedure: INSERTION OF MESH;  Surgeon: Ralene Ok, MD;  Location: [place];  Service: General;  Laterality: Left;   RIGHT KNEE REPAIRED     XI ROBOTIC ASSISTED INGUINAL HERNIA REPAIR WITH MESH Left 05/04/2020   Procedure: XI ROBOTIC ASSISTED LEFT INGUINAL HERNIA REPAIR WITH MESH;  Surgeon: Ralene Ok, MD;  Location: [place];  Service: General;  Laterality: Left;   Family History  Problem Relation Age of Onset   Cancer Mother        Breast   Heart attack Father    Social History   Socioeconomic History   Marital status: Married    Spouse name: Ann   Number of children: 2   Years of education: Not on file   Highest education level: Not on file  Occupational History   Not on file  Tobacco Use   Smoking status: Former    Years: 30.00    Types: Cigarettes  Quit date: 04/2010    Years since quitting: 10.9   Smokeless tobacco: Never  Vaping Use   Vaping Use: Never used  Substance and Sexual Activity   Alcohol use: Not Currently   Drug use: Never   Sexual activity: Not on file  Other Topics Concern   Not on file  Social History Narrative   Retired from [employer] in Engineer, materials.   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   1 grandson and 1 granddaughter   2 step grandsons   Social Determinants of Radio broadcast assistant Strain: Low Risk    Difficulty of Paying Living Expenses: Not hard at all  Food Insecurity: No Food Insecurity   Worried About Charity fundraiser in the Last Year: Never true   Arboriculturist in the Last Year: Never true  Transportation Needs: No Transportation Needs   Lack of Transportation (Medical): No    Lack of Transportation (Non-Medical): No  Physical Activity: Insufficiently Active   Days of Exercise per Week: 4 days   Minutes of Exercise per Session: 30 min  Stress: No Stress Concern Present   Feeling of Stress : Not at all  Social Connections: Socially Integrated   Frequency of Communication with Friends and Family: More than three times a week   Frequency of Social Gatherings with Friends and Family: More than three times a week   Attends Religious Services: More than 4 times per year   Active Member of [employer] or Organizations: Yes   Attends Music therapist: More than 4 times per year   Marital Status: Married    Tobacco Counseling Counseling given: Not Answered   Clinical Intake:  Pre-visit preparation completed: Yes  Pain : No/denies pain     BMI - recorded: 27.44 Nutritional Status: BMI 25 -29 Overweight Nutritional Risks: None Diabetes: No  How often do you need to have someone help you when you read instructions, pamphlets, or other written materials from your doctor or pharmacy?: 1 - Never  Diabetic?No  Interpreter Needed?: No  Information entered by :: MJ Matia Zelada, LPN   Activities of Daily Living In your present state of health, do you have any difficulty performing the following activities: 03/22/2021 03/16/2021  Hearing? N N  Comment - -  Vision? N N  Difficulty concentrating or making decisions? N N  Walking or climbing stairs? N N  Dressing or bathing? N N  Doing errands, shopping? N N  Preparing Food and eating ? N N  Using the Toilet? N N  In the past six months, have you accidently leaked urine? N Y  Do you have problems with loss of bowel control? N N  Managing your Medications? N N  Managing your Finances? N N  Housekeeping or managing your Housekeeping? N N  Some recent data might be hidden    Patient Care Team: Coral Spikes, DO as PCP - General (Family Medicine)  Indicate any recent Medical Services you may have  received from other than [facility] providers in the past year (date may be approximate).     Assessment:   This is a routine wellness examination for Joshua Alvarez.  Hearing/Vision screen Hearing Screening - Comments:: Hearing aids Vision Screening - Comments:: Contacts. Dr. Charm Rings in [place] 11/2020.  Dietary issues and exercise activities discussed: Current Exercise Habits: Home exercise routine, Type of exercise: walking, Time (Minutes): 25, Frequency (Times/Week): 5, Weekly Exercise (Minutes/Week): 125, Intensity: Mild, Exercise limited by: cardiac condition(s)   Goals  Addressed             This Visit's Progress    Exercise 3x per week (30 min per time)       Pt would like to continue to exercise, work out in the yard, [employer].        Depression Screen PHQ 2/9 Scores 03/22/2021 09/14/2020 08/12/2020 02/10/2020 10/10/2017 10/02/2016 03/23/2016  PHQ - 2 Score 0 0 0 0 0 0 0    Fall Risk Fall Risk  03/22/2021 03/16/2021 09/14/2020 08/12/2020 02/10/2020  Falls in the past year? 0 0 0 0 0  Comment - - - - -  Number falls in past yr: 0 0 0 0 -  Injury with Fall? 0 0 0 0 -  Risk for fall due to : No Fall Risks - No Fall Risks No Fall Risks -  Follow up Falls prevention discussed - Falls evaluation completed Falls evaluation completed Falls evaluation completed    [place]:  Any stairs in or around the home? Yes  If so, are there any without handrails? No  Home free of loose throw rugs in walkways, pet beds, electrical cords, etc? Yes  Adequate lighting in your home to reduce risk of falls? Yes   ASSISTIVE DEVICES UTILIZED TO PREVENT FALLS:  Life alert? No  Use of a cane, walker or w/c? No  Grab bars in the bathroom? No  Shower chair or bench in shower? Yes  Elevated toilet seat or a handicapped toilet? No   TIMED UP AND GO:  Was the test performed? Yes .  Length of time to ambulate 10 feet: 10 sec.   Gait steady and fast without use of assistive  device  Cognitive Function:     6CIT Screen 03/22/2021  What Year? 0 points  What month? 0 points  What time? 0 points  Count back from 20 0 points  Months in reverse 0 points  Repeat phrase 4 points  Total Score 4    Immunizations Immunization History  Administered Date(s) Administered   Fluad Quad(high Dose 65+) 02/10/2020   Influenza, Seasonal, Injecte, Preservative Fre 01/08/2015   Influenza,inj,Quad PF,6+ Mos 01/02/2014, 03/22/2016, 04/05/2017, 03/11/2018   Moderna Sars-Covid-2 Vaccination 05/14/2019, 06/11/2019   Pneumococcal Conjugate-13 07/30/2014   Pneumococcal Polysaccharide-23 10/02/2016   Td 09/14/2009   Zoster, Live 05/26/2008    TDAP status: Due, Education has been provided regarding the importance of this vaccine. Advised may receive this vaccine at local pharmacy or Health Dept. Aware to provide a copy of the vaccination record if obtained from local pharmacy or Health Dept. Verbalized acceptance and understanding.  Flu Vaccine status: Due, Education has been provided regarding the importance of this vaccine. Advised may receive this vaccine at local pharmacy or Health Dept. Aware to provide a copy of the vaccination record if obtained from local pharmacy or Health Dept. Verbalized acceptance and understanding.  Pneumococcal vaccine status: Up to date  Covid-19 vaccine status: Completed vaccines  Qualifies for Shingles Vaccine? Yes   Zostavax completed Yes   Shingrix Completed?: No.    Education has been provided regarding the importance of this vaccine. Patient has been advised to call insurance company to determine out of pocket expense if they have not yet received this vaccine. Advised may also receive vaccine at local pharmacy or Health Dept. Verbalized acceptance and understanding.  Screening Tests Health Maintenance  Topic Date Due   Hepatitis C Screening  Never done   Zoster Vaccines-  Shingrix (1 of 2) Never done   MAMMOGRAM  Never done   DEXA  SCAN  Never done   COVID-19 Vaccine (3 - Moderna risk series) 07/09/2019   TETANUS/TDAP  09/15/2019   COLONOSCOPY (Pts 45-89yrs Insurance coverage will need to be confirmed)  02/12/2024   Pneumonia Vaccine 76+ Years old  Completed   INFLUENZA VACCINE  Completed   HPV VACCINES  Aged Out    Health Maintenance  Health Maintenance Due  Topic Date Due   Hepatitis C Screening  Never done   Zoster Vaccines- Shingrix (1 of 2) Never done   MAMMOGRAM  Never done   DEXA SCAN  Never done   COVID-19 Vaccine (3 - Moderna risk series) 07/09/2019   TETANUS/TDAP  09/15/2019    Colorectal cancer screening: Type of screening: Colonoscopy. Completed 02/11/2014. Repeat every 5 years  Lung Cancer Screening: (Low Dose CT Chest recommended if Age 71-80 years, 30 pack-year currently smoking OR have quit w/in 15years.) does not qualify.  Non smoker.  Additional Screening:  Hepatitis C Screening: does qualify; Completed DUE  Vision Screening: Recommended annual ophthalmology exams for early detection of glaucoma and other disorders of the eye. Is the patient up to date with their annual eye exam?  Yes  Who is the provider or what is the name of the office in which the patient attends annual eye exams? Dr. Ihor Gully in [place] If pt is not established with a provider, would they like to be referred to a provider to establish care? No .   Dental Screening: Recommended annual dental exams for proper oral hygiene  Community Resource Referral / Chronic Care Management: CRR required this visit?  No   CCM required this visit?  No      Plan:     I have personally reviewed and noted the following in the patients chart:   Medical and social history Use of alcohol, tobacco or illicit drugs  Current medications and supplements including opioid prescriptions. Patient is not currently taking opioid prescriptions. Functional ability and status Nutritional status Physical activity Advanced directives List  of other physicians Hospitalizations, surgeries, and ER visits in previous 12 months Vitals Screenings to include cognitive, depression, and falls Referrals and appointments  In addition, I have reviewed and discussed with patient certain preventive protocols, quality metrics, and best practice recommendations. A written personalized care plan for preventive services as well as general preventive health recommendations were provided to patient.     Chriss Driver, LPN   09/32/6712   Nurse Notes: IN PERSON VISIT AT [facility]. Pt has appointment with Dr. Ewell Poe office to schedule colonoscopy. Discussed Flu, Shingles and Tdap vaccines and how to obtain. 6CIT score of 4.

## 2021-04-22 ENCOUNTER — Other Ambulatory Visit: Payer: Self-pay | Admitting: Family Medicine

## 2021-04-22 DIAGNOSIS — I10 Essential (primary) hypertension: Secondary | ICD-10-CM

## 2021-04-27 NOTE — Telephone Encounter (Signed)
Sent message to schedule appointment 04/27/21

## 2021-05-05 NOTE — Telephone Encounter (Signed)
Has appointment on 05/16/21

## 2021-05-09 DIAGNOSIS — N210 Calculus in bladder: Secondary | ICD-10-CM | POA: Diagnosis not present

## 2021-05-09 DIAGNOSIS — N200 Calculus of kidney: Secondary | ICD-10-CM | POA: Diagnosis not present

## 2021-05-16 ENCOUNTER — Ambulatory Visit (INDEPENDENT_AMBULATORY_CARE_PROVIDER_SITE_OTHER): Payer: Medicare HMO | Admitting: Family Medicine

## 2021-05-16 ENCOUNTER — Other Ambulatory Visit: Payer: Self-pay

## 2021-05-16 VITALS — BP 119/82 | HR 68 | Temp 97.6°F | Ht 67.0 in | Wt 176.8 lb

## 2021-05-16 DIAGNOSIS — I10 Essential (primary) hypertension: Secondary | ICD-10-CM

## 2021-05-16 DIAGNOSIS — E782 Mixed hyperlipidemia: Secondary | ICD-10-CM

## 2021-05-16 DIAGNOSIS — R7303 Prediabetes: Secondary | ICD-10-CM

## 2021-05-16 DIAGNOSIS — N400 Enlarged prostate without lower urinary tract symptoms: Secondary | ICD-10-CM

## 2021-05-16 DIAGNOSIS — Z130 Encounter for screening for diseases of the blood and blood-forming organs and certain disorders involving the immune mechanism: Secondary | ICD-10-CM | POA: Diagnosis not present

## 2021-05-16 MED ORDER — ENALAPRIL MALEATE 10 MG PO TABS: 10.0000 mg | ORAL_TABLET | Freq: Every day | ORAL | 3 refills | Status: DC

## 2021-05-16 MED ORDER — ATENOLOL 100 MG PO TABS: ORAL_TABLET | ORAL | 3 refills | Status: DC

## 2021-05-16 MED ORDER — TAMSULOSIN HCL 0.4 MG PO CAPS: ORAL_CAPSULE | ORAL | 3 refills | Status: DC

## 2021-05-16 MED ORDER — HYDROCHLOROTHIAZIDE 25 MG PO TABS: ORAL_TABLET | ORAL | 3 refills | Status: DC

## 2021-05-16 NOTE — Progress Notes (Signed)
Subjective:  Patient ID: Joshua Alvarez, male    DOB: 05-22-1945  Age: 76 y.o. MRN: [id number]  CC: Chief Complaint  Patient presents with   Establish Care   Medication Refill    HPI:  76 year old male with BPH, history of skin cancer, hyperlipidemia, hypertension, history of TIA presents to establish care with me.  Needs medication refills.  Patient states that he is feeling well.  However, he is currently dealing with a kidney stone and is awaiting surgery.  Has flank pain intermittently.  Patient's hypertension is well controlled.  He is on atenolol, enalapril, and HCTZ.  Tolerating well.  Needs refills.  Hyperlipidemia is uncontrolled.  Patient's last LDL was 189.  He cannot tolerate statin therapy.  Patient follows with urology.  States that he has had a negative work-up for prostate cancer.  BPH is currently stable on Flomax.  Patient Active Problem List   Diagnosis Date Noted   Basal cell carcinoma of skin of scalp and neck 03/22/2021   History of malignant neoplasm of skin 03/22/2021   Statin intolerance 08/12/2020   Hyperlipidemia 11/19/2012   Essential hypertension, benign 11/19/2012   BPH (benign prostatic hyperplasia) 11/19/2012    Social Hx   Social History   Socioeconomic History   Marital status: Married    Spouse name: Ann   Number of children: 2   Years of education: Not on file   Highest education level: Not on file  Occupational History   Not on file  Tobacco Use   Smoking status: Former    Years: 30.00    Types: Cigarettes    Quit date: 04/2010    Years since quitting: 11.1   Smokeless tobacco: Never  Vaping Use   Vaping Use: Never used  Substance and Sexual Activity   Alcohol use: Not Currently   Drug use: Never   Sexual activity: Not on file  Other Topics Concern   Not on file  Social History Narrative   Retired from [employer] in Engineer, materials.   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   1 grandson and 1 granddaughter   2 step grandsons   Social  Determinants of Radio broadcast assistant Strain: Low Risk    Difficulty of Paying Living Expenses: Not hard at all  Food Insecurity: No Food Insecurity   Worried About Charity fundraiser in the Last Year: Never true   Arboriculturist in the Last Year: Never true  Transportation Needs: No Transportation Needs   Lack of Transportation (Medical): No   Lack of Transportation (Non-Medical): No  Physical Activity: Insufficiently Active   Days of Exercise per Week: 4 days   Minutes of Exercise per Session: 30 min  Stress: No Stress Concern Present   Feeling of Stress : Not at all  Social Connections: Socially Integrated   Frequency of Communication with Friends and Family: More than three times a week   Frequency of Social Gatherings with Friends and Family: More than three times a week   Attends Religious Services: More than 4 times per year   Active Member of [employer] or Organizations: Yes   Attends Music therapist: More than 4 times per year   Marital Status: Married    Review of Systems  Respiratory: Negative.    Cardiovascular: Negative.   Genitourinary:  Positive for flank pain.    Objective:  BP 119/82    Pulse 68    Temp 97.6 F (36.4 C) (Oral)  Ht '5\' 7"'  (1.702 m)    Wt 176 lb 12.8 oz (80.2 kg)    SpO2 98%    BMI 27.69 kg/m   BP/Weight 05/16/2021 73/71/0626 12/12/8544  Systolic BP 270 350 093  Diastolic BP 82 76 98  Wt. (Lbs) 176.8 175.2 -  BMI 27.69 27.44 -    Physical Exam Vitals and nursing note reviewed.  Constitutional:      Appearance: Normal appearance.  HENT:     Head: Normocephalic and atraumatic.  Eyes:     General:        Right eye: No discharge.        Left eye: No discharge.     Conjunctiva/sclera: Conjunctivae normal.  Cardiovascular:     Rate and Rhythm: Normal rate and regular rhythm.  Pulmonary:     Effort: Pulmonary effort is normal.     Breath sounds: Normal breath sounds. No wheezing, rhonchi or rales.  Neurological:      Mental Status: He is alert.  Psychiatric:        Mood and Affect: Mood normal.        Behavior: Behavior normal.      Lab Results  Component Value Date   WBC 10.8 (H) 04/30/2020   HGB 15.1 04/30/2020   HCT 48.0 04/30/2020   PLT 257 04/30/2020   GLUCOSE 107 (H) 04/30/2020   CHOL 268 (H) 02/10/2020   TRIG 133 02/10/2020   HDL 55 02/10/2020   LDLCALC 189 (H) 02/10/2020   ALT 15 02/10/2020   AST 24 02/10/2020   NA 138 04/30/2020   K 4.9 04/30/2020   CL 101 04/30/2020   CREATININE 1.06 04/30/2020   BUN 13 04/30/2020   CO2 27 04/30/2020   PSA 3.9 07/27/2014     Assessment & Plan:   Problem List Items Addressed This Visit       Cardiovascular and Mediastinum   Essential hypertension, benign - Primary    Well-controlled.  Continue enalapril, HCTZ, and atenolol.  Medications were refilled today.      Relevant Medications   atenolol (TENORMIN) 100 MG tablet   enalapril (VASOTEC) 10 MG tablet   hydrochlorothiazide (HYDRODIURIL) 25 MG tablet   Other Relevant Orders   CMP14+EGFR     Genitourinary   BPH (benign prostatic hyperplasia)    Stable.  Continue Flomax.      Relevant Medications   tamsulosin (FLOMAX) 0.4 MG CAPS capsule     Other   Hyperlipidemia    Uncontrolled.  Will check lipid panel.  We will discuss pharmacotherapy Zetia, PCSK9 once lab returns.      Relevant Medications   atenolol (TENORMIN) 100 MG tablet   enalapril (VASOTEC) 10 MG tablet   hydrochlorothiazide (HYDRODIURIL) 25 MG tablet   Other Relevant Orders   Lipid panel   Other Visit Diagnoses     Prediabetes       Relevant Orders   Hemoglobin A1c   Screening for deficiency anemia       Relevant Orders   CBC       Meds ordered this encounter  Medications   atenolol (TENORMIN) 100 MG tablet    Sig: Take one half tablet by mouth daily    Dispense:  45 tablet    Refill:  3   enalapril (VASOTEC) 10 MG tablet    Sig: Take 1 tablet (10 mg total) by mouth daily.    Dispense:  90  tablet    Refill:  3   hydrochlorothiazide (HYDRODIURIL)  25 MG tablet    Sig: TAKE ONE-HALF TABLET (12.5MG TOTAL) BY MOUTH EVERY MORNING    Dispense:  45 tablet    Refill:  3   tamsulosin (FLOMAX) 0.4 MG CAPS capsule    Sig: TAKE ONE CAPSULE (0.4MG TOTAL) BY MOUTH DAILY    Dispense:  90 capsule    Refill:  3    Follow-up:  Return in about 6 months (around 11/13/2021).  [place]

## 2021-05-16 NOTE — Patient Instructions (Signed)
Labs at your convenience (fasting).  Continue your meds. I have refilled them today.  Follow up in 6 months.

## 2021-05-16 NOTE — Assessment & Plan Note (Signed)
Well-controlled.  Continue enalapril, HCTZ, and atenolol.  Medications were refilled today.

## 2021-05-16 NOTE — Assessment & Plan Note (Signed)
Stable Continue Flomax 

## 2021-05-16 NOTE — Assessment & Plan Note (Signed)
Uncontrolled.  Will check lipid panel.  We will discuss pharmacotherapy Zetia, PCSK9 once lab returns.

## 2021-05-24 ENCOUNTER — Telehealth (INDEPENDENT_AMBULATORY_CARE_PROVIDER_SITE_OTHER): Payer: Self-pay | Admitting: *Deleted

## 2021-05-24 ENCOUNTER — Encounter (INDEPENDENT_AMBULATORY_CARE_PROVIDER_SITE_OTHER): Payer: Self-pay | Admitting: *Deleted

## 2021-05-24 MED ORDER — PEG 3350-KCL-NA BICARB-NACL 420 G PO SOLR: 4000.0000 mL | Freq: Once | ORAL | 0 refills | Status: AC

## 2021-05-24 NOTE — Telephone Encounter (Signed)
Patient needs trilyte 

## 2021-05-25 ENCOUNTER — Other Ambulatory Visit (INDEPENDENT_AMBULATORY_CARE_PROVIDER_SITE_OTHER): Payer: Self-pay

## 2021-05-25 DIAGNOSIS — Z86010 Personal history of colonic polyps: Secondary | ICD-10-CM

## 2021-05-30 DIAGNOSIS — Z130 Encounter for screening for diseases of the blood and blood-forming organs and certain disorders involving the immune mechanism: Secondary | ICD-10-CM | POA: Diagnosis not present

## 2021-05-30 DIAGNOSIS — R7303 Prediabetes: Secondary | ICD-10-CM | POA: Diagnosis not present

## 2021-05-30 DIAGNOSIS — E782 Mixed hyperlipidemia: Secondary | ICD-10-CM | POA: Diagnosis not present

## 2021-05-30 DIAGNOSIS — I10 Essential (primary) hypertension: Secondary | ICD-10-CM | POA: Diagnosis not present

## 2021-05-31 ENCOUNTER — Other Ambulatory Visit: Payer: Self-pay | Admitting: Family Medicine

## 2021-05-31 LAB — CMP14+EGFR
ALT: 11 IU/L (ref 0–44)
AST: 14 IU/L (ref 0–40)
Albumin/Globulin Ratio: 1.9 (ref 1.2–2.2)
Albumin: 4.2 g/dL (ref 3.7–4.7)
Alkaline Phosphatase: 99 IU/L (ref 44–121)
BUN/Creatinine Ratio: 10 (ref 10–24)
BUN: 11 mg/dL (ref 8–27)
Bilirubin Total: 0.6 mg/dL (ref 0.0–1.2)
CO2: 26 mmol/L (ref 20–29)
Calcium: 9.5 mg/dL (ref 8.6–10.2)
Chloride: 99 mmol/L (ref 96–106)
Creatinine, Ser: 1.09 mg/dL (ref 0.76–1.27)
Globulin, Total: 2.2 g/dL (ref 1.5–4.5)
Glucose: 104 mg/dL — ABNORMAL HIGH (ref 70–99)
Potassium: 4.5 mmol/L (ref 3.5–5.2)
Sodium: 138 mmol/L (ref 134–144)
Total Protein: 6.4 g/dL (ref 6.0–8.5)
eGFR: 71 mL/min/{1.73_m2} (ref >59)

## 2021-05-31 LAB — LIPID PANEL
Chol/HDL Ratio: 5 ratio (ref 0.0–5.0)
Cholesterol, Total: 222 mg/dL — ABNORMAL HIGH (ref 100–199)
HDL: 44 mg/dL (ref >39)
LDL Chol Calc (NIH): 153 mg/dL — ABNORMAL HIGH (ref 0–99)
Triglycerides: 140 mg/dL (ref 0–149)
VLDL Cholesterol Cal: 25 mg/dL (ref 5–40)

## 2021-05-31 LAB — CBC
Hematocrit: 43 % (ref 37.5–51.0)
Hemoglobin: 14.4 g/dL (ref 13.0–17.7)
MCH: 27.4 pg (ref 26.6–33.0)
MCHC: 33.5 g/dL (ref 31.5–35.7)
MCV: 82 fL (ref 79–97)
Platelets: 248 10*3/uL (ref 150–450)
RBC: 5.26 x10E6/uL (ref 4.14–5.80)
RDW: 13 % (ref 11.6–15.4)
WBC: 7.6 10*3/uL (ref 3.4–10.8)

## 2021-05-31 LAB — HEMOGLOBIN A1C
Est. average glucose Bld gHb Est-mCnc: 123 mg/dL
Hgb A1c MFr Bld: 5.9 % — ABNORMAL HIGH (ref 4.8–5.6)

## 2021-05-31 MED ORDER — EZETIMIBE 10 MG PO TABS: 10.0000 mg | ORAL_TABLET | Freq: Every day | ORAL | 3 refills | Status: DC

## 2021-06-09 ENCOUNTER — Telehealth (INDEPENDENT_AMBULATORY_CARE_PROVIDER_SITE_OTHER): Payer: Self-pay | Admitting: *Deleted

## 2021-06-09 NOTE — Telephone Encounter (Signed)
Referring MD/PCP: cook ? ?Procedure: tcs ? ?Reason/Indication:  hx polyps ? ?Has patient had this procedure before?  Yes, 02/2014 ? If so, when, by whom and where?   ? ?Is there a family history of colon cancer?  no ? Who?  What age when diagnosed?   ? ?Is patient diabetic? If yes, Type 1 or Type 2   no ?     ?Does patient have prosthetic heart valve or mechanical valve?  no ? ?Do you have a pacemaker/defibrillator?  no ? ?Has patient ever had endocarditis/atrial fibrillation? no ? ?Does patient use oxygen? no ? ?Has patient had joint replacement within last 12 months?  no ? ?Is patient constipated or do they take laxatives? no ? ?Does patient have a history of alcohol/drug use?  no ? ?Have you had a stroke/heart attack last 6 mths? no ? ?Do you take medicine for weight loss?  no ? ?For male patients,: have you had a hysterectomy  ?                     are you post menopausal  ?                     do you still have your menstrual cycle  ? ?Is patient on blood thinner such as Coumadin, Plavix and/or Aspirin? no ? ?Medications: hctz 25 mg 1/2 tab daily, enalapril 10 mg daily, atenolol 100 mg 1/2 tab daily, etodolac 400 mg daily, tamsulosin 0.4 mg daily ? ?Allergies: statins, clarithromycin ? ?Medication Adjustment per Dr Rehman/Dr Jenetta Downer  ? ?Procedure date & time: 07/06/21 ? ? ?

## 2021-06-13 ENCOUNTER — Other Ambulatory Visit: Payer: Self-pay | Admitting: Family Medicine

## 2021-06-13 DIAGNOSIS — I10 Essential (primary) hypertension: Secondary | ICD-10-CM

## 2021-06-15 DIAGNOSIS — N200 Calculus of kidney: Secondary | ICD-10-CM | POA: Diagnosis not present

## 2021-06-15 DIAGNOSIS — N210 Calculus in bladder: Secondary | ICD-10-CM | POA: Diagnosis not present

## 2021-06-17 DIAGNOSIS — N472 Paraphimosis: Secondary | ICD-10-CM | POA: Diagnosis not present

## 2021-06-17 DIAGNOSIS — R338 Other retention of urine: Secondary | ICD-10-CM | POA: Diagnosis not present

## 2021-06-23 DIAGNOSIS — N472 Paraphimosis: Secondary | ICD-10-CM | POA: Diagnosis not present

## 2021-06-23 DIAGNOSIS — R338 Other retention of urine: Secondary | ICD-10-CM | POA: Diagnosis not present

## 2021-06-23 DIAGNOSIS — N201 Calculus of ureter: Secondary | ICD-10-CM | POA: Diagnosis not present

## 2021-06-24 DIAGNOSIS — R338 Other retention of urine: Secondary | ICD-10-CM | POA: Diagnosis not present

## 2021-06-24 DIAGNOSIS — N401 Enlarged prostate with lower urinary tract symptoms: Secondary | ICD-10-CM | POA: Diagnosis not present

## 2021-07-01 ENCOUNTER — Encounter (HOSPITAL_COMMUNITY): Payer: 59

## 2021-07-01 DIAGNOSIS — R338 Other retention of urine: Secondary | ICD-10-CM | POA: Diagnosis not present

## 2021-07-06 ENCOUNTER — Encounter (HOSPITAL_COMMUNITY): Admission: RE | Payer: Self-pay | Source: Home / Self Care

## 2021-07-06 ENCOUNTER — Ambulatory Visit (HOSPITAL_COMMUNITY): Admission: RE | Admit: 2021-07-06 | Payer: Medicare HMO | Source: Home / Self Care | Admitting: Internal Medicine

## 2021-07-06 SURGERY — COLONOSCOPY WITH PROPOFOL
Anesthesia: Monitor Anesthesia Care

## 2021-07-12 DIAGNOSIS — R338 Other retention of urine: Secondary | ICD-10-CM | POA: Diagnosis not present

## 2021-07-13 ENCOUNTER — Other Ambulatory Visit: Payer: Self-pay | Admitting: *Deleted

## 2021-07-13 ENCOUNTER — Encounter: Payer: Self-pay | Admitting: Family Medicine

## 2021-07-13 ENCOUNTER — Ambulatory Visit (HOSPITAL_COMMUNITY)
Admission: RE | Admit: 2021-07-13 | Discharge: 2021-07-13 | Disposition: A | Discharge status: Home / Self Care | Payer: Medicare HMO | Source: Ambulatory Visit | Attending: Family Medicine | Admitting: Family Medicine

## 2021-07-13 ENCOUNTER — Ambulatory Visit (INDEPENDENT_AMBULATORY_CARE_PROVIDER_SITE_OTHER): Payer: Medicare HMO | Admitting: Family Medicine

## 2021-07-13 VITALS — BP 103/71 | HR 104 | Temp 97.8°F | Ht 67.0 in | Wt 166.0 lb

## 2021-07-13 DIAGNOSIS — I4891 Unspecified atrial fibrillation: Secondary | ICD-10-CM | POA: Diagnosis not present

## 2021-07-13 DIAGNOSIS — R059 Cough, unspecified: Secondary | ICD-10-CM | POA: Diagnosis not present

## 2021-07-13 DIAGNOSIS — R0602 Shortness of breath: Secondary | ICD-10-CM | POA: Diagnosis not present

## 2021-07-13 DIAGNOSIS — R051 Acute cough: Secondary | ICD-10-CM | POA: Insufficient documentation

## 2021-07-13 DIAGNOSIS — I10 Essential (primary) hypertension: Secondary | ICD-10-CM

## 2021-07-13 MED ORDER — APIXABAN 5 MG PO TABS: 5.0000 mg | ORAL_TABLET | Freq: Two times a day (BID) | ORAL | 3 refills | Status: DC

## 2021-07-13 MED ORDER — ATENOLOL 100 MG PO TABS: 100.0000 mg | ORAL_TABLET | Freq: Every day | ORAL | 1 refills | Status: DC

## 2021-07-13 NOTE — Patient Instructions (Signed)
Stop aspirin. ? ?Eliquis as prescribed. ? ?Atenolol 100 mg daily (1 tablet). ? ?I am arranging cardiology referral. ? ?Follow up next week. Call with concerns. ? ?Take care ? ?Dr. Lacinda Axon  ?

## 2021-07-14 ENCOUNTER — Other Ambulatory Visit: Payer: Self-pay | Admitting: Urology

## 2021-07-14 DIAGNOSIS — I4891 Unspecified atrial fibrillation: Secondary | ICD-10-CM | POA: Insufficient documentation

## 2021-07-14 DIAGNOSIS — R051 Acute cough: Secondary | ICD-10-CM | POA: Insufficient documentation

## 2021-07-14 MED ORDER — BENZONATATE 200 MG PO CAPS: 200.0000 mg | ORAL_CAPSULE | Freq: Three times a day (TID) | ORAL | 0 refills | Status: DC | PRN

## 2021-07-14 NOTE — Assessment & Plan Note (Signed)
Patient presented with cough.  Auscultation revealed an irregularly irregular rhythm.  EKG was obtained and revealed atrial fibrillation.  Increasing atenolol to 100 mg daily.  Referral to cardiology placed.  Patient's CHA2DS2-VASc score is 2.  Placing on Eliquis. ?

## 2021-07-14 NOTE — Progress Notes (Signed)
? ?Subjective:  ?Patient ID: Joshua Alvarez, male    DOB: 1945-06-19  Age: 76 y.o. MRN: [id number] ? ?CC: ?Chief Complaint  ?Patient presents with  ? Cough  ?  Chest congestion , drainage, chills x 3 days  ?Taking zyrtec   ? ? ?HPI: ? ?76 year old male presents for evaluation the above. ? ?Patient reports cough and congestion for the past 3 days.  He has been taking Zyrtec without relief.  No fever.  No relieving factors.  No other reported symptoms.  No other complaints. ? ?Patient Active Problem List  ? Diagnosis Date Noted  ? New onset atrial fibrillation ([place]) 07/14/2021  ? Acute cough 07/14/2021  ? Basal cell carcinoma of skin of scalp and neck 03/22/2021  ? Statin intolerance 08/12/2020  ? Hyperlipidemia 11/19/2012  ? Essential hypertension, benign 11/19/2012  ? BPH (benign prostatic hyperplasia) 11/19/2012  ? ? ?Social Hx   ?Social History  ? ?Socioeconomic History  ? Marital status: Married  ?  Spouse name: Joshua Alvarez  ? Number of children: 2  ? Years of education: Not on file  ? Highest education level: Not on file  ?Occupational History  ? Not on file  ?Tobacco Use  ? Smoking status: Former  ?  Years: 30.00  ?  Types: Cigarettes  ?  Quit date: 04/2010  ?  Years since quitting: 11.2  ? Smokeless tobacco: Never  ?Vaping Use  ? Vaping Use: Never used  ?Substance and Sexual Activity  ? Alcohol use: Not Currently  ? Drug use: Never  ? Sexual activity: Not on file  ?Other Topics Concern  ? Not on file  ?Social History Narrative  ? Retired from [employer] in Engineer, materials.  ? 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  ? 1 grandson and 1 granddaughter  ? 2 step grandsons  ? ?Social Determinants of Health  ? ?Financial Resource Strain: Low Risk   ? Difficulty of Paying Living Expenses: Not hard at all  ?Food Insecurity: No Food Insecurity  ? Worried About Charity fundraiser in the Last Year: Never true  ? Ran Out of Food in the Last Year: Never true  ?Transportation Needs: No Transportation Needs  ? Lack of Transportation (Medical): No  ?  Lack of Transportation (Non-Medical): No  ?Physical Activity: Insufficiently Active  ? Days of Exercise per Week: 4 days  ? Minutes of Exercise per Session: 30 min  ?Stress: No Stress Concern Present  ? Feeling of Stress : Not at all  ?Social Connections: Socially Integrated  ? Frequency of Communication with Friends and Family: More than three times a week  ? Frequency of Social Gatherings with Friends and Family: More than three times a week  ? Attends Religious Services: More than 4 times per year  ? Active Member of Clubs or Organizations: Yes  ? Attends Archivist Meetings: More than 4 times per year  ? Marital Status: Married  ? ? ?Review of Systems ?Per HPI ? ?Objective:  ?BP 103/71   Pulse (!) 104   Temp 97.8 ?F (36.6 ?C)   Ht '5\' 7"'$  (1.702 m)   Wt 166 lb (75.3 kg)   SpO2 97%   BMI 26.00 kg/m?  ? ? ?  07/13/2021  ?  2:26 PM 05/16/2021  ?  1:09 PM 03/22/2021  ?  1:47 PM  ?BP/Weight  ?Systolic BP 283 662 947  ?Diastolic BP 71 82 76  ?Wt. (Lbs) 166 176.8 175.2  ?BMI 26 kg/m2 27.69 kg/m2 27.44  kg/m2  ? ? ?Physical Exam ?Vitals and nursing note reviewed.  ?Constitutional:   ?   General: He is not in acute distress. ?   Appearance: Normal appearance.  ?HENT:  ?   Head: Normocephalic and atraumatic.  ?Eyes:  ?   General:     ?   Right eye: No discharge.     ?   Left eye: No discharge.  ?   Conjunctiva/sclera: Conjunctivae normal.  ?Cardiovascular:  ?   Comments: Irregularly irregular. ?Pulmonary:  ?   Effort: Pulmonary effort is normal.  ?   Breath sounds: Normal breath sounds. No wheezing, rhonchi or rales.  ?Neurological:  ?   Mental Status: He is alert.  ? ? ?Lab Results  ?Component Value Date  ? WBC 7.6 05/30/2021  ? HGB 14.4 05/30/2021  ? HCT 43.0 05/30/2021  ? PLT 248 05/30/2021  ? GLUCOSE 104 (H) 05/30/2021  ? CHOL 222 (H) 05/30/2021  ? TRIG 140 05/30/2021  ? HDL 44 05/30/2021  ? LDLCALC 153 (H) 05/30/2021  ? ALT 11 05/30/2021  ? AST 14 05/30/2021  ? NA 138 05/30/2021  ? K 4.5 05/30/2021  ? CL 99  05/30/2021  ? CREATININE 1.09 05/30/2021  ? BUN 11 05/30/2021  ? CO2 26 05/30/2021  ? PSA 3.9 07/27/2014  ? HGBA1C 5.9 (H) 05/30/2021  ? ?EKG: Interpretation -atrial fibrillation with irregular conduction.  Rate 100.  Poor R wave progression. ? ?Assessment & Plan:  ? ?Problem List Items Addressed This Visit   ? ?  ? Cardiovascular and Mediastinum  ? Essential hypertension, benign  ? Relevant Medications  ? apixaban (ELIQUIS) 5 MG TABS tablet  ? New onset atrial fibrillation ([place])  ?  Patient presented with cough.  Auscultation revealed an irregularly irregular rhythm.  EKG was obtained and revealed atrial fibrillation.  Increasing atenolol to 100 mg daily.  Referral to cardiology placed.  Patient's CHA2DS2-VASc score is 2.  Placing on Eliquis. ?  ?  ? Relevant Medications  ? apixaban (ELIQUIS) 5 MG TABS tablet  ? Other Relevant Orders  ? EKG 12-Lead (Completed)  ? Ambulatory referral to Cardiology  ?  ? Other  ? Acute cough - Primary  ?  Chest x-ray was obtained and was independent reviewed by me.  Interpretation: Chest x-ray normal.  Treating cough with Tessalon Perles. ?  ?  ? Relevant Orders  ? DG Chest 2 View (Completed)  ? ? ?Meds ordered this encounter  ?Medications  ? DISCONTD: atenolol (TENORMIN) 100 MG tablet  ?  Sig: Take 1 tablet (100 mg total) by mouth daily. TAKE ONE-HALF TABLET BY MOUTH DAILY.  ?  Dispense:  90 tablet  ?  Refill:  1  ? apixaban (ELIQUIS) 5 MG TABS tablet  ?  Sig: Take 1 tablet (5 mg total) by mouth 2 (two) times daily.  ?  Dispense:  60 tablet  ?  Refill:  3  ? benzonatate (TESSALON) 200 MG capsule  ?  Sig: Take 1 capsule (200 mg total) by mouth 3 (three) times daily as needed for cough.  ?  Dispense:  30 capsule  ?  Refill:  0  ? ?Thersa Salt DO ?[place] ? ?

## 2021-07-14 NOTE — Assessment & Plan Note (Signed)
Chest x-ray was obtained and was independent reviewed by me.  Interpretation: Chest x-ray normal.  Treating cough with Tessalon Perles. ?

## 2021-07-18 ENCOUNTER — Ambulatory Visit (INDEPENDENT_AMBULATORY_CARE_PROVIDER_SITE_OTHER): Payer: Medicare HMO | Admitting: Family Medicine

## 2021-07-18 VITALS — BP 126/88 | HR 60 | Temp 97.6°F | Ht 67.0 in | Wt 169.8 lb

## 2021-07-18 DIAGNOSIS — I4891 Unspecified atrial fibrillation: Secondary | ICD-10-CM | POA: Diagnosis not present

## 2021-07-18 DIAGNOSIS — R7303 Prediabetes: Secondary | ICD-10-CM | POA: Insufficient documentation

## 2021-07-18 NOTE — Progress Notes (Signed)
? ?Subjective:  ?Patient ID: Joshua Alvarez, male    DOB: 18-Mar-1946  Age: 76 y.o. MRN: [id number] ? ?CC: ?Chief Complaint  ?Patient presents with  ? Follow-up  ?  A-Fib  ? ? ?HPI: ? ?76 year old male with hypertension, BPH, hyperlipidemia, prediabetes presents for follow-up regarding recent new onset atrial fibrillation. ? ?Patient was seen on 4/5.  He complained of cough.  Auscultation revealed an irregularly irregular rhythm.  EKG confirmed atrial fibrillation.  Given patient's CHA2DS2-VASc score, patient was placed on anticoagulation.  Atenolol was increased.  Patient states that he is doing well.  No chest pain or shortness of breath.  He has no complaints or concerns at this time. ? ?Patient Active Problem List  ? Diagnosis Date Noted  ? Prediabetes 07/18/2021  ? New onset atrial fibrillation ([place]) 07/14/2021  ? Basal cell carcinoma of skin of scalp and neck 03/22/2021  ? Statin intolerance 08/12/2020  ? Hyperlipidemia 11/19/2012  ? Essential hypertension, benign 11/19/2012  ? BPH (benign prostatic hyperplasia) 11/19/2012  ? ? ?Social Hx   ?Social History  ? ?Socioeconomic History  ? Marital status: Married  ?  Spouse name: Lelon Frohlich  ? Number of children: 2  ? Years of education: Not on file  ? Highest education level: Not on file  ?Occupational History  ? Not on file  ?Tobacco Use  ? Smoking status: Former  ?  Years: 30.00  ?  Types: Cigarettes  ?  Quit date: 04/2010  ?  Years since quitting: 11.2  ? Smokeless tobacco: Never  ?Vaping Use  ? Vaping Use: Never used  ?Substance and Sexual Activity  ? Alcohol use: Not Currently  ? Drug use: Never  ? Sexual activity: Not on file  ?Other Topics Concern  ? Not on file  ?Social History Narrative  ? Retired from [employer] in Engineer, materials.  ? 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  ? 1 grandson and 1 granddaughter  ? 2 step grandsons  ? ?Social Determinants of Health  ? ?Financial Resource Strain: Low Risk   ? Difficulty of Paying Living Expenses: Not hard at all  ?Food Insecurity: No  Food Insecurity  ? Worried About Charity fundraiser in the Last Year: Never true  ? Ran Out of Food in the Last Year: Never true  ?Transportation Needs: No Transportation Needs  ? Lack of Transportation (Medical): No  ? Lack of Transportation (Non-Medical): No  ?Physical Activity: Insufficiently Active  ? Days of Exercise per Week: 4 days  ? Minutes of Exercise per Session: 30 min  ?Stress: No Stress Concern Present  ? Feeling of Stress : Not at all  ?Social Connections: Socially Integrated  ? Frequency of Communication with Friends and Family: More than three times a week  ? Frequency of Social Gatherings with Friends and Family: More than three times a week  ? Attends Religious Services: More than 4 times per year  ? Active Member of Clubs or Organizations: Yes  ? Attends Archivist Meetings: More than 4 times per year  ? Marital Status: Married  ? ? ?Review of Systems ?Per HPI ? ?Objective:  ?BP 126/88   Pulse 60   Temp 97.6 ?F (36.4 ?C) (Oral)   Ht '5\' 7"'$  (1.702 m)   Wt 169 lb 12.8 oz (77 kg)   SpO2 99%   BMI 26.59 kg/m?  ? ? ?  07/18/2021  ? 11:09 AM 07/13/2021  ?  2:26 PM 05/16/2021  ?  1:09 PM  ?  BP/Weight  ?Systolic BP 240 973 532  ?Diastolic BP 88 71 82  ?Wt. (Lbs) 169.8 166 176.8  ?BMI 26.59 kg/m2 26 kg/m2 27.69 kg/m2  ? ? ?Physical Exam ?Vitals and nursing note reviewed.  ?Constitutional:   ?   General: He is not in acute distress. ?   Appearance: Normal appearance. He is not ill-appearing.  ?HENT:  ?   Head: Normocephalic and atraumatic.  ?Eyes:  ?   General:     ?   Right eye: No discharge.     ?   Left eye: No discharge.  ?   Conjunctiva/sclera: Conjunctivae normal.  ?Cardiovascular:  ?   Rate and Rhythm: Normal rate and regular rhythm.  ?Pulmonary:  ?   Effort: Pulmonary effort is normal.  ?   Breath sounds: Normal breath sounds. No wheezing or rales.  ?Neurological:  ?   Mental Status: He is alert.  ?Psychiatric:     ?   Mood and Affect: Mood normal.     ?   Behavior: Behavior normal.   ? ? ?Lab Results  ?Component Value Date  ? WBC 7.6 05/30/2021  ? HGB 14.4 05/30/2021  ? HCT 43.0 05/30/2021  ? PLT 248 05/30/2021  ? GLUCOSE 104 (H) 05/30/2021  ? CHOL 222 (H) 05/30/2021  ? TRIG 140 05/30/2021  ? HDL 44 05/30/2021  ? LDLCALC 153 (H) 05/30/2021  ? ALT 11 05/30/2021  ? AST 14 05/30/2021  ? NA 138 05/30/2021  ? K 4.5 05/30/2021  ? CL 99 05/30/2021  ? CREATININE 1.09 05/30/2021  ? BUN 11 05/30/2021  ? CO2 26 05/30/2021  ? PSA 3.9 07/27/2014  ? HGBA1C 5.9 (H) 05/30/2021  ? ? ? ?Assessment & Plan:  ? ?Problem List Items Addressed This Visit   ? ?  ? Cardiovascular and Mediastinum  ? New onset atrial fibrillation ([place]) - Primary  ?  Patient back in sinus rhythm today.  Continue atenolol at 100 mg daily.  Continue Eliquis.  Arranging cardiology follow-up. ?  ?  ? Relevant Orders  ? EKG 12-Lead (Completed)  ? ? ?Follow-up: 3 to 6 months. ? ?Thersa Salt DO ?[place] ? ?

## 2021-07-18 NOTE — Assessment & Plan Note (Signed)
Patient back in sinus rhythm today.  Continue atenolol at 100 mg daily.  Continue Eliquis.  Arranging cardiology follow-up. ?

## 2021-07-18 NOTE — Patient Instructions (Signed)
Continue your current medications. ? ?We are working on the referral to Cardiology. ? ?Follow up in 3-6 months. ? ?Take care ? ?Dr. Lacinda Axon  ?

## 2021-07-19 ENCOUNTER — Ambulatory Visit: Payer: Medicare HMO | Admitting: Family Medicine

## 2021-07-26 DIAGNOSIS — N401 Enlarged prostate with lower urinary tract symptoms: Secondary | ICD-10-CM | POA: Diagnosis not present

## 2021-07-26 DIAGNOSIS — N200 Calculus of kidney: Secondary | ICD-10-CM | POA: Diagnosis not present

## 2021-07-26 DIAGNOSIS — R338 Other retention of urine: Secondary | ICD-10-CM | POA: Diagnosis not present

## 2021-07-28 ENCOUNTER — Encounter: Payer: Self-pay | Admitting: Cardiology

## 2021-07-28 NOTE — Progress Notes (Signed)
? ? ?Cardiology Office Note ? ?Date: 07/29/2021  ? ?ID: Joshua Alvarez, DOB [birth year], MRN [id number] ? ?PCP:  Joshua Spikes, DO  ?Cardiologist:  Joshua Lesches, Alvarez ?Electrophysiologist:  None  ? ?Chief Complaint  ?Patient presents with  ? Atrial Fibrillation  ? ? ?History of Present Illness: ?Joshua Alvarez is a 76 y.o. male referred for cardiology consultation by Joshua Alvarez for evaluation of recently documented atrial fibrillation.  He has been treated with atenolol and started on Eliquis for stroke prophylaxis. ? ?He recalls being told that he had atrial fibrillation approximately 40 years ago.  He has not had any obvious interval diagnosis however, states that he had a "mini stroke" in the last year when he was at the [place].  He does report intermittent sense of palpitations, this has improved however following treatment with atenolol.  He is tolerating Eliquis without any bleeding problems.  I did review his recent lab work.  If in fact he did have a TIA as described his CHA2DS2-VASc score is 4 at this point. ? ?I reviewed his tracing from April 5 showing atrial fibrillation with poor R wave progression.  Follow-up tracing on April 10 showed a return to sinus rhythm.  Today's tracing shows rate controlled atrial fibrillation at 72 bpm. ? ?He anticipates TURP in the next month, has a Foley catheter in place. ? ?He is retired from Engineer, materials at [employer]. ? ?Past Medical History:  ?Diagnosis Date  ? Allergic rhinitis   ? Arthritis   ? Bladder spasms   ? Colon polyp   ? History of kidney stones   ? x 3   ? Hypertension   ? Migraine headache   ? Paroxysmal atrial fibrillation ([facility])   ? Diagnosed April 2023  ? Skin cancer   ? Urgency of urination   ? ? ?Past Surgical History:  ?Procedure Laterality Date  ? COLONOSCOPY    ? COLONOSCOPY N/A 02/11/2014  ? Procedure: COLONOSCOPY;  Surgeon: Joshua Alvarez;  [facility];  Service: Endoscopy;  Laterality: N/A;  730  ? CYSTOSCOPY    ? x 2 - stone  removal  ? FINGER SURGERY Right   ? right 5th finger  ? INGUINAL HERNIA REPAIR Bilateral   ? INSERTION OF MESH Left 05/04/2020  ? Procedure: INSERTION OF MESH;  Surgeon: Joshua Alvarez;  Location: [facility];  Service: General;  Laterality: Left;  ? RIGHT KNEE REPAIRED    ? XI ROBOTIC ASSISTED INGUINAL HERNIA REPAIR WITH MESH Left 05/04/2020  ? Procedure: XI ROBOTIC ASSISTED LEFT INGUINAL HERNIA REPAIR WITH MESH;  Surgeon: Joshua Alvarez;  Location: [facility];  Service: General;  Laterality: Left;  ? ? ?Current Outpatient Medications  ?Medication Sig Dispense Refill  ? apixaban (ELIQUIS) 5 MG TABS tablet Take 1 tablet (5 mg total) by mouth 2 (two) times daily. 60 tablet 3  ? atenolol (TENORMIN) 100 MG tablet Take 50 mg by mouth daily.    ? enalapril (VASOTEC) 10 MG tablet Take 1 tablet (10 mg total) by mouth daily. 90 tablet 3  ? ezetimibe (ZETIA) 10 MG tablet Take 1 tablet (10 mg total) by mouth daily. 90 tablet 3  ? finasteride (PROSCAR) 5 MG tablet Take 5 mg by mouth daily.    ? hydrochlorothiazide (HYDRODIURIL) 25 MG tablet TAKE ONE-HALF TABLET (12.'5MG'$  TOTAL) BY MOUTH EVERY MORNING 45 tablet 3  ? tamsulosin (FLOMAX) 0.4 MG CAPS capsule TAKE ONE CAPSULE (0.'4MG'$  TOTAL) BY MOUTH DAILY 90  capsule 3  ? benzonatate (TESSALON) 200 MG capsule Take 1 capsule (200 mg total) by mouth 3 (three) times daily as needed for cough. (Patient not taking: Reported on 07/18/2021) 30 capsule 0  ? ?No current facility-administered medications for this visit.  ? ?Allergies:  Statins and Clarithromycin  ? ?Social History: The patient  reports that he quit smoking about 11 years ago. His smoking use included cigarettes. He has never used smokeless tobacco. He reports that he does not currently use alcohol. He reports that he does not use drugs.  ? ?Family History: The patient's family history includes Cancer in his mother; Heart attack in his father.  ? ?ROS: No orthopnea or PND.  No leg swelling.  No syncope. ? ?Physical Exam: ?VS:  BP  118/88   Pulse 83   Ht '5\' 7"'$  (1.702 m)   Wt 168 lb 9.6 oz (76.5 kg)   SpO2 98%   BMI 26.41 kg/m? , BMI Body mass index is 26.41 kg/m?. ? ?Wt Readings from Last 3 Encounters:  ?07/29/21 168 lb 9.6 oz (76.5 kg)  ?07/18/21 169 lb 12.8 oz (77 kg)  ?07/13/21 166 lb (75.3 kg)  ?  ?General: Patient appears comfortable at rest. ?HEENT: Conjunctiva and lids normal, oropharynx clear. ?Neck: Supple, no elevated JVP or carotid bruits, no thyromegaly. ?Lungs: Clear to auscultation, nonlabored breathing at rest. ?Cardiac: Irregularly irregular, no S3 or significant systolic murmur, no pericardial rub. ?Abdomen: Soft, nontender, bowel sounds present. ?Extremities: No pitting edema, distal pulses 2+. ?Skin: Warm and dry. ?Musculoskeletal: No kyphosis. ?Neuropsychiatric: Alert and oriented x3, affect grossly appropriate. ? ?ECG:  An ECG dated 07/18/2021 was personally reviewed today and demonstrated:  Sinus rhythm. ? ?Recent Labwork: ?05/30/2021: ALT 11; AST 14; BUN 11; Creatinine, Ser 1.09; Hemoglobin 14.4; Platelets 248; Potassium 4.5; Sodium 138  ?   ?Component Value Date/Time  ? CHOL 222 (H) 05/30/2021 0826  ? TRIG 140 05/30/2021 0826  ? HDL 44 05/30/2021 0826  ? CHOLHDL 5.0 05/30/2021 0826  ? CHOLHDL 3.7 12/23/2013 0809  ? VLDL 25 12/23/2013 0809  ? LDLCALC 153 (H) 05/30/2021 2706  ? ? ?Other Studies Reviewed Today: ? ?No prior cardiac testing for review. ? ?Assessment and Plan: ? ?1.  Paroxysmal atrial fibrillation with CHA2DS2-VASc score of 4.  Feels better in terms of palpitations on atenolol and is tolerating Eliquis so far.  I reviewed his baseline lab work from February.  ECG today shows rate controlled atrial fibrillation.  No change in current plan, agree with Joshua Alvarez management.  We will see him back over the next 3 to 4 months and likely repeat CBC and BMET around that time.  As far as upcoming TURP he will have to be off Eliquis at least 48 hours prior to the procedure.  We will obtain an echocardiogram to  assess cardiac structure and function as well. ? ?2.  Essential hypertension, on Vasotec and atenolol.  Blood pressure is normal today. ? ?Medication Adjustments/Labs and Tests Ordered: ?Current medicines are reviewed at length with the patient today.  Concerns regarding medicines are outlined above.  ? ?Tests Ordered: ?Orders Placed This Encounter  ?Procedures  ? EKG 12-Lead  ? ECHOCARDIOGRAM COMPLETE  ? ? ?Medication Changes: ?No orders of the defined types were placed in this encounter. ? ? ?Disposition:  Follow up  4 months. ? ?Signed, ?Satira Sark, Alvarez, [facility] ?07/29/2021 9:16 AM    ?[facility] ?[place], [place], [place] [address] ?  Phone: [phone]; Fax: [phone]  ?

## 2021-07-29 ENCOUNTER — Ambulatory Visit: Payer: Medicare HMO | Admitting: Cardiology

## 2021-07-29 ENCOUNTER — Ambulatory Visit (HOSPITAL_COMMUNITY)
Admission: RE | Admit: 2021-07-29 | Discharge: 2021-07-29 | Disposition: A | Discharge status: Home / Self Care | Payer: Medicare HMO | Source: Ambulatory Visit | Attending: Cardiology | Admitting: Cardiology

## 2021-07-29 ENCOUNTER — Encounter: Payer: Self-pay | Admitting: Cardiology

## 2021-07-29 VITALS — BP 118/88 | HR 83 | Ht 67.0 in | Wt 168.6 lb

## 2021-07-29 DIAGNOSIS — I480 Paroxysmal atrial fibrillation: Secondary | ICD-10-CM | POA: Diagnosis not present

## 2021-07-29 DIAGNOSIS — I10 Essential (primary) hypertension: Secondary | ICD-10-CM | POA: Diagnosis not present

## 2021-07-29 LAB — ECHOCARDIOGRAM COMPLETE
Area-P 1/2: 4.49 cm2
Height: 67 in
P 1/2 time: 777 msec
S' Lateral: 3.1 cm
Weight: 2697.6 oz

## 2021-07-29 NOTE — Progress Notes (Signed)
*  PRELIMINARY RESULTS* ?Echocardiogram ?2D Echocardiogram has been performed. ? ?[name] ?07/29/2021, 3:45 PM ?

## 2021-07-29 NOTE — Patient Instructions (Addendum)
Medication Instructions:  ?Your physician recommends that you continue on your current medications as directed. Please refer to the Current Medication list given to you today. ? ?Labwork: ?none ? ?Testing/Procedures: ?Your physician has requested that you have an echocardiogram. Echocardiography is a painless test that uses sound waves to create images of your heart. It provides your doctor with information about the size and shape of your heart and how well your heart?s chambers and valves are working. This procedure takes approximately one hour. There are no restrictions for this procedure. ? ?Follow-Up: ?Your physician recommends that you schedule a follow-up appointment in: 4 months [place] or [place]) ? ?Any Other Special Instructions Will Be Listed Below (If Applicable). ? ?If you need a refill on your cardiac medications before your next appointment, please call your pharmacy. ?

## 2021-08-03 DIAGNOSIS — R338 Other retention of urine: Secondary | ICD-10-CM | POA: Diagnosis not present

## 2021-08-04 ENCOUNTER — Emergency Department (HOSPITAL_COMMUNITY)
Admission: EM | Admit: 2021-08-04 | Discharge: 2021-08-04 | Disposition: A | Discharge status: Home / Self Care | Payer: Medicare HMO | Source: Home / Self Care | Attending: Emergency Medicine | Admitting: Emergency Medicine

## 2021-08-04 ENCOUNTER — Encounter (HOSPITAL_COMMUNITY): Payer: Self-pay | Admitting: Emergency Medicine

## 2021-08-04 ENCOUNTER — Other Ambulatory Visit: Payer: Self-pay

## 2021-08-04 DIAGNOSIS — Z79899 Other long term (current) drug therapy: Secondary | ICD-10-CM | POA: Insufficient documentation

## 2021-08-04 DIAGNOSIS — R339 Retention of urine, unspecified: Secondary | ICD-10-CM | POA: Insufficient documentation

## 2021-08-04 DIAGNOSIS — Z7901 Long term (current) use of anticoagulants: Secondary | ICD-10-CM | POA: Insufficient documentation

## 2021-08-04 DIAGNOSIS — D62 Acute posthemorrhagic anemia: Secondary | ICD-10-CM | POA: Diagnosis not present

## 2021-08-04 DIAGNOSIS — N320 Bladder-neck obstruction: Secondary | ICD-10-CM | POA: Diagnosis not present

## 2021-08-04 DIAGNOSIS — R310 Gross hematuria: Secondary | ICD-10-CM | POA: Diagnosis not present

## 2021-08-04 DIAGNOSIS — R109 Unspecified abdominal pain: Secondary | ICD-10-CM | POA: Insufficient documentation

## 2021-08-04 DIAGNOSIS — R319 Hematuria, unspecified: Secondary | ICD-10-CM | POA: Insufficient documentation

## 2021-08-04 DIAGNOSIS — R140 Abdominal distension (gaseous): Secondary | ICD-10-CM | POA: Insufficient documentation

## 2021-08-04 DIAGNOSIS — T83511A Infection and inflammatory reaction due to indwelling urethral catheter, initial encounter: Secondary | ICD-10-CM | POA: Diagnosis not present

## 2021-08-04 DIAGNOSIS — N179 Acute kidney failure, unspecified: Secondary | ICD-10-CM | POA: Diagnosis not present

## 2021-08-04 MED ORDER — MIRABEGRON ER 25 MG PO TB24
25.0000 mg | ORAL_TABLET | Freq: Every day | ORAL | Status: DC
  Administered 2021-08-04: 25 mg via ORAL
  Filled 2021-08-04: qty 1

## 2021-08-04 MED ORDER — HYDROCODONE-ACETAMINOPHEN 5-325 MG PO TABS
1.0000 | ORAL_TABLET | Freq: Once | ORAL | Status: AC
  Administered 2021-08-04: 1 via ORAL
  Filled 2021-08-04: qty 1

## 2021-08-04 MED ORDER — MIRABEGRON ER 25 MG PO TB24: 25.0000 mg | ORAL_TABLET | Freq: Every day | ORAL | 1 refills | Status: DC

## 2021-08-04 NOTE — Discharge Instructions (Signed)
You will have to keep the Foley catheter in for now since you are obstructed earlier.  We are prescribing a medicine to help with pain from bladder spasm and burning, with the catheter in place.  Call your urologist in the morning for follow-up appointment as soon as possible. ?

## 2021-08-04 NOTE — ED Triage Notes (Addendum)
Pt reports getting catheter removed yesterday. Pt was supposed to in and out cath self. Pt reports blood clots every time he tries to self cath. Pt reports taking eliquis  ?

## 2021-08-04 NOTE — ED Provider Notes (Signed)
?[facility] DEPT ?Provider Note ? ? ?CSN: [id number] ?Arrival date & time: 08/04/21  1638 ? ?  ? ?History ? ?Chief Complaint  ?Patient presents with  ? Clots in urine   ? ? ?Joshua Alvarez is a 76 y.o. male. ? ?HPI ?He presents for evaluation of inability to self catheterize with blood in urine, last time he catheterized that 2 PM today.  He had Foley catheter removed yesterday that had been placed for 1 month following laser treatment for kidney stones.  He denies fever, vomiting, dizziness or weakness ?  ? ?Home Medications ?Prior to Admission medications   ?Medication Sig Start Date End Date Taking? Authorizing Provider  ?apixaban (ELIQUIS) 5 MG TABS tablet Take 1 tablet (5 mg total) by mouth 2 (two) times daily. 07/13/21 08/12/21 Yes Cook, Jayce G, DO  ?atenolol (TENORMIN) 100 MG tablet Take 50 mg by mouth daily.   Yes [provider]  ?enalapril (VASOTEC) 10 MG tablet Take 1 tablet (10 mg total) by mouth daily. 05/16/21  Yes Cook, Michael Boston G, DO  ?finasteride (PROSCAR) 5 MG tablet Take 5 mg by mouth daily. 06/24/21  Yes [provider]  ?hydrochlorothiazide (HYDRODIURIL) 25 MG tablet TAKE ONE-HALF TABLET (12.'5MG'$  TOTAL) BY MOUTH EVERY MORNING 05/16/21  Yes Lacinda Axon, Jayce G, DO  ?mirabegron ER (MYRBETRIQ) 25 MG TB24 tablet Take 1 tablet (25 mg total) by mouth daily. As needed for bladder spasm or dysuria 08/04/21  Yes Daleen Bo, MD  ?tamsulosin (FLOMAX) 0.4 MG CAPS capsule TAKE ONE CAPSULE (0.'4MG'$  TOTAL) BY MOUTH DAILY 05/16/21  Yes Lacinda Axon, Jayce G, DO  ?benzonatate (TESSALON) 200 MG capsule Take 1 capsule (200 mg total) by mouth 3 (three) times daily as needed for cough. ?Patient not taking: Reported on 07/18/2021 07/14/21   Coral Spikes, DO  ?ezetimibe (ZETIA) 10 MG tablet Take 1 tablet (10 mg total) by mouth daily. ?Patient not taking: Reported on 08/04/2021 05/31/21   Coral Spikes, DO  ?   ? ?Allergies    ?Statins and Clarithromycin   ? ?Review of Systems   ?Review of  Systems ? ?Physical Exam ?Updated Vital Signs ?BP 124/81   Pulse (!) 102   Resp 20   SpO2 98%  ?Physical Exam ?Vitals and nursing note reviewed.  ?Constitutional:   ?   Appearance: He is well-developed. He is not ill-appearing.  ?HENT:  ?   Head: Normocephalic and atraumatic.  ?   Right Ear: External ear normal.  ?   Left Ear: External ear normal.  ?   Mouth/Throat:  ?   Mouth: Mucous membranes are moist.  ?Eyes:  ?   Conjunctiva/sclera: Conjunctivae normal.  ?   Pupils: Pupils are equal, round, and reactive to light.  ?Neck:  ?   Trachea: Phonation normal.  ?Cardiovascular:  ?   Rate and Rhythm: Normal rate.  ?Pulmonary:  ?   Effort: Pulmonary effort is normal.  ?Abdominal:  ?   General: There is distension.  ?   Palpations: Abdomen is soft.  ?   Tenderness: There is abdominal tenderness.  ?   Comments: Suprapubic fullness and tenderness.  ?Musculoskeletal:     ?   General: Normal range of motion.  ?   Cervical back: Normal range of motion and neck supple.  ?Skin: ?   General: Skin is warm and dry.  ?Neurological:  ?   Mental Status: He is alert and oriented to person, place, and time.  ?   Cranial Nerves:  No cranial nerve deficit.  ?   Sensory: No sensory deficit.  ?   Motor: No abnormal muscle tone.  ?   Coordination: Coordination normal.  ?Psychiatric:     ?   Mood and Affect: Mood normal.     ?   Behavior: Behavior normal.     ?   Thought Content: Thought content normal.     ?   Judgment: Judgment normal.  ? ? ?ED Results / Procedures / Treatments   ?Labs ?(all labs ordered are listed, but only abnormal results are displayed) ?Labs Reviewed - No data to display ? ?EKG ?None ? ?Radiology ?No results found. ? ?Procedures ?Procedures  ? ? ?Medications Ordered in ED ?Medications  ?mirabegron ER (MYRBETRIQ) tablet 25 mg (25 mg Oral Given 08/04/21 2151)  ?HYDROcodone-acetaminophen (NORCO/VICODIN) 5-325 MG per tablet 1 tablet (1 tablet Oral Given 08/04/21 1840)  ? ? ?ED Course/ Medical Decision Making/ A&P ?  ?                         ?Medical Decision Making ?Presenting for evaluation of difficulty catheterizing with blood noted in urine.  Bladder scan shows greater than 700 cc of urine in the urinary bladder.  Foley catheter ordered for drainage and will irrigate. ? ?Problems Addressed: ?Hematuria, unspecified type: acute illness or injury ?   Details: Onset after attempts at self catheterization in the last 24 hours ?Urinary retention: acute illness or injury ?   Details: Associated with hematuria, patient takes oral anticoagulant. ? ?Amount and/or Complexity of Data Reviewed ?Independent Historian:  ?   Details: He is a cogent historian ?External Data Reviewed: notes. ?   Details: Cardiology visit about 1 week ago for evaluation of atrial fibrillation and maintenance on Eliquis, to prevent stroke.  No changes in therapy at that time. ? ?Risk ?Prescription drug management. ?Decision regarding hospitalization. ?Risk Details: Bladder scan on arrival indicating greater than 700 cc of urine, irrigating Foley catheter placed, with irrigation, urine was released completely.  Bladder scan afterwards did not show any urine in the urinary bladder.  Patient developed a burning sensation after bladder irrigation, and was noted to have hematuria present in the urine bag.  He was treated with antispasmodic medication, and a prescription was sent to his pharmacy.  He was discharged with Foley catheter in place and advised to call his urologist in the morning to arrange follow-up care.  I do not think that he has lost a significant amount of blood and he is to continue his oral anticoagulation at this time.  He is to return here or call his doctor as needed for problems.  He does not require hospitalization. ? ? ? ? ? ? ? ? ? ? ?Final Clinical Impression(s) / ED Diagnoses ?Final diagnoses:  ?Urinary retention  ?Hematuria, unspecified type  ? ? ?Rx / DC Orders ?ED Discharge Orders   ? ?      Ordered  ?  mirabegron ER (MYRBETRIQ) 25 MG TB24  tablet  Daily       ? 08/04/21 2111  ? ?  ?  ? ?  ? ? ?  ?Daleen Bo, MD ?08/04/21 2310 ? ?

## 2021-08-05 ENCOUNTER — Ambulatory Visit: Payer: Medicare HMO | Admitting: Family Medicine

## 2021-08-05 ENCOUNTER — Inpatient Hospital Stay (HOSPITAL_COMMUNITY)
Admission: EM | Admit: 2021-08-05 | Discharge: 2021-08-14 | DRG: 698 | Disposition: A | Discharge status: Home Health | Payer: Medicare HMO | Attending: Internal Medicine | Admitting: Internal Medicine

## 2021-08-05 ENCOUNTER — Encounter (HOSPITAL_COMMUNITY): Payer: Self-pay

## 2021-08-05 ENCOUNTER — Other Ambulatory Visit: Payer: Self-pay

## 2021-08-05 DIAGNOSIS — D62 Acute posthemorrhagic anemia: Secondary | ICD-10-CM | POA: Diagnosis present

## 2021-08-05 DIAGNOSIS — N401 Enlarged prostate with lower urinary tract symptoms: Secondary | ICD-10-CM | POA: Diagnosis present

## 2021-08-05 DIAGNOSIS — N179 Acute kidney failure, unspecified: Secondary | ICD-10-CM | POA: Diagnosis not present

## 2021-08-05 DIAGNOSIS — Z79899 Other long term (current) drug therapy: Secondary | ICD-10-CM

## 2021-08-05 DIAGNOSIS — I482 Chronic atrial fibrillation, unspecified: Secondary | ICD-10-CM | POA: Diagnosis not present

## 2021-08-05 DIAGNOSIS — R6521 Severe sepsis with septic shock: Secondary | ICD-10-CM | POA: Diagnosis not present

## 2021-08-05 DIAGNOSIS — F05 Delirium due to known physiological condition: Secondary | ICD-10-CM | POA: Diagnosis not present

## 2021-08-05 DIAGNOSIS — E871 Hypo-osmolality and hyponatremia: Secondary | ICD-10-CM | POA: Diagnosis not present

## 2021-08-05 DIAGNOSIS — J811 Chronic pulmonary edema: Secondary | ICD-10-CM | POA: Diagnosis not present

## 2021-08-05 DIAGNOSIS — Z809 Family history of malignant neoplasm, unspecified: Secondary | ICD-10-CM

## 2021-08-05 DIAGNOSIS — T794XXA Traumatic shock, initial encounter: Secondary | ICD-10-CM | POA: Diagnosis present

## 2021-08-05 DIAGNOSIS — N320 Bladder-neck obstruction: Secondary | ICD-10-CM | POA: Diagnosis not present

## 2021-08-05 DIAGNOSIS — Z85828 Personal history of other malignant neoplasm of skin: Secondary | ICD-10-CM | POA: Diagnosis not present

## 2021-08-05 DIAGNOSIS — E785 Hyperlipidemia, unspecified: Secondary | ICD-10-CM | POA: Diagnosis not present

## 2021-08-05 DIAGNOSIS — Z87891 Personal history of nicotine dependence: Secondary | ICD-10-CM

## 2021-08-05 DIAGNOSIS — T8383XA Hemorrhage of genitourinary prosthetic devices, implants and grafts, initial encounter: Secondary | ICD-10-CM | POA: Diagnosis not present

## 2021-08-05 DIAGNOSIS — N3289 Other specified disorders of bladder: Secondary | ICD-10-CM | POA: Diagnosis present

## 2021-08-05 DIAGNOSIS — E8770 Fluid overload, unspecified: Secondary | ICD-10-CM | POA: Diagnosis not present

## 2021-08-05 DIAGNOSIS — I10 Essential (primary) hypertension: Secondary | ICD-10-CM | POA: Diagnosis not present

## 2021-08-05 DIAGNOSIS — J9601 Acute respiratory failure with hypoxia: Secondary | ICD-10-CM | POA: Diagnosis not present

## 2021-08-05 DIAGNOSIS — R58 Hemorrhage, not elsewhere classified: Principal | ICD-10-CM

## 2021-08-05 DIAGNOSIS — R339 Retention of urine, unspecified: Secondary | ICD-10-CM | POA: Diagnosis not present

## 2021-08-05 DIAGNOSIS — R310 Gross hematuria: Secondary | ICD-10-CM | POA: Diagnosis present

## 2021-08-05 DIAGNOSIS — Z8249 Family history of ischemic heart disease and other diseases of the circulatory system: Secondary | ICD-10-CM

## 2021-08-05 DIAGNOSIS — A4151 Sepsis due to Escherichia coli [E. coli]: Secondary | ICD-10-CM | POA: Diagnosis not present

## 2021-08-05 DIAGNOSIS — T83511A Infection and inflammatory reaction due to indwelling urethral catheter, initial encounter: Principal | ICD-10-CM | POA: Diagnosis present

## 2021-08-05 DIAGNOSIS — R338 Other retention of urine: Secondary | ICD-10-CM | POA: Diagnosis not present

## 2021-08-05 DIAGNOSIS — G9341 Metabolic encephalopathy: Secondary | ICD-10-CM | POA: Diagnosis not present

## 2021-08-05 DIAGNOSIS — R0600 Dyspnea, unspecified: Secondary | ICD-10-CM | POA: Diagnosis not present

## 2021-08-05 DIAGNOSIS — Z881 Allergy status to other antibiotic agents status: Secondary | ICD-10-CM

## 2021-08-05 DIAGNOSIS — M1990 Unspecified osteoarthritis, unspecified site: Secondary | ICD-10-CM | POA: Diagnosis not present

## 2021-08-05 DIAGNOSIS — Z888 Allergy status to other drugs, medicaments and biological substances status: Secondary | ICD-10-CM

## 2021-08-05 DIAGNOSIS — Z7901 Long term (current) use of anticoagulants: Secondary | ICD-10-CM | POA: Diagnosis not present

## 2021-08-05 DIAGNOSIS — N410 Acute prostatitis: Secondary | ICD-10-CM | POA: Diagnosis not present

## 2021-08-05 DIAGNOSIS — R0602 Shortness of breath: Secondary | ICD-10-CM | POA: Diagnosis not present

## 2021-08-05 DIAGNOSIS — I480 Paroxysmal atrial fibrillation: Secondary | ICD-10-CM | POA: Diagnosis not present

## 2021-08-05 DIAGNOSIS — R319 Hematuria, unspecified: Secondary | ICD-10-CM | POA: Diagnosis not present

## 2021-08-05 DIAGNOSIS — Y731 Therapeutic (nonsurgical) and rehabilitative gastroenterology and urology devices associated with adverse incidents: Secondary | ICD-10-CM | POA: Diagnosis not present

## 2021-08-05 DIAGNOSIS — R059 Cough, unspecified: Secondary | ICD-10-CM | POA: Diagnosis not present

## 2021-08-05 DIAGNOSIS — N138 Other obstructive and reflux uropathy: Secondary | ICD-10-CM | POA: Diagnosis not present

## 2021-08-05 LAB — CBC WITH DIFFERENTIAL/PLATELET
Abs Immature Granulocytes: 0.16 10*3/uL — ABNORMAL HIGH (ref 0.00–0.07)
Basophils Absolute: 0 10*3/uL (ref 0.0–0.1)
Basophils Relative: 0 %
Eosinophils Absolute: 0 10*3/uL (ref 0.0–0.5)
Eosinophils Relative: 0 %
HCT: 39.3 % (ref 39.0–52.0)
Hemoglobin: 13.1 g/dL (ref 13.0–17.0)
Immature Granulocytes: 1 %
Lymphocytes Relative: 9 %
Lymphs Abs: 1.6 10*3/uL (ref 0.7–4.0)
MCH: 27.6 pg (ref 26.0–34.0)
MCHC: 33.3 g/dL (ref 30.0–36.0)
MCV: 82.9 fL (ref 80.0–100.0)
Monocytes Absolute: 0.7 10*3/uL (ref 0.1–1.0)
Monocytes Relative: 4 %
Neutro Abs: 15.2 10*3/uL — ABNORMAL HIGH (ref 1.7–7.7)
Neutrophils Relative %: 86 %
Platelets: 355 10*3/uL (ref 150–400)
RBC: 4.74 MIL/uL (ref 4.22–5.81)
RDW: 13.8 % (ref 11.5–15.5)
WBC: 17.7 10*3/uL — ABNORMAL HIGH (ref 4.0–10.5)
nRBC: 0 % (ref 0.0–0.2)

## 2021-08-05 LAB — BASIC METABOLIC PANEL
Anion gap: 11 (ref 5–15)
BUN: 19 mg/dL (ref 8–23)
CO2: 22 mmol/L (ref 22–32)
Calcium: 9.1 mg/dL (ref 8.9–10.3)
Chloride: 97 mmol/L — ABNORMAL LOW (ref 98–111)
Creatinine, Ser: 1.46 mg/dL — ABNORMAL HIGH (ref 0.61–1.24)
GFR, Estimated: 50 mL/min — ABNORMAL LOW (ref >60)
Glucose, Bld: 215 mg/dL — ABNORMAL HIGH (ref 70–99)
Potassium: 3.8 mmol/L (ref 3.5–5.1)
Sodium: 130 mmol/L — ABNORMAL LOW (ref 135–145)

## 2021-08-05 LAB — LACTIC ACID, PLASMA: Lactic Acid, Venous: 1.6 mmol/L (ref 0.5–1.9)

## 2021-08-05 LAB — PROCALCITONIN: Procalcitonin: <0.1 ng/mL

## 2021-08-05 LAB — HEMOGLOBIN AND HEMATOCRIT, BLOOD
HCT: 32.1 % — ABNORMAL LOW (ref 39.0–52.0)
HCT: 36.6 % — ABNORMAL LOW (ref 39.0–52.0)
HCT: 30.2 % — ABNORMAL LOW (ref 39.0–52.0)
Hemoglobin: 10.5 g/dL — ABNORMAL LOW (ref 13.0–17.0)
Hemoglobin: 12.2 g/dL — ABNORMAL LOW (ref 13.0–17.0)
Hemoglobin: 10.1 g/dL — ABNORMAL LOW (ref 13.0–17.0)

## 2021-08-05 LAB — ABO/RH: ABO/RH(D): AB POS

## 2021-08-05 LAB — PREPARE RBC (CROSSMATCH)

## 2021-08-05 LAB — MRSA NEXT GEN BY PCR, NASAL: MRSA by PCR Next Gen: NOT DETECTED

## 2021-08-05 MED ORDER — ONDANSETRON HCL 4 MG/2ML IJ SOLN
4.0000 mg | Freq: Four times a day (QID) | INTRAMUSCULAR | Status: DC | PRN
  Administered 2021-08-09 (×2): 4 mg via INTRAVENOUS
  Filled 2021-08-05 (×3): qty 2

## 2021-08-05 MED ORDER — MAGNESIUM SULFATE 2 GM/50ML IV SOLN
2.0000 g | Freq: Once | INTRAVENOUS | Status: AC
  Administered 2021-08-05: 2 g via INTRAVENOUS
  Filled 2021-08-05: qty 50

## 2021-08-05 MED ORDER — AMIODARONE LOAD VIA INFUSION
150.0000 mg | Freq: Once | INTRAVENOUS | Status: AC
  Administered 2021-08-05: 150 mg via INTRAVENOUS
  Filled 2021-08-05: qty 83.34

## 2021-08-05 MED ORDER — POLYETHYLENE GLYCOL 3350 17 G PO PACK: 17.0000 g | PACK | Freq: Every day | ORAL | Status: DC | PRN

## 2021-08-05 MED ORDER — SODIUM CHLORIDE 0.9 % IV SOLN
1.0000 g | Freq: Every day | INTRAVENOUS | Status: DC
  Administered 2021-08-05: 1 g via INTRAVENOUS
  Filled 2021-08-05 (×2): qty 10

## 2021-08-05 MED ORDER — METOPROLOL TARTRATE 5 MG/5ML IV SOLN
INTRAVENOUS | Status: AC
  Administered 2021-08-05: 5 mg via INTRAVENOUS
  Filled 2021-08-05: qty 5

## 2021-08-05 MED ORDER — AMIODARONE HCL IN DEXTROSE 360-4.14 MG/200ML-% IV SOLN
30.0000 mg/h | INTRAVENOUS | Status: DC
Start: 2021-08-06 — End: 2021-08-09
  Administered 2021-08-06 – 2021-08-08 (×6): 30 mg/h via INTRAVENOUS
  Filled 2021-08-05 (×6): qty 200

## 2021-08-05 MED ORDER — SODIUM CHLORIDE 0.9 % IR SOLN
3000.0000 mL | Status: DC
  Administered 2021-08-05 – 2021-08-09 (×42): 3000 mL
  Administered 2021-08-10: 1500 mL
  Administered 2021-08-10 – 2021-08-12 (×24): 3000 mL

## 2021-08-05 MED ORDER — DOCUSATE SODIUM 100 MG PO CAPS: 100.0000 mg | ORAL_CAPSULE | Freq: Two times a day (BID) | ORAL | Status: DC | PRN

## 2021-08-05 MED ORDER — FENTANYL CITRATE PF 50 MCG/ML IJ SOSY
50.0000 ug | PREFILLED_SYRINGE | Freq: Once | INTRAMUSCULAR | Status: AC
  Administered 2021-08-05: 50 ug via INTRAVENOUS
  Filled 2021-08-05: qty 1

## 2021-08-05 MED ORDER — ORAL CARE MOUTH RINSE
15.0000 mL | Freq: Two times a day (BID) | OROMUCOSAL | Status: DC
  Administered 2021-08-05 – 2021-08-14 (×14): 15 mL via OROMUCOSAL

## 2021-08-05 MED ORDER — LACTATED RINGERS IV BOLUS
1000.0000 mL | Freq: Once | INTRAVENOUS | Status: AC
  Administered 2021-08-05: 1000 mL via INTRAVENOUS

## 2021-08-05 MED ORDER — OXYBUTYNIN CHLORIDE 5 MG PO TABS
5.0000 mg | ORAL_TABLET | Freq: Three times a day (TID) | ORAL | Status: DC
  Administered 2021-08-05 – 2021-08-06 (×4): 5 mg via ORAL
  Filled 2021-08-05 (×4): qty 1

## 2021-08-05 MED ORDER — AMIODARONE HCL IN DEXTROSE 360-4.14 MG/200ML-% IV SOLN
60.0000 mg/h | INTRAVENOUS | Status: AC
  Administered 2021-08-05 (×2): 60 mg/h via INTRAVENOUS
  Filled 2021-08-05 (×2): qty 200

## 2021-08-05 MED ORDER — METOPROLOL TARTRATE 5 MG/5ML IV SOLN: 5.0000 mg | Freq: Once | INTRAVENOUS | Status: AC

## 2021-08-05 MED ORDER — PROTHROMBIN COMPLEX CONC HUMAN 500 UNITS IV KIT
3795.0000 [IU] | PACK | Status: AC
  Administered 2021-08-05: 3795 [IU] via INTRAVENOUS
  Filled 2021-08-05: qty 552

## 2021-08-05 MED ORDER — FENTANYL CITRATE PF 50 MCG/ML IJ SOSY
50.0000 ug | PREFILLED_SYRINGE | INTRAMUSCULAR | Status: DC | PRN
  Administered 2021-08-05 – 2021-08-06 (×3): 50 ug via INTRAVENOUS
  Filled 2021-08-05 (×3): qty 1

## 2021-08-05 MED ORDER — ONDANSETRON HCL 4 MG/2ML IJ SOLN
4.0000 mg | Freq: Once | INTRAMUSCULAR | Status: AC
  Administered 2021-08-05: 4 mg via INTRAVENOUS
  Filled 2021-08-05: qty 2

## 2021-08-05 MED ORDER — ACETAMINOPHEN 325 MG PO TABS
650.0000 mg | ORAL_TABLET | Freq: Four times a day (QID) | ORAL | Status: DC | PRN
  Administered 2021-08-06 – 2021-08-11 (×9): 650 mg via ORAL
  Filled 2021-08-05 (×9): qty 2

## 2021-08-05 MED ORDER — CHLORHEXIDINE GLUCONATE CLOTH 2 % EX PADS
6.0000 | MEDICATED_PAD | Freq: Every day | CUTANEOUS | Status: DC
  Administered 2021-08-05 – 2021-08-14 (×10): 6 via TOPICAL

## 2021-08-05 MED ORDER — PHENYLEPHRINE HCL-NACL 20-0.9 MG/250ML-% IV SOLN
0.0000 ug/min | INTRAVENOUS | Status: DC
  Administered 2021-08-05: 20 ug/min via INTRAVENOUS
  Administered 2021-08-06: 110 ug/min via INTRAVENOUS
  Administered 2021-08-06: 40 ug/min via INTRAVENOUS
  Administered 2021-08-06: 100 ug/min via INTRAVENOUS
  Filled 2021-08-05 (×5): qty 250

## 2021-08-05 MED ORDER — POTASSIUM CHLORIDE 20 MEQ PO PACK
20.0000 meq | PACK | Freq: Once | ORAL | Status: AC
  Administered 2021-08-05: 20 meq via ORAL
  Filled 2021-08-05: qty 1

## 2021-08-05 MED ORDER — LACTATED RINGERS IV SOLN: INTRAVENOUS | Status: DC

## 2021-08-05 NOTE — Consult Note (Signed)
Urology Consult  ? ?History of Present Illness: Joshua Alvarez is a 76 y.o. who presented to the ED c/o hematuria and a non-draining catheter. ? ?Joshua Alvarez initially presented to the ED last night complaining of clots in his urine after attempting CIC.  He had an indwelling catheter removed 2 days ago and was taught CIC after that.  He returned to the ED this morning complaining of the catheter not draining and continued bleeding. ? ?Of note, WBC 17 in ED.  ? ?Of note Joshua Alvarez is on Eliquis.  He has a history of nephrolithiasis and a bladder stone s/p surgery in March. He is scheduled for a TURP in about 3 weeks. ? ?Past Medical History:  ?Diagnosis Date  ? Allergic rhinitis   ? Arthritis   ? Bladder spasms   ? Colon polyp   ? History of kidney stones   ? x 3   ? Hypertension   ? Migraine headache   ? Paroxysmal atrial fibrillation (HCC)   ? Diagnosed April 2023  ? Skin cancer   ? Urgency of urination   ? ? ?Past Surgical History:  ?Procedure Laterality Date  ? COLONOSCOPY    ? COLONOSCOPY N/A 02/11/2014  ? Procedure: COLONOSCOPY;  Surgeon: Rogene Houston, MD;  [facility];  Service: Endoscopy;  Laterality: N/A;  730  ? CYSTOSCOPY    ? x 2 - stone removal  ? FINGER SURGERY Right   ? right 5th finger  ? INGUINAL HERNIA REPAIR Bilateral   ? INSERTION OF MESH Left 05/04/2020  ? Procedure: INSERTION OF MESH;  Surgeon: Ralene Ok, MD;  Location: [facility];  Service: General;  Laterality: Left;  ? RIGHT KNEE REPAIRED    ? XI ROBOTIC ASSISTED INGUINAL HERNIA REPAIR WITH MESH Left 05/04/2020  ? Procedure: XI ROBOTIC ASSISTED LEFT INGUINAL HERNIA REPAIR WITH MESH;  Surgeon: Ralene Ok, MD;  Location: [facility];  Service: General;  Laterality: Left;  ? ? ?Current Hospital Medications: ? ?Home Meds:  ?No current facility-administered medications on file prior to encounter.  ? ?Current Outpatient Medications on File Prior to Encounter  ?Medication Sig Dispense Refill  ? apixaban (ELIQUIS) 5 MG TABS tablet Take 1  tablet (5 mg total) by mouth 2 (two) times daily. 60 tablet 3  ? atenolol (TENORMIN) 100 MG tablet Take 50 mg by mouth every morning.    ? enalapril (VASOTEC) 10 MG tablet Take 1 tablet (10 mg total) by mouth daily. (Patient taking differently: Take 10 mg by mouth every morning.) 90 tablet 3  ? finasteride (PROSCAR) 5 MG tablet Take 5 mg by mouth every morning.    ? hydrochlorothiazide (HYDRODIURIL) 25 MG tablet TAKE ONE-HALF TABLET (12.'5MG'$  TOTAL) BY MOUTH EVERY MORNING (Patient taking differently: Take 12.5 mg by mouth every morning.) 45 tablet 3  ? tamsulosin (FLOMAX) 0.4 MG CAPS capsule TAKE ONE CAPSULE (0.'4MG'$  TOTAL) BY MOUTH DAILY (Patient taking differently: 0.4 mg every morning.) 90 capsule 3  ? benzonatate (TESSALON) 200 MG capsule Take 1 capsule (200 mg total) by mouth 3 (three) times daily as needed for cough. (Patient not taking: Reported on 07/18/2021) 30 capsule 0  ? ezetimibe (ZETIA) 10 MG tablet Take 1 tablet (10 mg total) by mouth daily. (Patient not taking: Reported on 08/04/2021) 90 tablet 3  ? mirabegron ER (MYRBETRIQ) 25 MG TB24 tablet Take 1 tablet (25 mg total) by mouth daily. As needed for bladder spasm or dysuria (Patient not taking: Reported on 08/05/2021) 10 tablet  1  ? ? ? ?Scheduled Meds: ?Continuous Infusions: ?PRN Meds:. ? ?Allergies:  ?Allergies  ?Allergen Reactions  ? Statins Other (See Comments)  ?  Muscle aches joint pain unable to take any statins have tried several  ? Clarithromycin Swelling  ?  Swelling face and in mouth  ? ? ?Family History  ?Problem Relation Age of Onset  ? Cancer Mother   ?     Breast  ? Heart attack Father   ? ? ?Social History:  reports that he quit smoking about 11 years ago. His smoking use included cigarettes. He has never used smokeless tobacco. He reports that he does not currently use alcohol. He reports that he does not use drugs. ? ?ROS: ?A complete review of systems was performed.  All systems are negative except for pertinent findings as  noted. ? ?Physical Exam:  ?Vital signs in last 24 hours: ?Temp:  [97 ?F (36.1 ?C)-99.6 ?F (37.6 ?C)] 97.7 ?F (36.5 ?C) (04/28 1600) ?Pulse Rate:  [33-111] 33 (04/28 1600) ?Resp:  [14-27] 26 (04/28 1600) ?BP: (68-141)/(41-91) 80/56 (04/28 1600) ?SpO2:  [9 %-100 %] 97 % (04/28 1600) ?Weight:  [76.5 kg] 76.5 kg (04/28 0836) ?Constitutional:  Alert and oriented, No acute distress ?Cardiovascular: Regular rate and rhythm ?Respiratory: Normal respiratory effort, Lungs clear bilaterally ?GI: Abdomen is soft, nontender, nondistended, no abdominal masses ?GU: No CVA tenderness ?Neurologic: Grossly intact, no focal deficits ?Psychiatric: Normal mood and affect ?Foley in place draining merlot colored urine ? ?Laboratory Data:  ?Recent Labs  ?  08/05/21 ?0828 08/05/21 ?1107  ?WBC 17.7*  --   ?HGB 13.1 10.5*  ?HCT 39.3 32.1*  ?PLT 355  --   ? ? ?Recent Labs  ?  08/05/21 ?0828  ?NA 130*  ?K 3.8  ?CL 97*  ?GLUCOSE 215*  ?BUN 19  ?CALCIUM 9.1  ?CREATININE 1.46*  ? ? ? ?Results for orders placed or performed during the hospital encounter of 08/05/21 (from the past 24 hour(s))  ?CBC with Differential     Status: Abnormal  ? Collection Time: 08/05/21  8:28 AM  ?Result Value Ref Range  ? WBC 17.7 (H) 4.0 - 10.5 K/uL  ? RBC 4.74 4.22 - 5.81 MIL/uL  ? Hemoglobin 13.1 13.0 - 17.0 g/dL  ? HCT 39.3 39.0 - 52.0 %  ? MCV 82.9 80.0 - 100.0 fL  ? MCH 27.6 26.0 - 34.0 pg  ? MCHC 33.3 30.0 - 36.0 g/dL  ? RDW 13.8 11.5 - 15.5 %  ? Platelets 355 150 - 400 K/uL  ? nRBC 0.0 0.0 - 0.2 %  ? Neutrophils Relative % 86 %  ? Neutro Abs 15.2 (H) 1.7 - 7.7 K/uL  ? Lymphocytes Relative 9 %  ? Lymphs Abs 1.6 0.7 - 4.0 K/uL  ? Monocytes Relative 4 %  ? Monocytes Absolute 0.7 0.1 - 1.0 K/uL  ? Eosinophils Relative 0 %  ? Eosinophils Absolute 0.0 0.0 - 0.5 K/uL  ? Basophils Relative 0 %  ? Basophils Absolute 0.0 0.0 - 0.1 K/uL  ? Immature Granulocytes 1 %  ? Abs Immature Granulocytes 0.16 (H) 0.00 - 0.07 K/uL  ?Basic metabolic panel     Status: Abnormal  ?  Collection Time: 08/05/21  8:28 AM  ?Result Value Ref Range  ? Sodium 130 (L) 135 - 145 mmol/L  ? Potassium 3.8 3.5 - 5.1 mmol/L  ? Chloride 97 (L) 98 - 111 mmol/L  ? CO2 22 22 - 32 mmol/L  ? Glucose, Bld  215 (H) 70 - 99 mg/dL  ? BUN 19 8 - 23 mg/dL  ? Creatinine, Ser 1.46 (H) 0.61 - 1.24 mg/dL  ? Calcium 9.1 8.9 - 10.3 mg/dL  ? GFR, Estimated 50 (L) >60 mL/min  ? Anion gap 11 5 - 15  ?ABO/Rh     Status: None  ? Collection Time: 08/05/21  8:28 AM  ?Result Value Ref Range  ? ABO/RH(D)    ?  AB POS ?Performed at [facility], [place] [address], [place], [place] [address] ?  ?Type and screen [place]     Status: None (Preliminary result)  ? Collection Time: 08/05/21  9:15 AM  ?Result Value Ref Range  ? ABO/RH(D) AB POS   ? Antibody Screen NEG   ? Sample Expiration    ?  08/08/2021,2359 ?Performed at [facility], [place] [address], [place], [place] [address] ?  ? Unit Number [id number]   ? Blood Component Type RED CELLS,LR   ? Unit division 00   ? Status of Unit ISSUED   ? Transfusion Status OK TO TRANSFUSE   ? Crossmatch Result Compatible   ? Unit Number [id number]   ? Blood Component Type RED CELLS,LR   ? Unit division 00   ? Status of Unit ISSUED   ? Transfusion Status OK TO TRANSFUSE   ? Crossmatch Result Compatible   ? Unit Number [id number]   ? Blood Component Type RED CELLS,LR   ? Unit division 00   ? Status of Unit ALLOCATED   ? Transfusion Status OK TO TRANSFUSE   ? Crossmatch Result Compatible   ? Unit Number [id number]   ? Blood Component Type RED CELLS,LR   ? Unit division 00   ? Status of Unit ALLOCATED   ? Transfusion Status OK TO TRANSFUSE   ? Crossmatch Result Compatible   ?Hemoglobin and hematocrit, blood     Status: Abnormal  ? Collection Time: 08/05/21 11:07 AM  ?Result Value Ref Range  ? Hemoglobin 10.5 (L) 13.0 - 17.0 g/dL  ? HCT 32.1 (L) 39.0 - 52.0 %  ?Prepare RBC (crossmatch)     Status: None  ? Collection Time: 08/05/21 12:03 PM   ?Result Value Ref Range  ? Order Confirmation    ?  ORDER PROCESSED BY BLOOD BANK ?Performed at [facility], [place] [address] [place],  [address] ?  ? ?No results found for this or any previou

## 2021-08-05 NOTE — Progress Notes (Signed)
eLink Physician-Brief Progress Note ?Patient Name: Joshua Alvarez ?DOB: January 25, 1946 ?MRN: [id number] ? ? ?Date of Service ? 08/05/2021  ?HPI/Events of Note ? Persistent hypotension.  H/H 12.2/36.6.  afib on amio drip with improved rate control.  S/p 1 liter LR.  Systolic bp 83'J.  On abx for possible UTI.    ?eICU Interventions ? Another fluid bolus ?Consider Place cvc ?Start phenylephrine ?Follow hgb  ? ? ? ?Intervention Category ?Major Interventions: Hypotension - evaluation and management ? ?Mauri Brooklyn, P ?08/05/2021, 9:33 PM ?

## 2021-08-05 NOTE — ED Notes (Signed)
Pt was hypotensive and reported that he felt weak and faint.  Increased rate to 500cc/h to help with pt BP and increased to 927m/h after 136mutes.  Nursing at the bedside.  Family member remains at the bedside.  Pt changed into new hospital gown.   ?

## 2021-08-05 NOTE — Progress Notes (Signed)
Cross covering ICU physician.  ? ? ?Called to bedside for potential cvc placement. Pt BP is low with systolics in 10'O and lactate rising to 3.9 from normal earlier in day. He is on CBI for hematuria, hgb 12.  ? ?He is on antihypertension meds at home and did take them this morning. He is already on abx for ?uti.  ? ?Additionally pt is in afib, chronic but reportedly had rvr earlier this evening and is on amio gtt.  ? ?At this time hr is controlled in 90's. Warren Lacy has ordered neo gtt which is just getting started. I discussed the potential need for cvc with pt and his wife. They have reluctantly agreed but would like to try with peripheral iv for now. I have advised them that should he require escalating dose or remain on for days would likely need to have the cvc placed.  ? ?At this time we will cont with peripheral dose neo.  ?Cont to hold home anti-htn meds ?Start continuous fluids with normal echo earlier this month and rising lactate ?He is s/p 1L IVF at this time  ? ?RN has been advised to call elink should we need to escalate dose of neo past limit for piv and I will come and place cvc ?Consent to be signed now.  ? ?31 additional cc mins spent in care of this pt excluding procedures between 2200 and 2235 ?

## 2021-08-05 NOTE — ED Provider Notes (Signed)
?[facility] DEPT ?Provider Note ? ? ?CSN: [id number] ?Arrival date & time: 08/05/21  0759 ? ?  ? ?History ? ?Chief Complaint  ?Patient presents with  ? urinary catheter problem  ? ? ?Joshua Alvarez is a 76 y.o. male with a past medical history of atrial fibrillation on chronic anticoagulation with Eliquis, hypertension, prostatic hypertrophy, who presents with urinary obstruction.  Review of EMR shows that the patient came yesterday evening with the same complaint.  He was noted to have blood in his catheter and distention with 700 mL in the bladder.  The catheter was changed to a 20 [place] three-way, bladder irrigated with good return and resolution of symptoms.  Patient continued to have some bleeding but was sent home in good condition.  He states that his bleeding began to worsen around 3:00 in the morning and has been severe, bleeding around his catheter with inability to urinate through the catheter.  He has urgency to urinate with severe lower abdominal distention.  Patient is notably covered in blood and states that he feels extremely weak and lightheaded. ?HPI ? ?  ? ?Home Medications ?Prior to Admission medications   ?Medication Sig Start Date End Date Taking? Authorizing Provider  ?apixaban (ELIQUIS) 5 MG TABS tablet Take 1 tablet (5 mg total) by mouth 2 (two) times daily. 07/13/21 08/12/21 Yes Cook, Jayce G, DO  ?atenolol (TENORMIN) 100 MG tablet Take 50 mg by mouth every morning.   Yes [provider]  ?enalapril (VASOTEC) 10 MG tablet Take 1 tablet (10 mg total) by mouth daily. ?Patient taking differently: Take 10 mg by mouth every morning. 05/16/21  Yes Cook, Michael Boston G, DO  ?finasteride (PROSCAR) 5 MG tablet Take 5 mg by mouth every morning. 06/24/21  Yes [provider]  ?hydrochlorothiazide (HYDRODIURIL) 25 MG tablet TAKE ONE-HALF TABLET (12.'5MG'$  TOTAL) BY MOUTH EVERY MORNING ?Patient taking differently: Take 12.5 mg by mouth every morning. 05/16/21  Yes Cook,  Michael Boston G, DO  ?tamsulosin (FLOMAX) 0.4 MG CAPS capsule TAKE ONE CAPSULE (0.'4MG'$  TOTAL) BY MOUTH DAILY ?Patient taking differently: 0.4 mg every morning. 05/16/21  Yes Cook, Jayce G, DO  ?benzonatate (TESSALON) 200 MG capsule Take 1 capsule (200 mg total) by mouth 3 (three) times daily as needed for cough. ?Patient not taking: Reported on 07/18/2021 07/14/21   Coral Spikes, DO  ?ezetimibe (ZETIA) 10 MG tablet Take 1 tablet (10 mg total) by mouth daily. ?Patient not taking: Reported on 08/04/2021 05/31/21   Coral Spikes, DO  ?mirabegron ER (MYRBETRIQ) 25 MG TB24 tablet Take 1 tablet (25 mg total) by mouth daily. As needed for bladder spasm or dysuria ?Patient not taking: Reported on 08/05/2021 08/04/21   Daleen Bo, MD  ?   ? ?Allergies    ?Statins and Clarithromycin   ? ?Review of Systems   ?Review of Systems ? ?Physical Exam ?Updated Vital Signs ?BP (!) 80/56   Pulse (!) 33   Temp 97.7 ?F (36.5 ?C)   Resp (!) 26   Ht '5\' 7"'$  (1.702 m)   Wt 76.5 kg   SpO2 97%   BMI 26.41 kg/m?  ?Physical Exam ?Vitals and nursing note reviewed.  ?Constitutional:   ?   General: He is not in acute distress. ?   Appearance: He is well-developed. He is not diaphoretic.  ?HENT:  ?   Head: Normocephalic and atraumatic.  ?Eyes:  ?   General: No scleral icterus. ?   Conjunctiva/sclera: Conjunctivae normal.  ?Cardiovascular:  ?  Rate and Rhythm: Normal rate and regular rhythm.  ?   Heart sounds: Normal heart sounds.  ?Pulmonary:  ?   Effort: Pulmonary effort is normal. No respiratory distress.  ?   Breath sounds: Normal breath sounds.  ?Abdominal:  ?   General: There is distension.  ?   Palpations: Abdomen is soft.  ?   Tenderness: There is no abdominal tenderness.  ?   Comments: Palpable bladder distention up to the umbilicus.  ?Genitourinary: ?   Comments: Normal male genitalia, circumcised, urinary catheter in place, no urinary output ?Musculoskeletal:  ?   Cervical back: Normal range of motion and neck supple.  ?Skin: ?   General: Skin is  warm and dry.  ?Neurological:  ?   Mental Status: He is alert.  ?Psychiatric:     ?   Behavior: Behavior normal.  ? ? ?ED Results / Procedures / Treatments   ?Labs ?(all labs ordered are listed, but only abnormal results are displayed) ?Labs Reviewed  ?CBC WITH DIFFERENTIAL/PLATELET - Abnormal; Notable for the following components:  ?    Result Value  ? WBC 17.7 (*)   ? Neutro Abs 15.2 (*)   ? Abs Immature Granulocytes 0.16 (*)   ? All other components within normal limits  ?BASIC METABOLIC PANEL - Abnormal; Notable for the following components:  ? Sodium 130 (*)   ? Chloride 97 (*)   ? Glucose, Bld 215 (*)   ? Creatinine, Ser 1.46 (*)   ? GFR, Estimated 50 (*)   ? All other components within normal limits  ?HEMOGLOBIN AND HEMATOCRIT, BLOOD - Abnormal; Notable for the following components:  ? Hemoglobin 10.5 (*)   ? HCT 32.1 (*)   ? All other components within normal limits  ?URINE CULTURE  ?CULTURE, BLOOD (ROUTINE X 2)  ?CULTURE, BLOOD (ROUTINE X 2)  ?URINALYSIS, ROUTINE W REFLEX MICROSCOPIC  ?LACTIC ACID, PLASMA  ?LACTIC ACID, PLASMA  ?PROCALCITONIN  ?TYPE AND SCREEN  ?ABO/RH  ?PREPARE RBC (CROSSMATCH)  ? ? ?EKG ?None ? ?Radiology ?No results found. ? ?Procedures ?BLADDER CATHETERIZATION ? ?Date/Time: 08/05/2021 9:27 AM ?Performed by: Margarita Mail, PA-C ?Authorized by: Margarita Mail, PA-C  ? ?Consent:  ?  Consent obtained:  Verbal ?  Consent given by:  Patient ?  Risks discussed:  False passage, infection, urethral injury, incomplete procedure and pain ?  Alternatives discussed:  No treatment ?Universal protocol:  ?  Patient identity confirmed:  Arm band ?Pre-procedure details:  ?  Procedure purpose:  Therapeutic ?Anesthesia:  ?  Anesthesia method:  None ?Procedure details:  ?  Provider performed due to:  Complicated insertion ?  Catheter insertion:  Indwelling ?  Catheter type:  Triple lumen ?  Catheter size:  24 Fr ?  Bladder irrigation: yes   ?  Number of attempts:  1 ?  Urine characteristics:   Bloody ?Post-procedure details:  ?  Procedure completion:  Tolerated ?Comments:  ?   76 French catheter placed.  I attempted irrigation of the 56 [place] without any urinary return in the irrigation port.  There was a Bruhn amount of blood-tinged fluid in the exiting port however the patient had no significant urinary output. ?.Critical Care ?Performed by: Margarita Mail, PA-C ?Authorized by: Margarita Mail, PA-C  ? ?Critical care provider statement:  ?  Critical care time (minutes):  90 ?  Critical care was necessary to treat or prevent imminent or life-threatening deterioration of the following conditions:  Circulatory failure ?  Critical care was time  spent personally by me on the following activities:  Development of treatment plan with patient or surrogate, discussions with consultants, evaluation of patient's response to treatment, examination of patient, ordering and review of laboratory studies, ordering and review of radiographic studies, ordering and performing treatments and interventions, pulse oximetry, re-evaluation of patient's condition and review of old charts  ? ? ?Medications Ordered in ED ?Medications  ?docusate sodium (COLACE) capsule 100 mg (has no administration in time range)  ?polyethylene glycol (MIRALAX / GLYCOLAX) packet 17 g (has no administration in time range)  ?ondansetron (ZOFRAN) injection 4 mg (has no administration in time range)  ?cefTRIAXone (ROCEPHIN) 1 g in sodium chloride 0.9 % 100 mL IVPB (1 g Intravenous New Bag/Given 08/05/21 1608)  ?acetaminophen (TYLENOL) tablet 650 mg (has no administration in time range)  ?fentaNYL (SUBLIMAZE) injection 50 mcg (50 mcg Intravenous Given 08/05/21 0838)  ?ondansetron [facility]) injection 4 mg (4 mg Intravenous Given 08/05/21 0838)  ?lactated ringers bolus 1,000 mL (0 mLs Intravenous Stopped 08/05/21 1256)  ?prothrombin complex conc human (KCENTRA) IVPB 3,795 Units (0 Units Intravenous Stopped 08/05/21 1515)  ? ? ?ED Course/ Medical Decision  Making/ A&P ?Clinical Course as of 08/05/21 1642  ?Fri Aug 05, 2021  ?0900 After attempting to irrigate the 20 French Foley catheter with 500 and personally changed the Foley catheter to a 24 [place] three-way catheter.  Ther

## 2021-08-05 NOTE — ED Notes (Signed)
Pt is resting, he states that he feels better at this time. BP still low.  EDP has been in to check on pt and PA checked in frequently and has just updated pt and family with pt permission.  Pt given warm blankets for comfort.  RN remains at bedside 1:1 for monitoring of pt.  Second unit of blood completed.  PA aware  ?

## 2021-08-05 NOTE — ED Notes (Signed)
Critical care NP at the bedside.  Family remains at the bedside.  At this time urine appears cool aid red without blood clots (while continuous bladder irrigation is ongoing but at a slightly lower rate than earlier) ?

## 2021-08-05 NOTE — ED Triage Notes (Signed)
Pt reports blood around catheter, was seen last night for urinary retention. Pt c/o pain in pelvic area and penis. Pt on blood thinners ?

## 2021-08-05 NOTE — ED Notes (Signed)
Continuous bladder irrigation continues.  Pt continues to have bloody urine with clots from foley.   ?

## 2021-08-05 NOTE — H&P (Addendum)
? ?NAME:  Joshua Alvarez, MRN:  [id number], DOB:  1946/01/05, LOS: 0 ?ADMISSION DATE:  08/05/2021, CONSULTATION DATE:  08/05/2021 ?REFERRING MD:  Dr. Regenia Skeeter , CHIEF COMPLAINT:  Hypotension   ? ?History of Present Illness:  ?Joshua Alvarez is a 76 y.o. male with a PMH significant for bladder spasms, prostatic hypertrophy, kidney stones, hypertension, paroxysmal atrial fibrillation anticoagulated at baseline with Eliquis, skin cancer, and arthritis presenting with a long ED with complaints of urinary obstruction.  Patient was seen at this facility night prior with exchange of Foley catheter due to same complaint.  Patient's Foley catheter was exchanged in ED with good return of urine seen and patient was discharged from emergency department. ? ?When the obstructed symptoms returned this a.m. patient presented to ED once again for further assistance.  Patient also noticed bleeding around catheter this a.m. ? ?On ED arrival patient was seen hemodynamically stable with borderline low blood pressure 105/79.  Lab work significant for sodium 130, glucose 215, creatinine 1.46, GFR 50, WBC 17.7.  Patient continued to have significant bleeding from urethra with inability to place Foley catheter.  Urology consulted and replaced the catheter successfully.  Repeat H&H 3 hours later revealed hemoglobin dropped to 10.5 this prompted with persistent hypotension prompting PCCM consult. ? ?Pertinent  Medical History  ?Bladder spasms, prostatic hypertrophy, kidney stones, hypertension, paroxysmal atrial fibrillation anticoagulated at baseline with Eliquis, skin cancer, and arthritis ? ?Significant Hospital Events: ?Including procedures, antibiotic start and stop dates in addition to other pertinent events   ?4/8 returned to ED for continued complaints of urinary obstruction likely from urethra.  Became hypotensive while in the emergency department requiring transfusion of PRBC ? ?Interim History / Subjective:  ?As above ? ?Objective    ?Blood pressure (!) 77/62, pulse 97, temperature (!) 97.4 ?F (36.3 ?C), resp. rate (!) 21, height '5\' 7"'$  (1.702 m), weight 76.5 kg, SpO2 (!) 9 %. ?   ?   ? ?Intake/Output Summary (Last 24 hours) at 08/05/2021 1354 ?Last data filed at 08/05/2021 1351 ?Gross per 24 hour  ?Intake 3315 ml  ?Output 2800 ml  ?Net 515 ml  ? ?Filed Weights  ? 08/05/21 0836  ?Weight: 76.5 kg  ? ? ?Examination: ?General: Very pleasant acute on chronically ill appearing elderly  male lying in bed, in NAD ?HEENT: [place]/AT, MM pink/moist, PERRL,  ?Neuro: Alert and oriented x3, non-focal  ?CV: s1s2 regular rate and rhythm, no murmur, rubs, or gallops,  ?PULM:  Clear to ascultation no increased work, no added breath sounds  ?GI: soft, bowel sounds active in all 4 quadrants, non-tender, non-distended, tolerating TF ?Extremities: warm/dry, no edema  ?Skin: no rashes or lesions ? ?Resolved Hospital Problem list   ? ? ?Assessment & Plan:  ?Hematuria with nondraining Foley catheter ?History of BPH with urinary retention  ?-Indwelling cath was removed 4/26 and patient has struggled with retention since. Patient and family reported that light bleeding began after self cath attempt afternoon of 4/27 which prompted call to urology and and ED visit was encouraged  ?-Scheduled for TURP in 3 weeks ?P: ?Urology evaluated and replaced indwelling cath  ?Bladder irrigation per urology  ?Continue foley  ? ?Hypotension  ?-Likely driven by acute blood loss in the setting of multiple chronic antihypertensive agents but can not rule out sepsis at this time. Lactic and procal currently pending  ?P: ?Admit ICU ?Pan cultures prior to antibiotic ?IV antibiotics vanc and Zosyn usually ?Aggressive IV hydration provided in ED  ?Empiric Ceftriaxone  ?  MAP goal < 65  ?Trend lactic acid ?Obtain Procalcitonin ?Monitor urine output ? ?Acute blood loss anemia  ?-Hgb dropped 2 grams while in the ED  ?P: ?Eliquis was reversed with Kcentra in ED  ?Trend cbc  ?Monitor hemodynamics  ?Hold or  antihypertensives and anticoagulation  ? ?Paroxysmal A-fib anticoagulated at baseline  ?Hx of HTN/HLD ?-Home medications inlcude Eliquis, Enalapril, HCTZ, Atenolol, and zetia  ?P: ?Continuous telemetry  ?Hold Eliquis and oral hypertensive  ? ?Best Practice (right click and "Reselect all SmartList Selections" daily)  ? ?Diet/type: Regular consistency (see orders) ?DVT prophylaxis: SCD ?GI prophylaxis: PPI ?Lines: N/A ?Foley:  Yes, and it is still needed ?Code Status:  full code ?Last date of multidisciplinary goals of care discussion: Patient and family updated at bedside  ? ?Labs   ?CBC: ?Recent Labs  ?Lab 08/05/21 ?0828 08/05/21 ?1107  ?WBC 17.7*  --   ?NEUTROABS 15.2*  --   ?HGB 13.1 10.5*  ?HCT 39.3 32.1*  ?MCV 82.9  --   ?PLT 355  --   ? ? ?Basic Metabolic Panel: ?Recent Labs  ?Lab 08/05/21 ?0828  ?NA 130*  ?K 3.8  ?CL 97*  ?CO2 22  ?GLUCOSE 215*  ?BUN 19  ?CREATININE 1.46*  ?CALCIUM 9.1  ? ?GFR: ?Estimated Creatinine Clearance: 40.9 mL/min (A) (by C-G formula based on SCr of 1.46 mg/dL (H)). ?Recent Labs  ?Lab 08/05/21 ?0828  ?WBC 17.7*  ? ? ?Liver Function Tests: ?No results for input(s): AST, ALT, ALKPHOS, BILITOT, PROT, ALBUMIN in the last 168 hours. ?No results for input(s): LIPASE, AMYLASE in the last 168 hours. ?No results for input(s): AMMONIA in the last 168 hours. ? ?ABG ?No results found for: PHART, PCO2ART, PO2ART, HCO3, TCO2, ACIDBASEDEF, O2SAT  ? ?Coagulation Profile: ?No results for input(s): INR, PROTIME in the last 168 hours. ? ?Cardiac Enzymes: ?No results for input(s): CKTOTAL, CKMB, CKMBINDEX, TROPONINI in the last 168 hours. ? ?HbA1C: ?Hgb A1c MFr Bld  ?Date/Time Value Ref Range Status  ?05/30/2021 08:26 AM 5.9 (H) 4.8 - 5.6 % Final  ?  Comment:  ?           Prediabetes: 5.7 - 6.4 ?         Diabetes: >6.4 ?         Glycemic control for adults with diabetes: <7.0 ?  ? ? ?CBG: ?No results for input(s): GLUCAP in the last 168 hours. ? ?Review of Systems:   ?Please see the history of present  illness. All other systems reviewed and are negative  ? ?Past Medical History:  ?He,  has a past medical history of Allergic rhinitis, Arthritis, Bladder spasms, Colon polyp, History of kidney stones, Hypertension, Migraine headache, Paroxysmal atrial fibrillation ([place]), Skin cancer, and Urgency of urination.  ? ?Surgical History:  ? ?Past Surgical History:  ?Procedure Laterality Date  ? COLONOSCOPY    ? COLONOSCOPY N/A 02/11/2014  ? Procedure: COLONOSCOPY;  Surgeon: Rogene Houston, MD;  [facility];  Service: Endoscopy;  Laterality: N/A;  730  ? CYSTOSCOPY    ? x 2 - stone removal  ? FINGER SURGERY Right   ? right 5th finger  ? INGUINAL HERNIA REPAIR Bilateral   ? INSERTION OF MESH Left 05/04/2020  ? Procedure: INSERTION OF MESH;  Surgeon: Ralene Ok, MD;  Location: [facility];  Service: General;  Laterality: Left;  ? RIGHT KNEE REPAIRED    ? XI ROBOTIC ASSISTED INGUINAL HERNIA REPAIR WITH MESH Left 05/04/2020  ? Procedure: XI  ROBOTIC ASSISTED LEFT INGUINAL HERNIA REPAIR WITH MESH;  Surgeon: Ralene Ok, MD;  Location: [facility];  Service: General;  Laterality: Left;  ?  ? ?Social History:  ? reports that he quit smoking about 11 years ago. His smoking use included cigarettes. He has never used smokeless tobacco. He reports that he does not currently use alcohol. He reports that he does not use drugs.  ? ?Family History:  ?His family history includes Cancer in his mother; Heart attack in his father.  ? ?Allergies ?Allergies  ?Allergen Reactions  ? Statins Other (See Comments)  ?  Muscle aches joint pain unable to take any statins have tried several  ? Clarithromycin Swelling  ?  Swelling face and in mouth  ?  ? ?Home Medications  ?Prior to Admission medications   ?Medication Sig Start Date End Date Taking? Authorizing Provider  ?apixaban (ELIQUIS) 5 MG TABS tablet Take 1 tablet (5 mg total) by mouth 2 (two) times daily. 07/13/21 08/12/21 Yes Cook, Jayce G, DO  ?atenolol (TENORMIN) 100 MG tablet Take 50 mg  by mouth every morning.   Yes [provider]  ?enalapril (VASOTEC) 10 MG tablet Take 1 tablet (10 mg total) by mouth daily. ?Patient taking differently: Take 10 mg by mouth every morning. 2/6/2

## 2021-08-06 ENCOUNTER — Inpatient Hospital Stay (HOSPITAL_COMMUNITY): Payer: Medicare HMO

## 2021-08-06 DIAGNOSIS — D62 Acute posthemorrhagic anemia: Secondary | ICD-10-CM | POA: Diagnosis not present

## 2021-08-06 LAB — BLOOD CULTURE ID PANEL (REFLEXED) - BCID2

## 2021-08-06 LAB — BASIC METABOLIC PANEL
Anion gap: 5 (ref 5–15)
BUN: 19 mg/dL (ref 8–23)
CO2: 25 mmol/L (ref 22–32)
Calcium: 8.2 mg/dL — ABNORMAL LOW (ref 8.9–10.3)
Chloride: 108 mmol/L (ref 98–111)
Creatinine, Ser: 1.25 mg/dL — ABNORMAL HIGH (ref 0.61–1.24)
GFR, Estimated: >60 mL/min (ref >60)
Glucose, Bld: 127 mg/dL — ABNORMAL HIGH (ref 70–99)
Potassium: 4.3 mmol/L (ref 3.5–5.1)
Sodium: 138 mmol/L (ref 135–145)

## 2021-08-06 LAB — PHOSPHORUS: Phosphorus: 2.5 mg/dL (ref 2.5–4.6)

## 2021-08-06 LAB — CBC
HCT: 29 % — ABNORMAL LOW (ref 39.0–52.0)
HCT: 27.6 % — ABNORMAL LOW (ref 39.0–52.0)
Hemoglobin: 9.4 g/dL — ABNORMAL LOW (ref 13.0–17.0)
Hemoglobin: 8.9 g/dL — ABNORMAL LOW (ref 13.0–17.0)
MCH: 26.8 pg (ref 26.0–34.0)
MCH: 27.1 pg (ref 26.0–34.0)
MCHC: 32.4 g/dL (ref 30.0–36.0)
MCHC: 32.2 g/dL (ref 30.0–36.0)
MCV: 82.6 fL (ref 80.0–100.0)
MCV: 83.9 fL (ref 80.0–100.0)
Platelets: 217 K/uL (ref 150–400)
Platelets: 192 10*3/uL (ref 150–400)
RBC: 3.51 MIL/uL — ABNORMAL LOW (ref 4.22–5.81)
RBC: 3.29 MIL/uL — ABNORMAL LOW (ref 4.22–5.81)
RDW: 14.9 % (ref 11.5–15.5)
RDW: 15.1 % (ref 11.5–15.5)
WBC: 29.2 K/uL — ABNORMAL HIGH (ref 4.0–10.5)
WBC: 20.4 10*3/uL — ABNORMAL HIGH (ref 4.0–10.5)
nRBC: 0 % (ref 0.0–0.2)
nRBC: 0 % (ref 0.0–0.2)

## 2021-08-06 LAB — PROCALCITONIN: Procalcitonin: 8.74 ng/mL

## 2021-08-06 LAB — LACTIC ACID, PLASMA
Lactic Acid, Venous: 2.8 mmol/L (ref 0.5–1.9)
Lactic Acid, Venous: 3.3 mmol/L (ref 0.5–1.9)
Lactic Acid, Venous: 3.9 mmol/L (ref 0.5–1.9)

## 2021-08-06 LAB — MAGNESIUM: Magnesium: 2.1 mg/dL (ref 1.7–2.4)

## 2021-08-06 LAB — PREPARE RBC (CROSSMATCH)

## 2021-08-06 LAB — HEMOGLOBIN AND HEMATOCRIT, BLOOD
HCT: 30.5 % — ABNORMAL LOW (ref 39.0–52.0)
Hemoglobin: 10 g/dL — ABNORMAL LOW (ref 13.0–17.0)

## 2021-08-06 MED ORDER — OXYBUTYNIN CHLORIDE 5 MG PO TABS
5.0000 mg | ORAL_TABLET | Freq: Four times a day (QID) | ORAL | Status: DC
Start: 2021-08-06 — End: 2021-08-14
  Administered 2021-08-06 – 2021-08-14 (×30): 5 mg via ORAL
  Filled 2021-08-06 (×30): qty 1

## 2021-08-06 MED ORDER — SODIUM CHLORIDE 0.9 % IV SOLN
2.0000 g | Freq: Every day | INTRAVENOUS | Status: DC
  Administered 2021-08-06 – 2021-08-09 (×4): 2 g via INTRAVENOUS
  Filled 2021-08-06 (×4): qty 20

## 2021-08-06 MED ORDER — POLYETHYLENE GLYCOL 3350 17 G PO PACK
17.0000 g | PACK | Freq: Every day | ORAL | Status: DC
  Administered 2021-08-06 – 2021-08-11 (×6): 17 g via ORAL
  Filled 2021-08-06 (×7): qty 1

## 2021-08-06 MED ORDER — SODIUM CHLORIDE 0.9% IV SOLUTION: Freq: Once | INTRAVENOUS | Status: DC

## 2021-08-06 MED ORDER — LACTATED RINGERS IV BOLUS
1000.0000 mL | Freq: Once | INTRAVENOUS | Status: AC
  Administered 2021-08-06: 1000 mL via INTRAVENOUS

## 2021-08-06 NOTE — Progress Notes (Signed)
eLink Physician-Brief Progress Note ?Patient Name: Joshua Alvarez ?DOB: Mar 09, 1946 ?MRN: [id number] ? ? ?Date of Service ? 08/06/2021  ?HPI/Events of Note ? Bp remains low and most recent hgb 10 down from 12.  He is on phenylephrine drip.  Nurse feels CBI more bloody.  Discussed with urology.  ?eICU Interventions ? Bolus LR ?Repeat hgb now ?Continue to titrate pressor  ? ? ? ?Intervention Category ?Major Interventions: Hypotension - evaluation and management ? ?Mauri Brooklyn, P ?08/06/2021, 12:21 AM ?

## 2021-08-06 NOTE — Progress Notes (Signed)
PHARMACY - PHYSICIAN COMMUNICATION ?CRITICAL VALUE ALERT - BLOOD CULTURE IDENTIFICATION (BCID) ? ?Joshua Alvarez is an 76 y.o. male who presented to [facility] on 08/05/2021 with a chief complaint of bleeding and started on Rocephin for UTI ? ?Assessment:  4/4 Bcx bottles with Ecoli ? ?Name of physician (or Provider) Contacted: [place] ? ?Current antibiotics: Rocephin 1g daily ? ?Changes to prescribed antibiotics recommended:  ?Increase Rocephin to 2g daily ? ?Results for orders placed or performed during the hospital encounter of 08/05/21  ?Blood Culture ID Panel (Reflexed) (Collected: 08/05/2021  4:10 PM)  ?Result Value Ref Range  ? Enterococcus faecalis NOT DETECTED NOT DETECTED  ? Enterococcus Faecium NOT DETECTED NOT DETECTED  ? Listeria monocytogenes NOT DETECTED NOT DETECTED  ? Staphylococcus species NOT DETECTED NOT DETECTED  ? Staphylococcus aureus (BCID) NOT DETECTED NOT DETECTED  ? Staphylococcus epidermidis NOT DETECTED NOT DETECTED  ? Staphylococcus lugdunensis NOT DETECTED NOT DETECTED  ? Streptococcus species NOT DETECTED NOT DETECTED  ? Streptococcus agalactiae NOT DETECTED NOT DETECTED  ? Streptococcus pneumoniae NOT DETECTED NOT DETECTED  ? Streptococcus pyogenes NOT DETECTED NOT DETECTED  ? A.calcoaceticus-baumannii NOT DETECTED NOT DETECTED  ? Bacteroides fragilis NOT DETECTED NOT DETECTED  ? Enterobacterales DETECTED (A) NOT DETECTED  ? Enterobacter cloacae complex NOT DETECTED NOT DETECTED  ? Escherichia coli DETECTED (A) NOT DETECTED  ? Klebsiella aerogenes NOT DETECTED NOT DETECTED  ? Klebsiella oxytoca NOT DETECTED NOT DETECTED  ? Klebsiella pneumoniae NOT DETECTED NOT DETECTED  ? Proteus species NOT DETECTED NOT DETECTED  ? Salmonella species NOT DETECTED NOT DETECTED  ? Serratia marcescens NOT DETECTED NOT DETECTED  ? Haemophilus influenzae NOT DETECTED NOT DETECTED  ? Neisseria meningitidis NOT DETECTED NOT DETECTED  ? Pseudomonas aeruginosa NOT DETECTED NOT DETECTED  ? Stenotrophomonas  maltophilia NOT DETECTED NOT DETECTED  ? Candida albicans NOT DETECTED NOT DETECTED  ? Candida auris NOT DETECTED NOT DETECTED  ? Candida glabrata NOT DETECTED NOT DETECTED  ? Candida krusei NOT DETECTED NOT DETECTED  ? Candida parapsilosis NOT DETECTED NOT DETECTED  ? Candida tropicalis NOT DETECTED NOT DETECTED  ? Cryptococcus neoformans/gattii NOT DETECTED NOT DETECTED  ? CTX-M ESBL NOT DETECTED NOT DETECTED  ? Carbapenem resistance IMP NOT DETECTED NOT DETECTED  ? Carbapenem resistance KPC NOT DETECTED NOT DETECTED  ? Carbapenem resistance NDM NOT DETECTED NOT DETECTED  ? Carbapenem resist OXA 48 LIKE NOT DETECTED NOT DETECTED  ? Carbapenem resistance VIM NOT DETECTED NOT DETECTED  ? ? ?Kara Mead ?08/06/2021  9:04 AM ? ?

## 2021-08-06 NOTE — Progress Notes (Signed)
? ?NAME:  Joshua Alvarez, MRN:  [id number], DOB:  [birth year], LOS: 1 ?ADMISSION DATE:  08/05/2021, CONSULTATION DATE:  08/05/2021 ?REFERRING MD:  Dr. Regenia Skeeter , CHIEF COMPLAINT:  Hypotension   ? ?History of Present Illness:  ?Joshua Alvarez is a 76 y.o. male with a PMH significant for bladder spasms, prostatic hypertrophy, kidney stones, hypertension, paroxysmal atrial fibrillation anticoagulated at baseline with Eliquis, skin cancer, and arthritis presenting with a long ED with complaints of urinary obstruction.  Patient was seen at this facility night prior with exchange of Foley catheter due to same complaint.  Patient's Foley catheter was exchanged in ED with good return of urine seen and patient was discharged from emergency department. ? ?When the obstructed symptoms returned this a.m. patient presented to ED once again for further assistance.  Patient also noticed bleeding around catheter this a.m. ? ?On ED arrival patient was seen hemodynamically stable with borderline low blood pressure 105/79.  Lab work significant for sodium 130, glucose 215, creatinine 1.46, GFR 50, WBC 17.7.  Patient continued to have significant bleeding from urethra with inability to place Foley catheter.  Urology consulted and replaced the catheter successfully.  Repeat H&H 3 hours later revealed hemoglobin dropped to 10.5 this prompted with persistent hypotension prompting PCCM consult. ? ?Pertinent  Medical History  ?Bladder spasms, prostatic hypertrophy, kidney stones, hypertension, paroxysmal atrial fibrillation anticoagulated at baseline with Eliquis, skin cancer, and arthritis ? ?Significant Hospital Events: ?Including procedures, antibiotic start and stop dates in addition to other pertinent events   ?4/8 returned to ED for continued complaints of urinary obstruction likely from urethra.  Became hypotensive while in the emergency department requiring transfusion of PRBC ? ?Interim History / Subjective:  ? ?Patient started on  phenylephrine overnight for hypotension. ? ?Patient continues to have bladder spasms this morning. ? ?Objective   ?Blood pressure 116/76, pulse 89, temperature 99 ?F (37.2 ?C), temperature source Oral, resp. rate 16, height '5\' 7"'$  (1.702 m), weight 78.7 kg, SpO2 98 %. ?   ?   ? ?Intake/Output Summary (Last 24 hours) at 08/06/2021 1050 ?Last data filed at 08/06/2021 1038 ?Gross per 24 hour  ?Intake 35961.89 ml  ?Output 33085 ml  ?Net 2876.89 ml  ? ?Filed Weights  ? 08/05/21 0836 08/05/21 1700 08/06/21 0500  ?Weight: 76.5 kg 79.8 kg 78.7 kg  ? ? ?Examination: ?General: elderly male, no acute distress, resting in bed ?HEENT: Joshua Alvarez/AT, moist mucous membranes, sclera anicteric ?Neuro: A&O x 3, moving all extremities ?CV: irregularly irregular, s1s2, no murmurs ?PULM: clear to auscultation bilaterally. No wheezing ?GI: soft, non-tender, non-distended, BS+ ?Extremities: warm, no edema ?Skin: no rashes  ?GU: foley in place, CBI running, pink tinged urine ? ?Resolved Hospital Problem list   ? ? ?Assessment & Plan:  ?Hemorrhagic and Septic Shock ?E.Coli Bacteremia ?Acute Blood Loss Anemia from Traumatic Foley Placement ?Lactic Acidosis ?- holding anticoagulation ?- Trend H/H ?- Continue phenylephrine for MAP > 65 ?- Treating bacteremia with ceftriaxone ?- Transfuse for hemoglobin < 7g/dL ? ?Hematuria with nondraining Foley catheter ?History of BPH with urinary retention  ?- Urology following ?- Continue CBI ?- Continue foley ? ?Acute Kidney Injury ?In setting of sepsis and shock ?- Monitor renal function ? ?Atrial Fibrillation with RVR ?Paroxysmal A-fib anticoagulated at baseline  ?Hx of HTN/HLD ?- continue amiodarone for rate control ?- Continuous telemetry  ?- Hold Eliquis and oral anti-hypertensives ? ?Bladder Spasms ?- Continue oxybutynin '5mg'$  TID ?- PRN fentanyl IV ? ?Best Practice (right click and "Reselect  all SmartList Selections" daily)  ? ?Diet/type: Regular consistency (see orders) ?DVT prophylaxis: SCD ?GI prophylaxis:  PPI ?Lines: N/A ?Foley:  Yes, and it is still needed ?Code Status:  full code ?Last date of multidisciplinary goals of care discussion: Patient and family updated at bedside  ? ?Labs   ?CBC: ?Recent Labs  ?Lab 08/05/21 ?0828 08/05/21 ?1107 08/05/21 ?1825 08/05/21 ?2217 08/06/21 ?8127 08/06/21 ?5170  ?WBC 17.7*  --   --   --   --  29.2*  ?NEUTROABS 15.2*  --   --   --   --   --   ?HGB 13.1 10.5* 12.2* 10.1* 10.0* 9.4*  ?HCT 39.3 32.1* 36.6* 30.2* 30.5* 29.0*  ?MCV 82.9  --   --   --   --  82.6  ?PLT 355  --   --   --   --  217  ? ? ?Basic Metabolic Panel: ?Recent Labs  ?Lab 08/05/21 ?0174 08/06/21 ?9449  ?NA 130* 138  ?K 3.8 4.3  ?CL 97* 108  ?CO2 22 25  ?GLUCOSE 215* 127*  ?BUN 19 19  ?CREATININE 1.46* 1.25*  ?CALCIUM 9.1 8.2*  ?MG  --  2.1  ?PHOS  --  2.5  ? ?GFR: ?Estimated Creatinine Clearance: 47.7 mL/min (A) (by C-G formula based on SCr of 1.25 mg/dL (H)). ?Recent Labs  ?Lab 08/05/21 ?0828 08/05/21 ?1610 08/05/21 ?1825 08/06/21 ?0002 08/06/21 ?6759 08/06/21 ?1638  ?PROCALCITON  --  <0.10  --   --   --  8.74  ?WBC 17.7*  --   --   --   --  29.2*  ?LATICACIDVEN  --  1.6 3.9* 3.3* 2.8*  --   ? ? ?Liver Function Tests: ?No results for input(s): AST, ALT, ALKPHOS, BILITOT, PROT, ALBUMIN in the last 168 hours. ?No results for input(s): LIPASE, AMYLASE in the last 168 hours. ?No results for input(s): AMMONIA in the last 168 hours. ? ?ABG ?No results found for: PHART, PCO2ART, PO2ART, HCO3, TCO2, ACIDBASEDEF, O2SAT  ? ?Coagulation Profile: ?No results for input(s): INR, PROTIME in the last 168 hours. ? ?Cardiac Enzymes: ?No results for input(s): CKTOTAL, CKMB, CKMBINDEX, TROPONINI in the last 168 hours. ? ?HbA1C: ?Hgb A1c MFr Bld  ?Date/Time Value Ref Range Status  ?05/30/2021 08:26 AM 5.9 (H) 4.8 - 5.6 % Final  ?  Comment:  ?           Prediabetes: 5.7 - 6.4 ?         Diabetes: >6.4 ?         Glycemic control for adults with diabetes: <7.0 ?  ? ? ?CBG: ?No results for input(s): GLUCAP in the last 168  hours. ? ? ?Critical care time: 40 min  ? ?Joshua Jackson, MD ?[facility]monary [facility] ?Office: [phone] ? ? ?See Amion for personal pager ?PCCM on call pager [phone] until 7pm. ?Please call Elink 7p-7a. [phone] ? ? ? ? ? ? ?

## 2021-08-06 NOTE — Progress Notes (Signed)
?  Subjective: ?Pressors started yesterday. Mr Joshua Alvarez had some confusion overnight but is doing better this AM. Also on Amio for A fib ? ?Blood counts stable, white count increased from yesterday ? ?Objective: ?Vital signs in last 24 hours: ?Temp:  [97 ?F (36.1 ?C)-99.6 ?F (37.6 ?C)] 98 ?F (36.7 ?C) (04/29 0400) ?Pulse Rate:  [33-149] 88 (04/29 0645) ?Resp:  [14-41] 24 (04/29 0645) ?BP: (66-136)/(33-96) 106/55 (04/29 0645) ?SpO2:  [9 %-100 %] 98 % (04/29 0645) ?Weight:  [76.5 kg-79.8 kg] 78.7 kg (04/29 0500) ? ?Intake/Output from previous day: ?04/28 0701 - 04/29 0700 ?In: 29270.7 [I.V.:1560.8; Blood:945; IV Piggyback:3364.9] ?Out: 27035 [Urine:27035] ?Intake/Output this shift: ?Total I/O ?In: 22670.9 [I.V.:1560.8; Other:18000; IV Piggyback:3110.1] ?Out: 16109 [UEAVW:09811] ? ?Physical Exam:  ?General: Alert and oriented ?CV: RRR ?Lungs: Clear ?Abdomen: Soft, ND, ATTP ?Ext: NT, No erythema ? ?Foley draining pink on low/med CBI ? ?Lab Results: ?Recent Labs  ?  08/05/21 ?1825 08/05/21 ?2217 08/06/21 ?9147  ?HGB 12.2* 10.1* 10.0*  ?HCT 36.6* 30.2* 30.5*  ? ?BMET ?Recent Labs  ?  08/05/21 ?0828  ?NA 130*  ?K 3.8  ?CL 97*  ?CO2 22  ?GLUCOSE 215*  ?BUN 19  ?CREATININE 1.46*  ?CALCIUM 9.1  ? ? ? ?Studies/Results: ?No results found. ? ?Assessment/Plan: ?Joshua Alvarez is with gross hematuria, on CBI. Admitted to ICU for hypotension, on pressors for BP support.  ? ?-I placed his foley on traction this AM ?-titrate CBI to light pink ?-will continue to follow ? ? LOS: 1 day  ? ?Donald Pore MD ?08/06/2021, 6:51 AM ?[facility]  ?Pager: [phone] ? ? ? ?

## 2021-08-06 NOTE — Progress Notes (Signed)
Patient is now less confused with BPs more stable. Urine is noting to become more bloody, which attendings have been made aware of. When CBI is slowed or stop, urine output is frank, BRB from the catheter and there is bloody drainage from his penis around the catheter. Micah Flesher, MD; and Ruthann Cancer, DO aware.  ?

## 2021-08-07 DIAGNOSIS — D62 Acute posthemorrhagic anemia: Secondary | ICD-10-CM | POA: Diagnosis not present

## 2021-08-07 LAB — CBC
HCT: 24.7 % — ABNORMAL LOW (ref 39.0–52.0)
HCT: 24.3 % — ABNORMAL LOW (ref 39.0–52.0)
HCT: 26.8 % — ABNORMAL LOW (ref 39.0–52.0)
Hemoglobin: 8 g/dL — ABNORMAL LOW (ref 13.0–17.0)
Hemoglobin: 7.9 g/dL — ABNORMAL LOW (ref 13.0–17.0)
Hemoglobin: 8.6 g/dL — ABNORMAL LOW (ref 13.0–17.0)
MCH: 26.7 pg (ref 26.0–34.0)
MCH: 27 pg (ref 26.0–34.0)
MCH: 27.2 pg (ref 26.0–34.0)
MCHC: 32.4 g/dL (ref 30.0–36.0)
MCHC: 32.5 g/dL (ref 30.0–36.0)
MCHC: 32.1 g/dL (ref 30.0–36.0)
MCV: 82.3 fL (ref 80.0–100.0)
MCV: 82.9 fL (ref 80.0–100.0)
MCV: 84.8 fL (ref 80.0–100.0)
Platelets: 163 10*3/uL (ref 150–400)
Platelets: 168 10*3/uL (ref 150–400)
Platelets: 170 10*3/uL (ref 150–400)
RBC: 3 MIL/uL — ABNORMAL LOW (ref 4.22–5.81)
RBC: 2.93 MIL/uL — ABNORMAL LOW (ref 4.22–5.81)
RBC: 3.16 MIL/uL — ABNORMAL LOW (ref 4.22–5.81)
RDW: 15 % (ref 11.5–15.5)
RDW: 15.1 % (ref 11.5–15.5)
RDW: 15.1 % (ref 11.5–15.5)
WBC: 15.5 10*3/uL — ABNORMAL HIGH (ref 4.0–10.5)
WBC: 15 10*3/uL — ABNORMAL HIGH (ref 4.0–10.5)
WBC: 14.3 10*3/uL — ABNORMAL HIGH (ref 4.0–10.5)
nRBC: 0 % (ref 0.0–0.2)
nRBC: 0 % (ref 0.0–0.2)
nRBC: 0 % (ref 0.0–0.2)

## 2021-08-07 LAB — BASIC METABOLIC PANEL
Anion gap: 4 — ABNORMAL LOW (ref 5–15)
BUN: 13 mg/dL (ref 8–23)
CO2: 24 mmol/L (ref 22–32)
Calcium: 7.7 mg/dL — ABNORMAL LOW (ref 8.9–10.3)
Chloride: 109 mmol/L (ref 98–111)
Creatinine, Ser: 1 mg/dL (ref 0.61–1.24)
GFR, Estimated: >60 mL/min (ref >60)
Glucose, Bld: 112 mg/dL — ABNORMAL HIGH (ref 70–99)
Potassium: 3.4 mmol/L — ABNORMAL LOW (ref 3.5–5.1)
Sodium: 137 mmol/L (ref 135–145)

## 2021-08-07 LAB — MAGNESIUM: Magnesium: 1.8 mg/dL (ref 1.7–2.4)

## 2021-08-07 LAB — PROCALCITONIN: Procalcitonin: 5.38 ng/mL

## 2021-08-07 MED ORDER — POTASSIUM CHLORIDE CRYS ER 20 MEQ PO TBCR
40.0000 meq | EXTENDED_RELEASE_TABLET | Freq: Once | ORAL | Status: AC
  Administered 2021-08-07: 40 meq via ORAL
  Filled 2021-08-07: qty 2

## 2021-08-07 MED ORDER — MAGNESIUM SULFATE 2 GM/50ML IV SOLN
2.0000 g | Freq: Once | INTRAVENOUS | Status: AC
  Administered 2021-08-07: 2 g via INTRAVENOUS
  Filled 2021-08-07: qty 50

## 2021-08-07 NOTE — Progress Notes (Signed)
?  Subjective: ?No acute events overnight. Pressors stopped yesterday ? ?Blood cultures positive yesterday for E coli.  ? ?WBC decreased today after increase in rocephin dose yesterday. Hgb drifted down to 7.9 this AM.  ? ?Objective: ?Vital signs in last 24 hours: ?Temp:  [97.5 ?F (36.4 ?C)-99.8 ?F (37.7 ?C)] 98.2 ?F (36.8 ?C) (04/30 0740) ?Pulse Rate:  [61-116] 90 (04/30 0830) ?Resp:  [16-34] 30 (04/30 0830) ?BP: (74-138)/(45-94) 127/94 (04/30 0830) ?SpO2:  [84 %-100 %] 98 % (04/30 0830) ?Weight:  [80.9 kg] 80.9 kg (04/30 0500) ? ?Intake/Output from previous day: ?04/29 0701 - 04/30 0700 ?In: 45384.4 [I.V.:3234.4; IV Piggyback:150] ?Out: 48185 [UDJSH:70263] ?Intake/Output this shift: ?Total I/O ?In: -  ?Out: 1400 [Urine:1400] ? ?Physical Exam:  ?General: Alert and oriented ?CV: RRR ?Lungs: Clear ?Abdomen: Soft, ND, ATTP ?Ext: NT, No erythema ? ?Foley draining clear/pink on low rate CBI. I held the cbi while I was in the room and it pinked up slightly ? ?Lab Results: ?Recent Labs  ?  08/06/21 ?2035 08/07/21 ?0243 08/07/21 ?7858  ?HGB 8.9* 8.0* 7.9*  ?HCT 27.6* 24.7* 24.3*  ? ?BMET ?Recent Labs  ?  08/06/21 ?0655 08/07/21 ?0243  ?NA 138 137  ?K 4.3 3.4*  ?CL 108 109  ?CO2 25 24  ?GLUCOSE 127* 112*  ?BUN 19 13  ?CREATININE 1.25* 1.00  ?CALCIUM 8.2* 7.7*  ? ? ? ?Studies/Results: ?DG CHEST PORT 1 VIEW ? ?Result Date: 08/06/2021 ?CLINICAL DATA:  Cough EXAM: PORTABLE CHEST 1 VIEW COMPARISON:  07/13/2021 FINDINGS: Cardiac shadow is enlarged. Thoracic aorta is tortuous but stable. Mild central vascular congestion is noted new from the prior exam. No focal infiltrate or effusion is seen. No bony abnormality is noted. IMPRESSION: Mild vascular congestion. Electronically Signed   By: Inez Catalina M.D.   On: 08/06/2021 23:46   ? ?Assessment/Plan: ?Joshua Alvarez is with gross hematuria, on CBI. Clinically doing better. Blood cultures positive for e coli. ? ?-urine improved today ?-susceptibilities pending for bcx ?-continue CBI  for now, could try holding it tomorrow AM  ? ? LOS: 2 days  ? ?Donald Pore MD ?08/07/2021, 8:56 AM ?[facility]  ?Pager: [phone] ? ? ? ?

## 2021-08-07 NOTE — Progress Notes (Addendum)
Patient's vital signs noted to be increased. RR now between 27-33, HR increased despite amio drip, seen upwards of 120s. Ruthann Cancer, DO made aware. CXR completed. CBI continues. Will continue monitoring patient's vital signs for changes. Elink also aware.  ?

## 2021-08-07 NOTE — Progress Notes (Signed)
? ?NAME:  Joshua Alvarez, MRN:  [id number], DOB:  1946/03/05, LOS: 2 ?ADMISSION DATE:  08/05/2021, CONSULTATION DATE:  08/05/2021 ?REFERRING MD:  Dr. Regenia Skeeter , CHIEF COMPLAINT:  Hypotension   ? ?History of Present Illness:  ?Joshua Alvarez is a 76 y.o. male with a PMH significant for bladder spasms, prostatic hypertrophy, kidney stones, hypertension, paroxysmal atrial fibrillation anticoagulated at baseline with Eliquis, skin cancer, and arthritis presenting with a long ED with complaints of urinary obstruction.  Patient was seen at this facility night prior with exchange of Foley catheter due to same complaint.  Patient's Foley catheter was exchanged in ED with good return of urine seen and patient was discharged from emergency department. ? ?When the obstructed symptoms returned this a.m. patient presented to ED once again for further assistance.  Patient also noticed bleeding around catheter this a.m. ? ?On ED arrival patient was seen hemodynamically stable with borderline low blood pressure 105/79.  Lab work significant for sodium 130, glucose 215, creatinine 1.46, GFR 50, WBC 17.7.  Patient continued to have significant bleeding from urethra with inability to place Foley catheter.  Urology consulted and replaced the catheter successfully.  Repeat H&H 3 hours later revealed hemoglobin dropped to 10.5 this prompted with persistent hypotension prompting PCCM consult. ? ?Pertinent  Medical History  ?Bladder spasms, prostatic hypertrophy, kidney stones, hypertension, paroxysmal atrial fibrillation anticoagulated at baseline with Eliquis, skin cancer, and arthritis ? ?Significant Hospital Events: ?Including procedures, antibiotic start and stop dates in addition to other pertinent events   ?4/8 returned to ED for continued complaints of urinary obstruction likely from urethra.  Became hypotensive while in the emergency department requiring transfusion of PRBC ? ?Interim History / Subjective:  ? ?No acute events  overnight.  ? ?Patient weaned off neo over night.  ? ?Objective   ?Blood pressure (!) 90/54, pulse 79, temperature 98.5 ?F (36.9 ?C), temperature source Oral, resp. rate (!) 21, height '5\' 7"'$  (1.702 m), weight 80.9 kg, SpO2 94 %. ?   ?   ? ?Intake/Output Summary (Last 24 hours) at 08/07/2021 0733 ?Last data filed at 08/07/2021 7412 ?Gross per 24 hour  ?Intake 45384.35 ml  ?Output 44410 ml  ?Net 974.35 ml  ? ?Filed Weights  ? 08/05/21 1700 08/06/21 0500 08/07/21 0500  ?Weight: 79.8 kg 78.7 kg 80.9 kg  ? ? ?Examination: ?General: elderly male, no acute distress, resting in bed ?HEENT: [place]/AT, moist mucous membranes, sclera anicteric ?Neuro: A&O x 3, moving all extremities ?CV: irregularly irregular, s1s2, no murmurs ?PULM: clear to auscultation bilaterally. No wheezing ?GI: soft, non-tender, non-distended, BS+ ?Extremities: warm, no edema ?Skin: no rashes  ?GU: foley in place, CBI running, pink tinged urine ? ?Resolved Hospital Problem list   ? ? ?Assessment & Plan:  ?Hemorrhagic and Septic Shock ?E.Coli Bacteremia ?Acute Blood Loss Anemia from Traumatic Foley Placement ?Lactic Acidosis ?- holding anticoagulation ?- Trend H/H ?- Continue phenylephrine for MAP > 65 ?- Treating bacteremia with ceftriaxone ?- Transfuse for hemoglobin < 7g/dL ? ?Hematuria with nondraining Foley catheter ?History of BPH with urinary retention  ?- Urology following ?- Continue CBI ?- Continue foley ? ?Acute Kidney Injury - Improving ?In setting of sepsis and shock ?- Monitor renal function ? ?Atrial Fibrillation with RVR ?Paroxysmal A-fib anticoagulated at baseline  ?Hx of HTN/HLD ?- continue amiodarone for rate control ?- Continuous telemetry  ?- Hold Eliquis and oral anti-hypertensives ? ?Bladder Spasms ?- Continue oxybutynin '5mg'$  TID ?- PRN fentanyl IV ? ?Best Practice (right click and "  Reselect all SmartList Selections" daily)  ? ?Diet/type: Regular consistency (see orders) ?DVT prophylaxis: SCD ?GI prophylaxis: PPI ?Lines: N/A ?Foley:  Yes,  and it is still needed ?Code Status:  full code ?Last date of multidisciplinary goals of care discussion: Patient and family updated at bedside  ? ?Labs   ?CBC: ?Recent Labs  ?Lab 08/05/21 ?9480 08/05/21 ?1107 08/06/21 ?1655 08/06/21 ?3748 08/06/21 ?2035 08/07/21 ?0243 08/07/21 ?2707  ?WBC 17.7*  --   --  29.2* 20.4* 15.5* 15.0*  ?NEUTROABS 15.2*  --   --   --   --   --   --   ?HGB 13.1   < > 10.0* 9.4* 8.9* 8.0* 7.9*  ?HCT 39.3   < > 30.5* 29.0* 27.6* 24.7* 24.3*  ?MCV 82.9  --   --  82.6 83.9 82.3 82.9  ?PLT 355  --   --  217 192 163 168  ? < > = values in this interval not displayed.  ? ? ?Basic Metabolic Panel: ?Recent Labs  ?Lab 08/05/21 ?8675 08/06/21 ?4492 08/07/21 ?0243  ?NA 130* 138 137  ?K 3.8 4.3 3.4*  ?CL 97* 108 109  ?CO2 '22 25 24  '$ ?GLUCOSE 215* 127* 112*  ?BUN '19 19 13  '$ ?CREATININE 1.46* 1.25* 1.00  ?CALCIUM 9.1 8.2* 7.7*  ?MG  --  2.1 1.8  ?PHOS  --  2.5  --   ? ?GFR: ?Estimated Creatinine Clearance: 65 mL/min (by C-G formula based on SCr of 1 mg/dL). ?Recent Labs  ?Lab 08/05/21 ?1610 08/05/21 ?1825 08/06/21 ?0002 08/06/21 ?0100 08/06/21 ?7121 08/06/21 ?2035 08/07/21 ?0243 08/07/21 ?9758  ?PROCALCITON <0.10  --   --   --  8.74  --  5.38  --   ?WBC  --   --   --   --  29.2* 20.4* 15.5* 15.0*  ?LATICACIDVEN 1.6 3.9* 3.3* 2.8*  --   --   --   --   ? ? ?Liver Function Tests: ?No results for input(s): AST, ALT, ALKPHOS, BILITOT, PROT, ALBUMIN in the last 168 hours. ?No results for input(s): LIPASE, AMYLASE in the last 168 hours. ?No results for input(s): AMMONIA in the last 168 hours. ? ?ABG ?No results found for: PHART, PCO2ART, PO2ART, HCO3, TCO2, ACIDBASEDEF, O2SAT  ? ?Coagulation Profile: ?No results for input(s): INR, PROTIME in the last 168 hours. ? ?Cardiac Enzymes: ?No results for input(s): CKTOTAL, CKMB, CKMBINDEX, TROPONINI in the last 168 hours. ? ?HbA1C: ?Hgb A1c MFr Bld  ?Date/Time Value Ref Range Status  ?05/30/2021 08:26 AM 5.9 (H) 4.8 - 5.6 % Final  ?  Comment:  ?           Prediabetes: 5.7  - 6.4 ?         Diabetes: >6.4 ?         Glycemic control for adults with diabetes: <7.0 ?  ? ? ?CBG: ?No results for input(s): GLUCAP in the last 168 hours. ? ? ?Critical care time: 30 min  ? ?Freda Jackson, MD ?[facility] ?Office: [phone] ? ? ?See Amion for personal pager ?PCCM on call pager [phone] until 7pm. ?Please call Elink 7p-7a. [phone] ? ? ? ? ? ? ?

## 2021-08-07 NOTE — Progress Notes (Signed)
[facility] ADULT ICU REPLACEMENT PROTOCOL ? ? ?The patient does apply for the [facility] Adult ICU Electrolyte Replacment Protocol based on the criteria listed below:  ? ?1.Exclusion criteria: TCTS patients, ECMO patients, and Dialysis patients ?2. Is GFR >/= 30 ml/min? Yes.    ?Patient's GFR today is >60 ?3. Is SCr </= 2? Yes.   ?Patient's SCr is 1.00 mg/dL ?4. Did SCr increase >/= 0.5 in 24 hours? No. ?5.Pt's weight >40kg  Yes.   ?6. Abnormal electrolyte(s): K+ 3.4. <ag 1.8  ?7. Electrolytes replaced per protocol ?8.  Call MD STAT for K+ </= 2.5, Phos </= 1, or Mag </= 1 ?Physician:  Dr. Mauri [place] ? ?Joshua Alvarez 08/07/2021 5:04 AM  ?

## 2021-08-08 DIAGNOSIS — D62 Acute posthemorrhagic anemia: Secondary | ICD-10-CM | POA: Diagnosis not present

## 2021-08-08 LAB — BASIC METABOLIC PANEL
Anion gap: 7 (ref 5–15)
BUN: 7 mg/dL — ABNORMAL LOW (ref 8–23)
CO2: 26 mmol/L (ref 22–32)
Calcium: 7.9 mg/dL — ABNORMAL LOW (ref 8.9–10.3)
Chloride: 105 mmol/L (ref 98–111)
Creatinine, Ser: 0.94 mg/dL (ref 0.61–1.24)
GFR, Estimated: >60 mL/min (ref >60)
Glucose, Bld: 112 mg/dL — ABNORMAL HIGH (ref 70–99)
Potassium: 3.6 mmol/L (ref 3.5–5.1)
Sodium: 138 mmol/L (ref 135–145)

## 2021-08-08 LAB — CBC
HCT: 23.8 % — ABNORMAL LOW (ref 39.0–52.0)
Hemoglobin: 7.8 g/dL — ABNORMAL LOW (ref 13.0–17.0)
MCH: 27.4 pg (ref 26.0–34.0)
MCHC: 32.8 g/dL (ref 30.0–36.0)
MCV: 83.5 fL (ref 80.0–100.0)
Platelets: 174 10*3/uL (ref 150–400)
RBC: 2.85 MIL/uL — ABNORMAL LOW (ref 4.22–5.81)
RDW: 15.2 % (ref 11.5–15.5)
WBC: 11.8 10*3/uL — ABNORMAL HIGH (ref 4.0–10.5)
nRBC: 0 % (ref 0.0–0.2)

## 2021-08-08 LAB — CULTURE, BLOOD (ROUTINE X 2): Special Requests: ADEQUATE

## 2021-08-08 LAB — PREPARE RBC (CROSSMATCH)

## 2021-08-08 MED ORDER — MENTHOL 3 MG MT LOZG
1.0000 | LOZENGE | OROMUCOSAL | Status: DC | PRN
  Administered 2021-08-08: 3 mg via ORAL
  Filled 2021-08-08: qty 9

## 2021-08-08 MED ORDER — SODIUM CHLORIDE 0.9% IV SOLUTION: Freq: Once | INTRAVENOUS | Status: AC

## 2021-08-08 NOTE — Progress Notes (Signed)
? ?PROGRESS NOTE ? ?Joshua Alvarez OVZ:[id number] DOB: December 01, 1945 DOA: 08/05/2021 ?PCP: Coral Spikes, DO ? ?HPI/Recap of past 24 hours: ?Joshua Alvarez is a 76 y.o. male with a PMH significant for bladder spasms, prostatic hypertrophy, kidney stones, hypertension, paroxysmal atrial fibrillation on Eliquis, skin cancer, and arthritis presented at [facility] ED with complaints of urinary obstruction.  Patient was seen at this facility night prior with exchange of Foley catheter due to same complaint.  Patient's Foley catheter was exchanged in ED with good return of urine seen and patient was discharged from emergency department.  When the obstructed symptoms returned, patient presented to ED once again for further assistance.  Patient had significant bleeding from urethra with inability to place Foley catheter.  Urology consulted and placed the catheter successfully.  His blood pressure dropped requiring vasopressors.  He was admitted to the ICU due to hemorrhagic and septic shock.  His blood culture returned positive for E. coli.  Currently on Rocephin. ? ?Care transferred to [facility] on 08/08/2021. ? ?08/08/2021: Patient was seen and examined at his bedside while in the stepdown unit.  His wife was present at bedside.  He denies any abdominal pain or nausea at the time of the visit.  Continues to have hematuria.  Hemoglobin dropped to 7.8, will transfuse 1 unit PRBC.  Consent for blood transfusion was obtained from the patient and his wife at bedside. ? ?Assessment/Plan: ?Principal Problem: ?  Acute blood loss anemia ? ?Hemorrhagic and Septic Shock secondary to acute blood loss anemia from traumatic Foley placement and E. coli bacteremia, respectively. ?Treat underlying conditions-off vasopressors-holding off anticoagulation. ?Hemoglobin dropped to 7.8 this morning, transfuse 1 unit PRBCs due to ongoing hematuria. ?Continue Rocephin 2 g daily for E. coli bacteremia. ?Maintain MAP greater than 65 ?Monitor fever curve and  WBC. ? ?Lactic Acidosis in the setting of hemorrhagic/septic shock. ?Presenting lactic acid 3.9, down trended. ?Off vasopressors ?Maintain MAP greater than 65 ?Maintain hemoglobin greater than 8 in the setting of ongoing bleeding ?  ?Hematuria with nondraining Foley catheter ?History of BPH with urinary retention  ?- Urology following ?- Continue CBI ?- Continue foley ?  ?Resolved acute Kidney Injury -  ?In setting of sepsis and shock ?- Monitor renal function ?Renal function is back to baseline. ?  ?Atrial Fibrillation with RVR ?Paroxysmal A-fib anticoagulated at baseline  ?Hx of HTN/HLD ?Still in A-fib with RVR. ?Continue amiodarone for rate control.  ?Holding off Eliquis and oral antihypertensives. ?Continue to monitor on telemetry ?  ?Bladder Spasms ?- Continue oxybutynin '5mg'$  TID ?- PRN fentanyl IV ?  ?Critical care time: 65 minutes. ? ? ? ? ?Code Status: Full code ? ?Family Communication: His wife at bedside. ? ?Disposition Plan: Likely will discharge to home once neurology signs of ? ? ?Consultants: ?Urology ?PCCM ? ?Procedures: ?Foley catheter placement and bladder irrigation ? ?Antimicrobials: ?Rocephin ? ?DVT prophylaxis: ?SCDs ? ?Status is: Inpatient ?Patient requires at least 2 midnights for further evaluation and treatment of present condition. ? ? ? ?Objective: ?Vitals:  ? 08/08/21 1100 08/08/21 1122 08/08/21 1200 08/08/21 1219  ?BP: (!) 153/98  (!) 138/115   ?Pulse: (!) 115  (!) 136 (!) 115  ?Resp: (!) 23  (!) 33 (!) 27  ?Temp:  (!) 97.4 ?F (36.3 ?C)  97.9 ?F (36.6 ?C)  ?TempSrc:  Axillary  Oral  ?SpO2: 98%  97% (!) 85%  ?Weight:      ?Height:      ? ? ?Intake/Output Summary (Last  24 hours) at 08/08/2021 1239 ?Last data filed at 08/08/2021 1227 ?Gross per 24 hour  ?Intake 17281.14 ml  ?Output 27955 ml  ?Net -10673.86 ml  ? ?Filed Weights  ? 08/06/21 0500 08/07/21 0500 08/08/21 0500  ?Weight: 78.7 kg 80.9 kg 81 kg  ? ? ?Exam: ? ?General: 76 y.o. year-old male well developed well nourished in no acute  distress.  Alert and oriented x3. ?Cardiovascular: Regular rate and rhythm with no rubs or gallops.  No thyromegaly or JVD noted.   ?Respiratory: Clear to auscultation with no wheezes or rales. Good inspiratory effort. ?Abdomen: Soft nontender nondistended with normal bowel sounds x4 quadrants. ?Musculoskeletal: No lower extremity edema. 2/4 pulses in all 4 extremities. ?Skin: No ulcerative lesions noted or rashes, ?Psychiatry: Mood is appropriate for condition and setting ? ? ?Data Reviewed: ?CBC: ?Recent Labs  ?Lab 08/05/21 ?0828 08/05/21 ?1107 08/06/21 ?2035 08/07/21 ?0243 08/07/21 ?2706 08/07/21 ?1603 08/08/21 ?0250  ?WBC 17.7*   < > 20.4* 15.5* 15.0* 14.3* 11.8*  ?NEUTROABS 15.2*  --   --   --   --   --   --   ?HGB 13.1   < > 8.9* 8.0* 7.9* 8.6* 7.8*  ?HCT 39.3   < > 27.6* 24.7* 24.3* 26.8* 23.8*  ?MCV 82.9   < > 83.9 82.3 82.9 84.8 83.5  ?PLT 355   < > 192 163 168 170 174  ? < > = values in this interval not displayed.  ? ?Basic Metabolic Panel: ?Recent Labs  ?Lab 08/05/21 ?2376 08/06/21 ?2831 08/07/21 ?0243 08/08/21 ?0250  ?NA 130* 138 137 138  ?K 3.8 4.3 3.4* 3.6  ?CL 97* 108 109 105  ?CO2 '22 25 24 26  '$ ?GLUCOSE 215* 127* 112* 112*  ?BUN '19 19 13 '$ 7*  ?CREATININE 1.46* 1.25* 1.00 0.94  ?CALCIUM 9.1 8.2* 7.7* 7.9*  ?MG  --  2.1 1.8  --   ?PHOS  --  2.5  --   --   ? ?GFR: ?Estimated Creatinine Clearance: 69.2 mL/min (by C-G formula based on SCr of 0.94 mg/dL). ?Liver Function Tests: ?No results for input(s): AST, ALT, ALKPHOS, BILITOT, PROT, ALBUMIN in the last 168 hours. ?No results for input(s): LIPASE, AMYLASE in the last 168 hours. ?No results for input(s): AMMONIA in the last 168 hours. ?Coagulation Profile: ?No results for input(s): INR, PROTIME in the last 168 hours. ?Cardiac Enzymes: ?No results for input(s): CKTOTAL, CKMB, CKMBINDEX, TROPONINI in the last 168 hours. ?BNP (last 3 results) ?No results for input(s): PROBNP in the last 8760 hours. ?HbA1C: ?No results for input(s): HGBA1C in the last 72  hours. ?CBG: ?No results for input(s): GLUCAP in the last 168 hours. ?Lipid Profile: ?No results for input(s): CHOL, HDL, LDLCALC, TRIG, CHOLHDL, LDLDIRECT in the last 72 hours. ?Thyroid Function Tests: ?No results for input(s): TSH, T4TOTAL, FREET4, T3FREE, THYROIDAB in the last 72 hours. ?Anemia Panel: ?No results for input(s): VITAMINB12, FOLATE, FERRITIN, TIBC, IRON, RETICCTPCT in the last 72 hours. ?Urine analysis: ?No results found for: COLORURINE, APPEARANCEUR, [place], [place], [place], [place], [place], KETONESUR, PROTEINUR, [place], NITRITE, LEUKOCYTESUR ?Sepsis Labs: ?'@LABRCNTIP'$ (procalcitonin:4,lacticidven:4) ? ?) ?Recent Results (from the past 240 hour(s))  ?Culture, blood (Routine X 2) w Reflex to ID Panel     Status: Abnormal  ? Collection Time: 08/05/21  4:10 PM  ? Specimen: BLOOD  ?Result Value Ref Range Status  ? Specimen Description   Final  ?  BLOOD ?Performed at [facility], [place] [address], [place], [place] [address] ?  ?  Special Requests   Final  ?  BOTTLES DRAWN AEROBIC AND ANAEROBIC Blood Culture results may not be optimal due to an inadequate volume of blood received in culture bottles ?Performed at [facility], [place] [address], [place], [place] [address] ?  ? Culture  Setup Time   Final  ?  GRAM NEGATIVE RODS ?IN BOTH AEROBIC AND ANAEROBIC BOTTLES ?CRITICAL RESULT CALLED TO, READ BACK BY AND VERIFIED WITH: PHARMD C SHADE [id number] AT 838 AM BY CM ?Performed at [facility], [place] [address], [place], [place] [address] ?  ? Culture ESCHERICHIA COLI (A)  Final  ? Report Status 08/08/2021 FINAL  Final  ? Organism ID, Bacteria ESCHERICHIA COLI  Final  ?    Susceptibility  ? Escherichia coli - MIC*  ?  AMPICILLIN >=32 RESISTANT Resistant   ?  CEFAZOLIN <=4 SENSITIVE Sensitive   ?  CEFEPIME <=0.12 SENSITIVE Sensitive   ?  CEFTAZIDIME <=1 SENSITIVE Sensitive   ?  CEFTRIAXONE <=0.25 SENSITIVE Sensitive   ?  CIPROFLOXACIN 1 RESISTANT Resistant   ?   GENTAMICIN >=16 RESISTANT Resistant   ?  IMIPENEM <=0.25 SENSITIVE Sensitive   ?  TRIMETH/SULFA >=320 RESISTANT Resistant   ?  AMPICILLIN/SULBACTAM >=32 RESISTANT Resistant   ?  PIP/TAZO <=4 SENSITIVE Sensitive   ?

## 2021-08-08 NOTE — Progress Notes (Signed)
?  Subjective: ?No acute events overnight. Amio gtt for rate control started.  Had several bladder spasms, catheter irrigated and only a few Dhanani clots removed.  CBI on slow to med gtt rate. ?  ? ?Objective: ?Vital signs in last 24 hours: ?Temp:  [97.2 ?F (36.2 ?C)-98.3 ?F (36.8 ?C)] 97.9 ?F (36.6 ?C) (05/01 1219) ?Pulse Rate:  [82-136] 115 (05/01 1219) ?Resp:  [21-33] 27 (05/01 1219) ?BP: (120-153)/(72-115) 138/115 (05/01 1200) ?SpO2:  [85 %-99 %] 85 % (05/01 1219) ?Weight:  [81 kg] 81 kg (05/01 0500) ? ?Intake/Output from previous day: ?04/30 0701 - 05/01 0700 ?In: 20024 [I.V.:1924; IV Piggyback:100] ?Out: [phone] [Urine:24655] ?Intake/Output this shift: ?Total I/O ?In: 598.8 [I.V.:498.8; IV Piggyback:100] ?Out: 9350 [UEAVW:0981] ? ?Physical Exam:  ?General: Alert and oriented ?CV: tachycardia/irregular ?Lungs: Clear ?Abdomen: Soft, ND, ATTP ?Ext: NT, No erythema ? ?Foley draining clear/pink on low rate CBI.   ? ?Lab Results: ?Recent Labs  ?  08/07/21 ?1914 08/07/21 ?1603 08/08/21 ?0250  ?HGB 7.9* 8.6* 7.8*  ?HCT 24.3* 26.8* 23.8*  ? ?BMET ?Recent Labs  ?  08/07/21 ?0243 08/08/21 ?0250  ?NA 137 138  ?K 3.4* 3.6  ?CL 109 105  ?CO2 24 26  ?GLUCOSE 112* 112*  ?BUN 13 7*  ?CREATININE 1.00 0.94  ?CALCIUM 7.7* 7.9*  ? ? ? ?Studies/Results: ?DG CHEST PORT 1 VIEW ? ?Result Date: 08/06/2021 ?CLINICAL DATA:  Cough EXAM: PORTABLE CHEST 1 VIEW COMPARISON:  07/13/2021 FINDINGS: Cardiac shadow is enlarged. Thoracic aorta is tortuous but stable. Mild central vascular congestion is noted new from the prior exam. No focal infiltrate or effusion is seen. No bony abnormality is noted. IMPRESSION: Mild vascular congestion. Electronically Signed   By: Inez Catalina M.D.   On: 08/06/2021 23:46   ? ?Assessment/Plan: ?Joshua Alvarez is with gross hematuria from urosepsis, on CBI. Unable to stop CBI at this time. ? ?Continue CBI at a slow gtt rate for now, wean to pink, stop if able. ? ?Will continue to follow, but he will be keeping his  catheter in the short term until we can get to his TURP.  This is scheduled currently on 5/18.  If he needs cardiac clearance, would like to have cardiology see him.  He will need to be off anticoagulants for his surgery in the peri-op phase. ? ?Will check in tomorrow. ? ? LOS: 3 days  ? ?[name] ?08/08/2021, 12:33 PM ?[facility]  ? ? ? ?

## 2021-08-09 ENCOUNTER — Inpatient Hospital Stay (HOSPITAL_COMMUNITY): Payer: Medicare HMO

## 2021-08-09 DIAGNOSIS — I480 Paroxysmal atrial fibrillation: Secondary | ICD-10-CM

## 2021-08-09 DIAGNOSIS — I10 Essential (primary) hypertension: Secondary | ICD-10-CM | POA: Diagnosis not present

## 2021-08-09 DIAGNOSIS — D62 Acute posthemorrhagic anemia: Secondary | ICD-10-CM | POA: Diagnosis not present

## 2021-08-09 LAB — TYPE AND SCREEN
ABO/RH(D): AB POS
Antibody Screen: NEGATIVE
Unit division: 0
Unit division: 0
Unit division: 0
Unit division: 0
Unit division: 0
Unit division: 0

## 2021-08-09 LAB — BPAM RBC
Blood Product Expiration Date: 202305122359
Blood Product Expiration Date: 202305122359
Blood Product Expiration Date: 202305202359
Blood Product Expiration Date: 202305202359
Blood Product Expiration Date: 202305202359
Blood Product Expiration Date: 202305202359
ISSUE DATE / TIME: 202304281224
ISSUE DATE / TIME: 202304281224
ISSUE DATE / TIME: 202305011354
Unit Type and Rh: 6200
Unit Type and Rh: 6200
Unit Type and Rh: 6200
Unit Type and Rh: 6200
Unit Type and Rh: 6200
Unit Type and Rh: 6200

## 2021-08-09 LAB — CBC
HCT: 27.6 % — ABNORMAL LOW (ref 39.0–52.0)
Hemoglobin: 8.9 g/dL — ABNORMAL LOW (ref 13.0–17.0)
MCH: 27.6 pg (ref 26.0–34.0)
MCHC: 32.2 g/dL (ref 30.0–36.0)
MCV: 85.4 fL (ref 80.0–100.0)
Platelets: 213 10*3/uL (ref 150–400)
RBC: 3.23 MIL/uL — ABNORMAL LOW (ref 4.22–5.81)
RDW: 14.8 % (ref 11.5–15.5)
WBC: 11.2 10*3/uL — ABNORMAL HIGH (ref 4.0–10.5)
nRBC: 0.3 % — ABNORMAL HIGH (ref 0.0–0.2)

## 2021-08-09 LAB — MAGNESIUM: Magnesium: 1.9 mg/dL (ref 1.7–2.4)

## 2021-08-09 LAB — COMPREHENSIVE METABOLIC PANEL
ALT: 13 U/L (ref 0–44)
AST: 15 U/L (ref 15–41)
Albumin: 2.7 g/dL — ABNORMAL LOW (ref 3.5–5.0)
Alkaline Phosphatase: 66 U/L (ref 38–126)
Anion gap: 7 (ref 5–15)
BUN: 7 mg/dL — ABNORMAL LOW (ref 8–23)
CO2: 26 mmol/L (ref 22–32)
Calcium: 8.2 mg/dL — ABNORMAL LOW (ref 8.9–10.3)
Chloride: 104 mmol/L (ref 98–111)
Creatinine, Ser: 0.9 mg/dL (ref 0.61–1.24)
GFR, Estimated: >60 mL/min (ref >60)
Glucose, Bld: 114 mg/dL — ABNORMAL HIGH (ref 70–99)
Potassium: 3.5 mmol/L (ref 3.5–5.1)
Sodium: 137 mmol/L (ref 135–145)
Total Bilirubin: 0.7 mg/dL (ref 0.3–1.2)
Total Protein: 5.5 g/dL — ABNORMAL LOW (ref 6.5–8.1)

## 2021-08-09 LAB — PHOSPHORUS: Phosphorus: 3.1 mg/dL (ref 2.5–4.6)

## 2021-08-09 MED ORDER — HYDRALAZINE HCL 25 MG PO TABS
25.0000 mg | ORAL_TABLET | Freq: Three times a day (TID) | ORAL | Status: DC | PRN
  Administered 2021-08-09: 25 mg via ORAL
  Filled 2021-08-09: qty 1

## 2021-08-09 MED ORDER — FINASTERIDE 5 MG PO TABS
5.0000 mg | ORAL_TABLET | Freq: Every morning | ORAL | Status: DC
  Administered 2021-08-09 – 2021-08-14 (×6): 5 mg via ORAL
  Filled 2021-08-09 (×6): qty 1

## 2021-08-09 MED ORDER — LEVALBUTEROL HCL 0.63 MG/3ML IN NEBU
0.6300 mg | INHALATION_SOLUTION | Freq: Four times a day (QID) | RESPIRATORY_TRACT | Status: AC | PRN
  Administered 2021-08-09 (×2): 0.63 mg via RESPIRATORY_TRACT
  Filled 2021-08-09 (×3): qty 3

## 2021-08-09 MED ORDER — AMIODARONE HCL 200 MG PO TABS: 200.0000 mg | ORAL_TABLET | Freq: Every day | ORAL | Status: DC

## 2021-08-09 MED ORDER — FUROSEMIDE 10 MG/ML IJ SOLN
40.0000 mg | Freq: Once | INTRAMUSCULAR | Status: AC
  Administered 2021-08-09: 40 mg via INTRAVENOUS
  Filled 2021-08-09: qty 4

## 2021-08-09 MED ORDER — AMIODARONE HCL 200 MG PO TABS
400.0000 mg | ORAL_TABLET | Freq: Two times a day (BID) | ORAL | Status: DC
Start: 2021-08-09 — End: 2021-08-14
  Administered 2021-08-09 – 2021-08-14 (×11): 400 mg via ORAL
  Filled 2021-08-09 (×11): qty 2

## 2021-08-09 MED ORDER — ENALAPRIL MALEATE 10 MG PO TABS
10.0000 mg | ORAL_TABLET | Freq: Every day | ORAL | Status: DC
  Administered 2021-08-09 – 2021-08-14 (×6): 10 mg via ORAL
  Filled 2021-08-09 (×7): qty 1

## 2021-08-09 MED ORDER — ATENOLOL 50 MG PO TABS
50.0000 mg | ORAL_TABLET | Freq: Every morning | ORAL | Status: DC
  Administered 2021-08-09 – 2021-08-14 (×6): 50 mg via ORAL
  Filled 2021-08-09: qty 1
  Filled 2021-08-09: qty 2
  Filled 2021-08-09: qty 1
  Filled 2021-08-09: qty 2
  Filled 2021-08-09: qty 1
  Filled 2021-08-09: qty 2
  Filled 2021-08-09: qty 1

## 2021-08-09 MED ORDER — PROCHLORPERAZINE EDISYLATE 10 MG/2ML IJ SOLN
5.0000 mg | Freq: Four times a day (QID) | INTRAMUSCULAR | Status: DC | PRN
  Administered 2021-08-09: 5 mg via INTRAVENOUS
  Filled 2021-08-09: qty 2

## 2021-08-09 MED ORDER — CEFAZOLIN SODIUM-DEXTROSE 2-4 GM/100ML-% IV SOLN
2.0000 g | Freq: Three times a day (TID) | INTRAVENOUS | Status: DC
  Administered 2021-08-09 – 2021-08-14 (×15): 2 g via INTRAVENOUS
  Filled 2021-08-09 (×18): qty 100

## 2021-08-09 MED ORDER — TAMSULOSIN HCL 0.4 MG PO CAPS
0.4000 mg | ORAL_CAPSULE | Freq: Every day | ORAL | Status: DC
  Administered 2021-08-09 – 2021-08-13 (×5): 0.4 mg via ORAL
  Filled 2021-08-09 (×5): qty 1

## 2021-08-09 MED ORDER — CEFAZOLIN SODIUM-DEXTROSE 1-4 GM/50ML-% IV SOLN
1.0000 g | Freq: Three times a day (TID) | INTRAVENOUS | Status: DC
  Filled 2021-08-09: qty 50

## 2021-08-09 NOTE — Progress Notes (Signed)
?  Transition of Care (TOC) Screening Note ? ? ?Patient Details  ?Name: Joshua Alvarez ?Date of Birth: [birth year] ? ? ?Transition of Care (TOC) CM/SW Contact:    ?Detron Carras, LCSW ?Phone Number: ?08/09/2021, 10:47 AM ? ? ? ?Transition of Care Department [facility]) has reviewed patient and no TOC needs have been identified at this time. We will continue to monitor patient advancement through interdisciplinary progression rounds. If new patient transition needs arise, please place a TOC consult. ? ? ?

## 2021-08-09 NOTE — Progress Notes (Signed)
? ?PROGRESS NOTE ? ?Joshua Alvarez RKY:[id number] DOB: 09-04-1945 DOA: 08/05/2021 ?PCP: Coral Spikes, DO ? ?HPI/Recap of past 24 hours: ?Joshua Alvarez is a 76 y.o. male with a PMH significant for bladder spasms, prostatic hypertrophy, kidney stones, hypertension, paroxysmal atrial fibrillation on Eliquis, skin cancer, and arthritis presented at [facility] ED with complaints of urinary obstruction.  Patient was seen at this facility night prior with exchange of Foley catheter due to same complaint.  Patient's Foley catheter was exchanged in ED with good return of urine seen and patient was discharged from emergency department.  When the obstructed symptoms returned, patient presented to ED once again for further assistance.  Patient had significant bleeding from urethra with inability to place Foley catheter.  Urology consulted and placed the catheter successfully.  His blood pressure dropped requiring vasopressors.  He was admitted to the ICU due to hemorrhagic and septic shock.  His blood culture returned positive for E. coli.  Currently on Rocephin. ? ?Care transferred to [facility] on 08/08/2021. ? ?08/08/2021: Patient was seen and examined at his bedside while in the stepdown unit.  His wife was present at bedside.  He denies any abdominal pain or nausea at the time of the visit.  Continues to have hematuria.  Hemoglobin dropped to 7.8, will transfuse 1 unit PRBC.  Consent for blood transfusion was obtained from the patient and his wife at bedside. ? ?Subjective.  Patient continued to have hematuria, urology is recommending continuation of CBI.  Received 1 unit of PRBC yesterday and hemoglobin at 8.9 this morning.  Blood pressure started trending up, restarting home atenolol and enalapril. ?Cardiology was also consulted as urology need cardiology clearance for TURP. ?Spontaneously converted back to sinus rhythm.  Amiodarone infusion was discontinued. ?Will not be able to start anticoagulation due to ongoing  hematuria. ? ?Assessment/Plan: ?Principal Problem: ?  Acute blood loss anemia ? ?Hemorrhagic and Septic Shock secondary to acute blood loss anemia from traumatic Foley placement and E. coli bacteremia, respectively. ?Treat underlying conditions-off vasopressors-holding off anticoagulation. ?Hemoglobin improved to 8.9 after getting second unit of PRBC.   ?Continue to have hematuria. ?Continue with CBI ?Switch ceftriaxone to cefazolin based on sensitivity, can be discharged home on cefadroxil to complete a 2-week course. ?Continue with supportive care ?Continue to monitor hemoglobin-transfuse if below 8. ? ?Lactic Acidosis in the setting of hemorrhagic/septic shock. ?Presenting lactic acid 3.9, down trended. ?Off vasopressors.  Lactic acidosis resolved ?Maintain hemoglobin greater than 8 in the setting of ongoing bleeding. ?  ?Hematuria with nondraining Foley catheter ?History of BPH with urinary retention  ?- Urology following ?- Continue CBI ?- Continue foley ?  ?Resolved acute Kidney Injury -  ?In setting of sepsis and shock ?- Monitor renal function ?Renal function is back to baseline. ?  ?Atrial Fibrillation with RVR ?Paroxysmal A-fib anticoagulated at baseline  ?Hx of HTN/HLD ?Spontaneously converted back to sinus rhythm. ?Cardiology was also consulted and amiodarone was discontinued. ?Restarting home atenolol as blood pressure started trending up and that will also help with rate control. ?Holding off Eliquis  ?Continue to monitor on telemetry ?  ?Bladder Spasms ?- Continue oxybutynin '5mg'$  TID ?- PRN fentanyl IV ?  ?Critical care time: 50 minutes. ? ? ?Code Status: Full code ? ?Family Communication: Discussed with wife at bedside ? ?Disposition Plan: Most likely home with home health once cleared from urology. ? ? ?Consultants: ?Urology ?PCCM ? ?Procedures: ?Foley catheter placement and bladder irrigation ? ?Antimicrobials: ?Rocephin ? ?DVT prophylaxis: ?SCDs ? ?  Status is: Inpatient ?Patient requires at least 2  midnights for further evaluation and treatment of present condition. ? ? ?Objective: ?Vitals:  ? 08/09/21 0933 08/09/21 1000 08/09/21 1100 08/09/21 1200  ?BP: (!) 161/74 (!) 204/106 (!) 163/90 (!) 162/107  ?Pulse:  78 62 65  ?Resp:  (!) 36 (!) 30 (!) 29  ?Temp:    97.8 ?F (36.6 ?C)  ?TempSrc:    Oral  ?SpO2:  (!) 89% 100% (!) 86%  ?Weight:      ?Height:      ? ? ?Intake/Output Summary (Last 24 hours) at 08/09/2021 1349 ?Last data filed at 08/09/2021 1017 ?Gross per 24 hour  ?Intake 23885.19 ml  ?Output 26350 ml  ?Net -2464.81 ml  ? ? ?Filed Weights  ? 08/07/21 0500 08/08/21 0500 08/09/21 0500  ?Weight: 80.9 kg 81 kg 80.5 kg  ? ? ?Exam: ? ?General.  Ill-appearing elderly man, in no acute distress. ?Pulmonary.  Scant scattered wheeze bilaterally, normal respiratory effort. ?CV.  Regular rate and rhythm, no JVD, rub or murmur. ?Abdomen.  Soft, nontender, nondistended, BS positive. ?CNS.  Alert and oriented .  No focal neurologic deficit. ?Extremities.  No edema, no cyanosis, pulses intact and symmetrical. ?Psychiatry.  Judgment and insight appears normal.  ? ?Data Reviewed: ?CBC: ?Recent Labs  ?Lab 08/05/21 ?0828 08/05/21 ?1107 08/07/21 ?0243 08/07/21 ?2025 08/07/21 ?1603 08/08/21 ?0250 08/09/21 ?4270  ?WBC 17.7*   < > 15.5* 15.0* 14.3* 11.8* 11.2*  ?NEUTROABS 15.2*  --   --   --   --   --   --   ?HGB 13.1   < > 8.0* 7.9* 8.6* 7.8* 8.9*  ?HCT 39.3   < > 24.7* 24.3* 26.8* 23.8* 27.6*  ?MCV 82.9   < > 82.3 82.9 84.8 83.5 85.4  ?PLT 355   < > 163 168 170 174 213  ? < > = values in this interval not displayed.  ? ? ?Basic Metabolic Panel: ?Recent Labs  ?Lab 08/05/21 ?6237 08/06/21 ?6283 08/07/21 ?0243 08/08/21 ?0250 08/09/21 ?1517  ?NA 130* 138 137 138 137  ?K 3.8 4.3 3.4* 3.6 3.5  ?CL 97* 108 109 105 104  ?CO2 '22 25 24 26 26  '$ ?GLUCOSE 215* 127* 112* 112* 114*  ?BUN '19 19 13 '$ 7* 7*  ?CREATININE 1.46* 1.25* 1.00 0.94 0.90  ?CALCIUM 9.1 8.2* 7.7* 7.9* 8.2*  ?MG  --  2.1 1.8  --  1.9  ?PHOS  --  2.5  --   --  3.1   ? ? ?GFR: ?Estimated Creatinine Clearance: 72.1 mL/min (by C-G formula based on SCr of 0.9 mg/dL). ?Liver Function Tests: ?Recent Labs  ?Lab 08/09/21 ?0238  ?AST 15  ?ALT 13  ?ALKPHOS 66  ?BILITOT 0.7  ?PROT 5.5*  ?ALBUMIN 2.7*  ? ?No results for input(s): LIPASE, AMYLASE in the last 168 hours. ?No results for input(s): AMMONIA in the last 168 hours. ?Coagulation Profile: ?No results for input(s): INR, PROTIME in the last 168 hours. ?Cardiac Enzymes: ?No results for input(s): CKTOTAL, CKMB, CKMBINDEX, TROPONINI in the last 168 hours. ?BNP (last 3 results) ?No results for input(s): PROBNP in the last 8760 hours. ?HbA1C: ?No results for input(s): HGBA1C in the last 72 hours. ?CBG: ?No results for input(s): GLUCAP in the last 168 hours. ?Lipid Profile: ?No results for input(s): CHOL, HDL, LDLCALC, TRIG, CHOLHDL, LDLDIRECT in the last 72 hours. ?Thyroid Function Tests: ?No results for input(s): TSH, T4TOTAL, FREET4, T3FREE, THYROIDAB in the last 72 hours. ?Anemia Panel: ?No  results for input(s): VITAMINB12, FOLATE, FERRITIN, TIBC, IRON, RETICCTPCT in the last 72 hours. ?Urine analysis: ?No results found for: COLORURINE, APPEARANCEUR, [place], [place], [place], [place], [place], KETONESUR, PROTEINUR, [place], NITRITE, LEUKOCYTESUR ?Sepsis Labs: ?'@LABRCNTIP'$ (procalcitonin:4,lacticidven:4) ? ?) ?Recent Results (from the past 240 hour(s))  ?Culture, blood (Routine X 2) w Reflex to ID Panel     Status: Abnormal  ? Collection Time: 08/05/21  4:10 PM  ? Specimen: BLOOD  ?Result Value Ref Range Status  ? Specimen Description   Final  ?  BLOOD ?Performed at [facility], [place] [address], [place], [place] [address] ?  ? Special Requests   Final  ?  BOTTLES DRAWN AEROBIC AND ANAEROBIC Blood Culture results may not be optimal due to an inadequate volume of blood received in culture bottles ?Performed at [facility], [place] [address], [place], [place] [address] ?  ? Culture  Setup Time    Final  ?  GRAM NEGATIVE RODS ?IN BOTH AEROBIC AND ANAEROBIC BOTTLES ?CRITICAL RESULT CALLED TO, READ BACK BY AND VERIFIED WITH: PHARMD C SHADE [id number] AT 838 AM BY CM ?Performed at [facility], [place] [address] [place]

## 2021-08-09 NOTE — Progress Notes (Signed)
?  Interval/Subjective: ?Transfused 1 unit PRBC ?Converted to sinus rhythm at 4am ?Consistently hypertensive ?Urine clearing ?Some nausea/emesis overnight secondary to cepacol lozenge ?No pain ? ?Objective: ?Vital signs in last 24 hours: ?Temp:  [97.4 ?F (36.3 ?C)-98.5 ?F (36.9 ?C)] 97.8 ?F (36.6 ?C) (05/02 0400) ?Pulse Rate:  [73-136] 76 (05/02 0700) ?Resp:  [18-35] 26 (05/02 0700) ?BP: (132-181)/(59-118) 156/105 (05/02 0700) ?SpO2:  [85 %-100 %] 100 % (05/02 0700) ?Weight:  [80.5 kg] 80.5 kg (05/02 0500) ? ?Intake/Output from previous day: ?05/01 0701 - 05/02 0700 ?In: 23812.2 [P.O.:480; I.V.:1764.5; Blood:467.7; IV Piggyback:100] ?Out: 38100 [LVDIX:18550] ?Intake/Output this shift: ?No intake/output data recorded. ? ?Physical Exam:  ?General: Alert and oriented ?CV: tachycardia/irregular ?Lungs: Clear ?Abdomen: Soft, ND, ATTP ?Ext: NT, No erythema ? ?Foley draining clear - on slow CBI ? ?Lab Results: ?Recent Labs  ?  08/07/21 ?1603 08/08/21 ?0250 08/09/21 ?0238  ?HGB 8.6* 7.8* 8.9*  ?HCT 26.8* 23.8* 27.6*  ? ?BMET ?Recent Labs  ?  08/08/21 ?0250 08/09/21 ?0238  ?NA 138 137  ?K 3.6 3.5  ?CL 105 104  ?CO2 26 26  ?GLUCOSE 112* 114*  ?BUN 7* 7*  ?CREATININE 0.94 0.90  ?CALCIUM 7.9* 8.2*  ? ? ? ?Studies/Results: ?No results found. ? ?Assessment/Plan: ?Joshua Alvarez is with gross hematuria from urosepsis - urine appears to be clearing, bleeding stopped.  ESBL E.coli w/ only oral option Keflex.  Has converted to back to sinus.  REmains hypertensive. ? ?Have stopped his CBI - if it remains clear, cap irrigation port. ?Keep on IV antibiotics until discharge - transition over to keflex. ? ?Will keep following while in house. ? ? ? LOS: 4 days  ? ?Joshua Alvarez ?08/09/2021, 7:29 AM ?[facility]  ? ? ? ?

## 2021-08-09 NOTE — Consult Note (Signed)
?Cardiology Consultation:  ?Patient ID: Joshua Alvarez ?MRN: [id number]; DOB: March 02, 1946 ? ?Admit date: 08/05/2021 ?Date of Consult: 08/09/2021 ? ?Primary Care Provider: Coral Spikes, DO ?Primary Cardiologist: Rozann Lesches, MD  ?Primary Electrophysiologist:  None  ? ?Patient Profile:  ?Joshua Alvarez is a 76 y.o. male with a hx of bladder spasms, hypertension, paroxysmal atrial fibrillation, arthritis who is being seen today for the evaluation of preoperative assessment/atrial fibrillation at the request of Lorella Nimrod, MD. ? ?History of Present Illness:  ?Mr. Taite was admitted to the hospital on 08/05/2021 with symptoms of urinary obstruction.  He subsequently developed hypotension related to hemorrhagic shock in the setting of profound hematuria.  Course has also been complicated by E. coli bacteremia as well as septic shock with lactic acidosis.  He was noted to be in A-fib with RVR and due to hypotension requiring pressors he was placed on amiodarone drip.  Has subsequently converted to sinus rhythm on amiodarone drip.  He is maintaining sinus rhythm.  Remains hypertensive in the emergency room.  Reports overall symptoms are much improved.  There is possible plans for urological procedure.  Urology has recommended preoperative assessment.  Clearly he is not a candidate for anticoagulation moving forward.  He needs his underlying condition treated.  Laboratory data on arrival was notable for acute kidney injury.  This has resolved.  Lactic acidosis has resolved.  Profound leukocytosis also has resolved.  Recent echocardiogram demonstrates normal LV function. ? ?Heart Pathway Score:   ?   ? ?Past Medical History: ?Past Medical History:  ?Diagnosis Date  ? Allergic rhinitis   ? Arthritis   ? Bladder spasms   ? Colon polyp   ? History of kidney stones   ? x 3   ? Hypertension   ? Migraine headache   ? Paroxysmal atrial fibrillation (HCC)   ? Diagnosed April 2023  ? Skin cancer   ? Urgency of urination   ? ? ?Past  Surgical History: ?Past Surgical History:  ?Procedure Laterality Date  ? COLONOSCOPY    ? COLONOSCOPY N/A 02/11/2014  ? Procedure: COLONOSCOPY;  Surgeon: Rogene Houston, MD;  [facility];  Service: Endoscopy;  Laterality: N/A;  730  ? CYSTOSCOPY    ? x 2 - stone removal  ? FINGER SURGERY Right   ? right 5th finger  ? INGUINAL HERNIA REPAIR Bilateral   ? INSERTION OF MESH Left 05/04/2020  ? Procedure: INSERTION OF MESH;  Surgeon: Ralene Ok, MD;  Location: [place];  Service: General;  Laterality: Left;  ? RIGHT KNEE REPAIRED    ? XI ROBOTIC ASSISTED INGUINAL HERNIA REPAIR WITH MESH Left 05/04/2020  ? Procedure: XI ROBOTIC ASSISTED LEFT INGUINAL HERNIA REPAIR WITH MESH;  Surgeon: Ralene Ok, MD;  Location: [place];  Service: General;  Laterality: Left;  ?  ? ?Home Medications:  ?Prior to Admission medications   ?Medication Sig Start Date End Date Taking? Authorizing Provider  ?apixaban (ELIQUIS) 5 MG TABS tablet Take 1 tablet (5 mg total) by mouth 2 (two) times daily. 07/13/21 08/12/21 Yes Cook, Jayce G, DO  ?atenolol (TENORMIN) 100 MG tablet Take 50 mg by mouth every morning.   Yes [provider]  ?enalapril (VASOTEC) 10 MG tablet Take 1 tablet (10 mg total) by mouth daily. ?Patient taking differently: Take 10 mg by mouth every morning. 05/16/21  Yes Cook, Michael Boston G, DO  ?finasteride (PROSCAR) 5 MG tablet Take 5 mg by mouth every morning. 06/24/21  Yes [provider]  ?hydrochlorothiazide (HYDRODIURIL) 25 MG tablet TAKE ONE-HALF TABLET (12.'5MG'$  TOTAL) BY MOUTH EVERY MORNING ?Patient taking differently: Take 12.5 mg by mouth every morning. 05/16/21  Yes Cook, Michael Boston G, DO  ?tamsulosin (FLOMAX) 0.4 MG CAPS capsule TAKE ONE CAPSULE (0.'4MG'$  TOTAL) BY MOUTH DAILY ?Patient taking differently: 0.4 mg every morning. 05/16/21  Yes Cook, Jayce G, DO  ?benzonatate (TESSALON) 200 MG capsule Take 1 capsule (200 mg total) by mouth 3 (three) times daily as needed for cough. ?Patient not taking: Reported on  07/18/2021 07/14/21   Coral Spikes, DO  ?ezetimibe (ZETIA) 10 MG tablet Take 1 tablet (10 mg total) by mouth daily. ?Patient not taking: Reported on 08/04/2021 05/31/21   Coral Spikes, DO  ?mirabegron ER (MYRBETRIQ) 25 MG TB24 tablet Take 1 tablet (25 mg total) by mouth daily. As needed for bladder spasm or dysuria ?Patient not taking: Reported on 08/05/2021 08/04/21   Daleen Bo, MD  ? ? ?Inpatient Medications: ?Scheduled Meds: ? sodium chloride   Intravenous Once  ? atenolol  50 mg Oral q morning  ? Chlorhexidine Gluconate Cloth  6 each Topical Daily  ? enalapril  10 mg Oral Daily  ? finasteride  5 mg Oral q morning  ? mouth rinse  15 mL Mouth Rinse BID  ? oxybutynin  5 mg Oral QID  ? polyethylene glycol  17 g Oral Daily  ? tamsulosin  0.4 mg Oral QPC supper  ? ?Continuous Infusions: ? amiodarone 30 mg/hr (08/09/21 0000)  ? cefTRIAXone (ROCEPHIN)  IV Stopped (08/08/21 0955)  ? sodium chloride irrigation    ? ?PRN Meds: ?acetaminophen, docusate sodium, fentaNYL (SUBLIMAZE) injection, levalbuterol, menthol-cetylpyridinium, ondansetron (ZOFRAN) IV ? ?Allergies:    ?Allergies  ?Allergen Reactions  ? Statins Other (See Comments)  ?  Muscle aches joint pain unable to take any statins have tried several  ? Clarithromycin Swelling  ?  Swelling face and in mouth  ? ? ?Social History:   ?Social History  ? ?Socioeconomic History  ? Marital status: Married  ?  Spouse name: Lelon Frohlich  ? Number of children: 2  ? Years of education: Not on file  ? Highest education level: Not on file  ?Occupational History  ? Not on file  ?Tobacco Use  ? Smoking status: Former  ?  Years: 30.00  ?  Types: Cigarettes  ?  Quit date: 04/2010  ?  Years since quitting: 11.3  ? Smokeless tobacco: Never  ?Vaping Use  ? Vaping Use: Never used  ?Substance and Sexual Activity  ? Alcohol use: Not Currently  ? Drug use: Never  ? Sexual activity: Not on file  ?Other Topics Concern  ? Not on file  ?Social History Narrative  ? Retired from [employer] in  Engineer, materials.  ? 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  ? 1 grandson and 1 granddaughter  ? 2 step grandsons  ? ?Social Determinants of Health  ? ?Financial Resource Strain: Low Risk   ? Difficulty of Paying Living Expenses: Not hard at all  ?Food Insecurity: No Food Insecurity  ? Worried About Charity fundraiser in the Last Year: Never true  ? Ran Out of Food in the Last Year: Never true  ?Transportation Needs: No Transportation Needs  ? Lack of Transportation (Medical): No  ? Lack of Transportation (Non-Medical): No  ?Physical Activity: Insufficiently Active  ? Days of Exercise per Week: 4 days  ? Minutes of Exercise per Session: 30 min  ?Stress: No Stress Concern  Present  ? Feeling of Stress : Not at all  ?Social Connections: Socially Integrated  ? Frequency of Communication with Friends and Family: More than three times a week  ? Frequency of Social Gatherings with Friends and Family: More than three times a week  ? Attends Religious Services: More than 4 times per year  ? Active Member of Clubs or Organizations: Yes  ? Attends Archivist Meetings: More than 4 times per year  ? Marital Status: Married  ?Intimate Partner Violence: Not At Risk  ? Fear of Current or Ex-Partner: No  ? Emotionally Abused: No  ? Physically Abused: No  ? Sexually Abused: No  ?  ? ?Family History:   ? ?Family History  ?Problem Relation Age of Onset  ? Cancer Mother   ?     Breast  ? Heart attack Father   ?  ? ?ROS:  ?All other ROS reviewed and negative. Pertinent positives noted in the HPI.    ? ?Physical Exam/Data:  ? ?Vitals:  ? 08/09/21 0700 08/09/21 0730 08/09/21 0744 08/09/21 0800  ?BP: (!) 156/105 (!) 181/96  (!) 169/96  ?Pulse: 76 78  77  ?Resp: (!) 26 (!) 25  (!) 25  ?Temp:   97.8 ?F (36.6 ?C)   ?TempSrc:   Axillary   ?SpO2: 100% 98%  94%  ?Weight:      ?Height:      ?  ?Intake/Output Summary (Last 24 hours) at 08/09/2021 0820 ?Last data filed at 08/09/2021 0700 ?Gross per 24 hour  ?Intake 23812.2 ml  ?Output 38100 ml  ?Net -14287.8 ml   ?  ? ?  08/09/2021  ?  5:00 AM 08/08/2021  ?  5:00 AM 08/07/2021  ?  5:00 AM  ?Last 3 Weights  ?Weight (lbs) 177 lb 7.5 oz 178 lb 9.2 oz 178 lb 5.6 oz  ?Weight (kg) 80.5 kg 81 kg 80.9 kg  ?  Body mass index is 27.8 kg/m

## 2021-08-10 DIAGNOSIS — I10 Essential (primary) hypertension: Secondary | ICD-10-CM | POA: Diagnosis not present

## 2021-08-10 DIAGNOSIS — R58 Hemorrhage, not elsewhere classified: Secondary | ICD-10-CM

## 2021-08-10 DIAGNOSIS — I480 Paroxysmal atrial fibrillation: Secondary | ICD-10-CM | POA: Diagnosis not present

## 2021-08-10 DIAGNOSIS — D62 Acute posthemorrhagic anemia: Secondary | ICD-10-CM | POA: Diagnosis not present

## 2021-08-10 DIAGNOSIS — N179 Acute kidney failure, unspecified: Secondary | ICD-10-CM | POA: Diagnosis not present

## 2021-08-10 LAB — COMPREHENSIVE METABOLIC PANEL
ALT: 18 U/L (ref 0–44)
AST: 27 U/L (ref 15–41)
Albumin: 3.2 g/dL — ABNORMAL LOW (ref 3.5–5.0)
Alkaline Phosphatase: 95 U/L (ref 38–126)
Anion gap: 11 (ref 5–15)
BUN: 9 mg/dL (ref 8–23)
CO2: 29 mmol/L (ref 22–32)
Calcium: 8.6 mg/dL — ABNORMAL LOW (ref 8.9–10.3)
Chloride: 96 mmol/L — ABNORMAL LOW (ref 98–111)
Creatinine, Ser: 1 mg/dL (ref 0.61–1.24)
GFR, Estimated: >60 mL/min (ref >60)
Glucose, Bld: 127 mg/dL — ABNORMAL HIGH (ref 70–99)
Potassium: 3.3 mmol/L — ABNORMAL LOW (ref 3.5–5.1)
Sodium: 136 mmol/L (ref 135–145)
Total Bilirubin: 0.8 mg/dL (ref 0.3–1.2)
Total Protein: 6.8 g/dL (ref 6.5–8.1)

## 2021-08-10 MED ORDER — LEVALBUTEROL HCL 0.63 MG/3ML IN NEBU
0.6300 mg | INHALATION_SOLUTION | Freq: Four times a day (QID) | RESPIRATORY_TRACT | Status: DC | PRN
  Administered 2021-08-10: 0.63 mg via RESPIRATORY_TRACT
  Filled 2021-08-10: qty 3

## 2021-08-10 MED ORDER — POTASSIUM CHLORIDE 20 MEQ PO PACK
40.0000 meq | PACK | Freq: Once | ORAL | Status: AC
  Administered 2021-08-10: 40 meq via ORAL
  Filled 2021-08-10: qty 2

## 2021-08-10 MED ORDER — GUAIFENESIN 100 MG/5ML PO LIQD
5.0000 mL | ORAL | Status: DC | PRN
  Administered 2021-08-10 – 2021-08-12 (×4): 5 mL via ORAL
  Filled 2021-08-10 (×4): qty 10

## 2021-08-10 MED ORDER — LEVALBUTEROL HCL 0.63 MG/3ML IN NEBU
0.6300 mg | INHALATION_SOLUTION | Freq: Three times a day (TID) | RESPIRATORY_TRACT | Status: DC
  Administered 2021-08-11 – 2021-08-12 (×4): 0.63 mg via RESPIRATORY_TRACT
  Filled 2021-08-10 (×4): qty 3

## 2021-08-10 MED ORDER — FUROSEMIDE 10 MG/ML IJ SOLN
40.0000 mg | Freq: Once | INTRAMUSCULAR | Status: AC
  Administered 2021-08-10: 40 mg via INTRAVENOUS
  Filled 2021-08-10: qty 4

## 2021-08-10 NOTE — Progress Notes (Signed)
Rt gave pt flutter valve per MD order. Pt knows and understands how to use. 

## 2021-08-10 NOTE — Hospital Course (Signed)
76 y.o. male with a PMH significant for bladder spasms, prostatic hypertrophy, kidney stones, hypertension, paroxysmal atrial fibrillation on Eliquis, skin cancer, and arthritis presented at [facility] ED with complaints of urinary obstruction.  Patient was seen at this facility night prior with exchange of Foley catheter due to same complaint.  Patient's Foley catheter was exchanged in ED with good return of urine seen and patient was discharged from emergency department.  When the obstructed symptoms returned, patient presented to ED once again for further assistance.  Patient had significant bleeding from urethra with inability to place Foley catheter.  Urology consulted and placed the catheter successfully.  His blood pressure dropped requiring vasopressors.  He was admitted to the ICU due to hemorrhagic and septic shock.  His blood culture returned positive for E. coli. Care transferred to [facility] on 08/08/2021. Pt was noted to have continuing hematuria with Urology following for CBI ?

## 2021-08-10 NOTE — Progress Notes (Signed)
?  Progress Note ? ? ?Patient: Joshua Alvarez SUG:[id number] DOB: [birth year] DOA: 08/05/2021     5 ?DOS: the patient was seen and examined on 08/10/2021 ?  ?Brief hospital course: ?No notes on file ? ?Assessment and Plan: ?No notes have been filed under this hospital service. ?Service: Hospitalist ? ?Hemorrhagic and Septic Shock secondary to acute blood loss anemia from traumatic Foley placement and E. coli bacteremia, respectively. ?Treat underlying conditions-off vasopressors-holding off anticoagulation. ?Hemoglobin improved to 8.9 after getting second unit of PRBC.   ?Continue to have pink urine via CBI ?Continue with CBI as per Urology ?Now on cefazolin based on sensitivity, can be discharged home on cefadroxil to complete a 2-week course. ?Continue with supportive care ?Continue to monitor hemoglobin-transfuse if below 8. ?  ?Lactic Acidosis in the setting of hemorrhagic/septic shock. ?Presenting lactic acid 3.9, down trended. ?Off vasopressors.  Lactic acidosis resolved ?Maintain hemoglobin greater than 8 in the setting of ongoing bleeding. ?  ?Hematuria with nondraining Foley catheter ?History of BPH with urinary retention  ?- Urology following ?- Continue CBI, plan to wean as tolerated ?- Continue foley ?  ?Resolved acute Kidney Injury -  ?In setting of sepsis and shock ?- Cr remains baseline and normal ?  ?Atrial Fibrillation with RVR ?Paroxysmal A-fib anticoagulated at baseline  ?Hx of HTN/HLD ?Spontaneously converted back to sinus rhythm. ?Cardiology was also consulted. Initially required amio gtt. Now, recs for short course of amiodarone ?Anticoagulation currently contraindicated given above blood loss, to be re-evaluated by Cardiology as outpatient ?On home atenolol as blood pressure started trending up and that will also help with rate control. ?  ?Bladder Spasms ?- Continue oxybutynin '5mg'$  TID ?- PRN fentanyl IV ?  ? ?  ? ?Subjective: Still having pink colored urine via CBI ? ?Physical Exam: ?Vitals:  ?  08/10/21 1000 08/10/21 1100 08/10/21 1122 08/10/21 1200  ?BP: 117/78   139/80  ?Pulse: 70 68  61  ?Resp: (!) 23 (!) 33  (!) 23  ?Temp:   97.6 ?F (36.4 ?C) 97.6 ?F (36.4 ?C)  ?TempSrc:   Oral Oral  ?SpO2: 92% 93%  93%  ?Weight:      ?Height:      ? ?General exam: Awake, laying in bed, in nad ?Respiratory system: Normal respiratory effort, no wheezing ?Cardiovascular system: regular rate, s1, s2 ?Gastrointestinal system: Soft, nondistended, positive BS ?Central nervous system: CN2-12 grossly intact, strength intact ?Extremities: Perfused, no clubbing ?Skin: Normal skin turgor, no notable skin lesions seen ?Psychiatry: Mood normal // no visual hallucinations  ? ?Data Reviewed: ? ?Cr 0.90 ? ?Family Communication: Pt in room, family at bedside ? ?Disposition: ?Status is: Inpatient ?Remains inpatient appropriate because: Severity of illness ? Planned Discharge Destination: Home with Home Health ? ? ? ? ?Author: ?Marylu Lund, MD ?08/10/2021 1:39 PM ? ?For on call review [email or web address].  ?

## 2021-08-10 NOTE — Progress Notes (Signed)
?Cardiology Progress Note  ?Patient ID: Joshua Alvarez ?MRN: [id number] ?DOB: [birth year] ?Date of Encounter: 08/10/2021 ? ?Primary Cardiologist: Rozann Lesches, MD ? ?Subjective  ? ?Chief Complaint: None.  ? ?HPI: Maintaining sinus rhythm.  Reports no chest pain or trouble breathing.  Still slightly volume up.  Overall doing well.  No further bleeding. ? ?ROS:  ?All other ROS reviewed and negative. Pertinent positives noted in the HPI.    ? ?Inpatient Medications  ?Scheduled Meds: ? sodium chloride   Intravenous Once  ? amiodarone  400 mg Oral BID  ? Followed by  ? [START ON 08/16/2021] amiodarone  200 mg Oral Daily  ? atenolol  50 mg Oral q morning  ? Chlorhexidine Gluconate Cloth  6 each Topical Daily  ? enalapril  10 mg Oral Daily  ? finasteride  5 mg Oral q morning  ? furosemide  40 mg Intravenous Once  ? mouth rinse  15 mL Mouth Rinse BID  ? oxybutynin  5 mg Oral QID  ? polyethylene glycol  17 g Oral Daily  ? potassium chloride  40 mEq Oral Once  ? tamsulosin  0.4 mg Oral QPC supper  ? ?Continuous Infusions: ?  ceFAZolin (ANCEF) IV Stopped (08/10/21 3149)  ? sodium chloride irrigation    ? ?PRN Meds: ?acetaminophen, docusate sodium, fentaNYL (SUBLIMAZE) injection, hydrALAZINE, levalbuterol, menthol-cetylpyridinium, ondansetron (ZOFRAN) IV, prochlorperazine  ? ?Vital Signs  ? ?Vitals:  ? 08/10/21 0755 08/10/21 0800 08/10/21 0900 08/10/21 1122  ?BP:  (!) 155/79 (!) 145/94   ?Pulse:  73 73   ?Resp:  (!) 24 20   ?Temp: 98.3 ?F (36.8 ?C) 98.3 ?F (36.8 ?C)  97.6 ?F (36.4 ?C)  ?TempSrc: Axillary Axillary  Oral  ?SpO2:  96% 99%   ?Weight:      ?Height:      ? ? ?Intake/Output Summary (Last 24 hours) at 08/10/2021 1147 ?Last data filed at 08/10/2021 0800 ?Gross per 24 hour  ?Intake 7798.94 ml  ?Output 10950 ml  ?Net -3151.06 ml  ? ? ?  08/10/2021  ?  5:00 AM 08/09/2021  ?  5:00 AM 08/08/2021  ?  5:00 AM  ?Last 3 Weights  ?Weight (lbs) 177 lb 11.1 oz 177 lb 7.5 oz 178 lb 9.2 oz  ?Weight (kg) 80.6 kg 80.5 kg 81 kg  ?   ? ?Telemetry   ?Overnight telemetry shows sinus rhythm in the 70s, which I personally reviewed.  ? ? ?Physical Exam  ? ?Vitals:  ? 08/10/21 0755 08/10/21 0800 08/10/21 0900 08/10/21 1122  ?BP:  (!) 155/79 (!) 145/94   ?Pulse:  73 73   ?Resp:  (!) 24 20   ?Temp: 98.3 ?F (36.8 ?C) 98.3 ?F (36.8 ?C)  97.6 ?F (36.4 ?C)  ?TempSrc: Axillary Axillary  Oral  ?SpO2:  96% 99%   ?Weight:      ?Height:      ?  ?Intake/Output Summary (Last 24 hours) at 08/10/2021 1147 ?Last data filed at 08/10/2021 0800 ?Gross per 24 hour  ?Intake 7798.94 ml  ?Output 10950 ml  ?Net -3151.06 ml  ?  ? ?  08/10/2021  ?  5:00 AM 08/09/2021  ?  5:00 AM 08/08/2021  ?  5:00 AM  ?Last 3 Weights  ?Weight (lbs) 177 lb 11.1 oz 177 lb 7.5 oz 178 lb 9.2 oz  ?Weight (kg) 80.6 kg 80.5 kg 81 kg  ?  Body mass index is 27.83 kg/m?.  ?General: Well nourished, well developed, in no acute distress ?Head:  Atraumatic, normal size  ?Eyes: PEERLA, EOMI  ?Neck: Supple, JVD 7 8 cm of water ?Endocrine: No thryomegaly ?Cardiac: Normal S1, S2; RRR; no murmurs, rubs, or gallops ?Lungs: Diminished breath sounds bilaterally ?Abd: Soft, nontender, no hepatomegaly  ?Ext: No edema, pulses 2+ ?Musculoskeletal: No deformities, BUE and BLE strength normal and equal ?Skin: Warm and dry, no rashes   ?Neuro: Alert and oriented to person, place, time, and situation, CNII-XII grossly intact, no focal deficits  ?Psych: Normal mood and affect  ? ?Labs  ?High Sensitivity Troponin:  No results for input(s): TROPONINIHS in the last 720 hours.   ?Cardiac EnzymesNo results for input(s): TROPONINI in the last 168 hours. No results for input(s): TROPIPOC in the last 168 hours.  ?Chemistry ?Recent Labs  ?Lab 08/07/21 ?0243 08/08/21 ?0250 08/09/21 ?0238  ?NA 137 138 137  ?K 3.4* 3.6 3.5  ?CL 109 105 104  ?CO2 '24 26 26  '$ ?GLUCOSE 112* 112* 114*  ?BUN 13 7* 7*  ?CREATININE 1.00 0.94 0.90  ?CALCIUM 7.7* 7.9* 8.2*  ?PROT  --   --  5.5*  ?ALBUMIN  --   --  2.7*  ?AST  --   --  15  ?ALT  --   --  13  ?ALKPHOS  --   --  66   ?BILITOT  --   --  0.7  ?GFRNONAA >60 >60 >60  ?ANIONGAP 4* 7 7  ?  ?Hematology ?Recent Labs  ?Lab 08/07/21 ?1603 08/08/21 ?0250 08/09/21 ?0238  ?WBC 14.3* 11.8* 11.2*  ?RBC 3.16* 2.85* 3.23*  ?HGB 8.6* 7.8* 8.9*  ?HCT 26.8* 23.8* 27.6*  ?MCV 84.8 83.5 85.4  ?MCH 27.2 27.4 27.6  ?MCHC 32.1 32.8 32.2  ?RDW 15.1 15.2 14.8  ?PLT 170 174 213  ? ?BNPNo results for input(s): BNP, PROBNP in the last 168 hours.  ?DDimer No results for input(s): DDIMER in the last 168 hours.  ? ?Radiology  ?DG CHEST PORT 1 VIEW ? ?Result Date: 08/09/2021 ?CLINICAL DATA:  Shortness of breath. EXAM: PORTABLE CHEST 1 VIEW COMPARISON:  AP chest 08/06/2021; chest two views 07/13/2021 FINDINGS: Cardiac silhouette is again mildly enlarged. Mediastinal contours are within normal limits with mild calcification within aortic arch. Mild bilateral interstitial thickening/scarring is unchanged. No pleural effusion or pneumothorax. Mild multilevel degenerative disc changes of the thoracic spine with minimal levocurvature of the upper thoracic spine. Severe right glenohumeral osteoarthritis. IMPRESSION: Mild chronic interstitial thickening, likely mild chronic interstitial lung disease. Electronically Signed   By: Yvonne Kendall M.D.   On: 08/09/2021 17:04   ? ?Cardiac Studies  ?TTE 07/29/2021 ? 1. Left ventricular ejection fraction, by estimation, is 60 to 65%. The  ?left ventricle has normal function. The left ventricle has no regional  ?wall motion abnormalities. Left ventricular diastolic parameters are  ?indeterminate.  ? 2. Right ventricular systolic function is normal. The right ventricular  ?size is normal.  ? 3. The mitral valve is normal in structure. Mild mitral valve  ?regurgitation.  ? 4. The aortic valve is abnormal. Aortic valve regurgitation is mild.  ?Aortic valve sclerosis/calcification is present, without any evidence of  ?aortic stenosis.  ? ?Patient Profile  ?Joshua Alvarez is a 76 y.o. male with paroxysmal atrial fibrillation, bladder  spasm, hypertension, arthritis who was admitted to the hospital on 08/05/2021 with acute urinary tract obstruction and sepsis secondary to E. coli bacteremia.  Cardiology was consulted for atrial fibrillation. ? ?Assessment & Plan  ? ?#Paroxysmal Afib ?#Hemorrhagic Shock ?#Septic Shock ?#E coli  bacteremia ?-Admitted to the hospital with hemorrhagic shock secondary to traumatic Foley insertion.  Course complicated by E. coli bacteremia with mixed hemorrhagic and septic shock. ?-Was in A-fib with RVR placed on amiodarone drip.  Has converted back to sinus rhythm. ?-To given there is critical illness would recommend a short course of amiodarone.  This will be 400 mg twice daily for 7 days and then 200 mg daily for 21 days and stop.  Again this is just to maintain sinus rhythm through his critical illness.  This will be short-term. ?-Ejection fraction is normal.  No signs of valvular heart disease. ?-Anticoagulation is currently contraindicated in the setting of hemorrhagic shock.  He will need TURP per urology.  They have requested to hold anticoagulation until after that date.  I think he can be reevaluated for reinitiation of anticoagulation as an outpatient.  Clearly now is not the time.  May be a good candidate for left atrial appendage occlusion in the future. ? ?#Iatrogenic volume overload ?-Was given a lot of fluid for resuscitation.  I have given him an additional 40 mg of Lasix as a one-time dose today.  Should be back to normal tomorrow. ? ?#Hypertension ?-Home blood pressure medications. ? ?#AKI ?-Resolved ? ?[facility] will sign off.   ?Medication Recommendations: Amiodarone as above.  No anticoagulation. ?Other recommendations (labs, testing, etc): Hold anticoagulation until after surgery.  We will discuss this in the outpatient setting if we we will reinitiate. ?Follow up as an outpatient: We will arrange hospital follow-up with Dr. Domenic Polite in 4 to 6 weeks ? ?For questions or updates, please contact  [place] ?Please consult [email or web address] for contact info under  ? ? ?Signed, ?[place] T. Audie Box, MD, [facility] ?[place]  ?08/10/2021 11:47 AM  ? ?

## 2021-08-10 NOTE — Congregational Nurse Program (Signed)
Restarted CBI on slow rate due to visible clots and hematuria. VS within normal limits. Will continue to monitor. ?

## 2021-08-10 NOTE — Progress Notes (Signed)
?  Interval/Subjective: ?No issues overnight ?Started on diuretic given volume overload ?Eval by cardiology, recommended outpatient f/u for discussion of long term anti-coagulation/ LV function otherwise normal. ? ?Objective: ?Vital signs in last 24 hours: ?Temp:  [97.8 ?F (36.6 ?C)-98.5 ?F (36.9 ?C)] 98.3 ?F (36.8 ?C) (05/03 0800) ?Pulse Rate:  [62-76] 73 (05/03 0900) ?Resp:  [16-36] 20 (05/03 0900) ?BP: (118-204)/(58-118) 145/94 (05/03 0900) ?SpO2:  [86 %-100 %] 99 % (05/03 0900) ?Weight:  [80.6 kg] 80.6 kg (05/03 0500) ? ?Intake/Output from previous day: ?05/02 0701 - 05/03 0700 ?In: 8511.7 [I.V.:811.7; IV Piggyback:200] ?Out: 9750 [Urine:9750] ?Intake/Output this shift: ?Total I/O ?In: 198.9 [IV Piggyback:198.9] ?Out: 2000 [Urine:2000] ? ?Physical Exam:  ?General: Alert and oriented ?CV: REgular ?Lungs: Clear ?Abdomen: Soft, ND, ATTP ?Ext: NT, No erythema ? ?Foley draining pink on slow CBI ? ?Lab Results: ?Recent Labs  ?  08/07/21 ?1603 08/08/21 ?0250 08/09/21 ?0238  ?HGB 8.6* 7.8* 8.9*  ?HCT 26.8* 23.8* 27.6*  ? ?BMET ?Recent Labs  ?  08/08/21 ?0250 08/09/21 ?0238  ?NA 138 137  ?K 3.6 3.5  ?CL 105 104  ?CO2 26 26  ?GLUCOSE 112* 114*  ?BUN 7* 7*  ?CREATININE 0.94 0.90  ?CALCIUM 7.9* 8.2*  ? ? ? ?Studies/Results: ?DG CHEST PORT 1 VIEW ? ?Result Date: 08/09/2021 ?CLINICAL DATA:  Shortness of breath. EXAM: PORTABLE CHEST 1 VIEW COMPARISON:  AP chest 08/06/2021; chest two views 07/13/2021 FINDINGS: Cardiac silhouette is again mildly enlarged. Mediastinal contours are within normal limits with mild calcification within aortic arch. Mild bilateral interstitial thickening/scarring is unchanged. No pleural effusion or pneumothorax. Mild multilevel degenerative disc changes of the thoracic spine with minimal levocurvature of the upper thoracic spine. Severe right glenohumeral osteoarthritis. IMPRESSION: Mild chronic interstitial thickening, likely mild chronic interstitial lung disease. Electronically Signed   By: Yvonne Kendall M.D.   On: 08/09/2021 17:04   ? ?Assessment/Plan: ?Joshua Alvarez is with gross hematuria prostate infection/traumatic foley insertion with enlarged prostate and obstructing median lobe.   ESBL E.coli w/ only oral option Keflex.  Now hemodynamically stable - continues to appear hypervolemic. ? ?Wean CBI as able. ?Keep on IV antibiotics until discharge - transition over to keflex. ?Hold decision for anti-coagulation until f/u with cardiology as outpatient. ?TURP scheduled for 5/18 - outpatient w/ extended recovery (23 hrs obs). ?Will keep following while in house. ? ? ? LOS: 5 days  ? ?[name] ?08/10/2021, 10:58 AM ?[facility]  ? ? ? ?

## 2021-08-11 ENCOUNTER — Inpatient Hospital Stay (HOSPITAL_COMMUNITY): Payer: Medicare HMO

## 2021-08-11 DIAGNOSIS — D62 Acute posthemorrhagic anemia: Secondary | ICD-10-CM | POA: Diagnosis not present

## 2021-08-11 LAB — COMPREHENSIVE METABOLIC PANEL
ALT: 14 U/L (ref 0–44)
AST: 21 U/L (ref 15–41)
Albumin: 2.9 g/dL — ABNORMAL LOW (ref 3.5–5.0)
Alkaline Phosphatase: 82 U/L (ref 38–126)
Anion gap: 9 (ref 5–15)
BUN: 10 mg/dL (ref 8–23)
CO2: 30 mmol/L (ref 22–32)
Calcium: 8.5 mg/dL — ABNORMAL LOW (ref 8.9–10.3)
Chloride: 98 mmol/L (ref 98–111)
Creatinine, Ser: 1.01 mg/dL (ref 0.61–1.24)
GFR, Estimated: >60 mL/min (ref >60)
Glucose, Bld: 124 mg/dL — ABNORMAL HIGH (ref 70–99)
Potassium: 3.3 mmol/L — ABNORMAL LOW (ref 3.5–5.1)
Sodium: 137 mmol/L (ref 135–145)
Total Bilirubin: 0.6 mg/dL (ref 0.3–1.2)
Total Protein: 6.2 g/dL — ABNORMAL LOW (ref 6.5–8.1)

## 2021-08-11 LAB — CBC
HCT: 30.5 % — ABNORMAL LOW (ref 39.0–52.0)
Hemoglobin: 9.9 g/dL — ABNORMAL LOW (ref 13.0–17.0)
MCH: 28 pg (ref 26.0–34.0)
MCHC: 32.5 g/dL (ref 30.0–36.0)
MCV: 86.2 fL (ref 80.0–100.0)
Platelets: 234 10*3/uL (ref 150–400)
RBC: 3.54 MIL/uL — ABNORMAL LOW (ref 4.22–5.81)
RDW: 14.7 % (ref 11.5–15.5)
WBC: 13 10*3/uL — ABNORMAL HIGH (ref 4.0–10.5)
nRBC: 0.2 % (ref 0.0–0.2)

## 2021-08-11 MED ORDER — FUROSEMIDE 10 MG/ML IJ SOLN
40.0000 mg | Freq: Once | INTRAMUSCULAR | Status: AC
  Administered 2021-08-11: 40 mg via INTRAVENOUS
  Filled 2021-08-11: qty 4

## 2021-08-11 MED ORDER — IPRATROPIUM BROMIDE 0.02 % IN SOLN
0.5000 mg | Freq: Three times a day (TID) | RESPIRATORY_TRACT | Status: DC
  Administered 2021-08-11 – 2021-08-12 (×2): 0.5 mg via RESPIRATORY_TRACT
  Filled 2021-08-11 (×2): qty 2.5

## 2021-08-11 MED ORDER — POTASSIUM CHLORIDE 20 MEQ PO PACK
20.0000 meq | PACK | Freq: Once | ORAL | Status: AC
  Administered 2021-08-11: 20 meq via ORAL
  Filled 2021-08-11: qty 1

## 2021-08-11 MED ORDER — IPRATROPIUM BROMIDE 0.02 % IN SOLN
0.5000 mg | Freq: Three times a day (TID) | RESPIRATORY_TRACT | Status: DC
  Administered 2021-08-11: 0.5 mg via RESPIRATORY_TRACT
  Filled 2021-08-11: qty 2.5

## 2021-08-11 MED ORDER — GUAIFENESIN ER 600 MG PO TB12
1200.0000 mg | ORAL_TABLET | Freq: Two times a day (BID) | ORAL | Status: DC
  Administered 2021-08-11 – 2021-08-14 (×7): 1200 mg via ORAL
  Filled 2021-08-11 (×7): qty 2

## 2021-08-11 MED ORDER — POTASSIUM CHLORIDE 20 MEQ PO PACK
40.0000 meq | PACK | Freq: Two times a day (BID) | ORAL | Status: AC
  Administered 2021-08-11 (×2): 40 meq via ORAL
  Filled 2021-08-11 (×2): qty 2

## 2021-08-11 NOTE — Evaluation (Signed)
Physical Therapy Evaluation ?Patient Details ?Name: Joshua Alvarez ?MRN: [id number] ?DOB: 05/14/1945 ?Today's Date: 08/11/2021 ? ?History of Present Illness ? 76 year old with past medical history significant for bladder spasm, prostatic hypertrophy, kidney stone, hypertension, paroxysmal A-fib on Eliquis, skin cancer, arthritis presented to [facility] with complaining of urinary obstruction. Dx of hemoraghhic and septic shock, ABLA 2* traumatic foley insertion, respiratory failure.  ?Clinical Impression ? Pt admitted with above diagnosis. Pt ambulated 240' with RW, SaO2 95% on room air, 2/4 dyspnea, no loss of balance. At baseline pt ambulates without an assistive device. Pt currently with functional limitations due to the deficits listed below (see PT Problem List). Pt will benefit from skilled PT to increase their independence and safety with mobility to allow discharge to the venue listed below.   ?   ?   ? ?Recommendations for follow up therapy are one component of a multi-disciplinary discharge planning process, led by the attending physician.  Recommendations may be updated based on patient status, additional functional criteria and insurance authorization. ? ?Follow Up Recommendations No PT follow up ? ?  ?Assistance Recommended at Discharge Set up Supervision/Assistance  ?Patient can return home with the following ? Assistance with cooking/housework ? ?  ?Equipment Recommendations Rolling walker (2 wheels)  ?Recommendations for Other Services ?    ?  ?Functional Status Assessment Patient has had a recent decline in their functional status and demonstrates the ability to make significant improvements in function in a reasonable and predictable amount of time.  ? ?  ?Precautions / Restrictions Precautions ?Precautions: Other (comment) ?Precaution Comments: bladder irrigation, keep drainage bag below bladder ?Restrictions ?Weight Bearing Restrictions: No  ? ?  ? ?Mobility ? Bed Mobility ?Overal bed mobility:  Modified Independent ?  ?  ?  ?  ?  ?  ?General bed mobility comments: HOB up, used rail ?  ? ?Transfers ?Overall transfer level: Needs assistance ?Equipment used: Rolling walker (2 wheels) ?Transfers: Sit to/from Stand ?Sit to Stand: Supervision ?  ?  ?  ?  ?  ?General transfer comment: VCs hand placement ?  ? ?Ambulation/Gait ?Ambulation/Gait assistance: Supervision ?Gait Distance (Feet): 240 Feet ?Assistive device: Rolling walker (2 wheels) ?Gait Pattern/deviations: WFL(Within Functional Limits) ?Gait velocity: WFL ?  ?  ?General Gait Details: steady with RW, no loss of balance, SaO2 95% on room air, 2/4 dyspnea ? ?Stairs ?  ?  ?  ?  ?  ? ?Wheelchair Mobility ?  ? ?Modified Rankin (Stroke Patients Only) ?  ? ?  ? ?Balance Overall balance assessment: Modified Independent ?  ?  ?  ?  ?  ?  ?  ?  ?  ?  ?  ?  ?  ?  ?  ?  ?  ?  ?   ? ? ? ?Pertinent Vitals/Pain Pain Assessment ?Pain Assessment: No/denies pain  ? ? ?Home Living Family/patient expects to be discharged to:: Private residence ?Living Arrangements: Spouse/significant other ?Available Help at Discharge: Family;Available 24 hours/day ?Type of Home: House ?Home Access: Stairs to enter ?Entrance Stairs-Rails: Left ?Entrance Stairs-Number of Steps: 8 ?  ?Home Layout: Two level;Able to live on main level with bedroom/bathroom ?Home Equipment: Kasandra Knudsen - single point ?   ?  ?Prior Function Prior Level of Function : Independent/Modified Independent ?  ?  ?  ?  ?  ?  ?Mobility Comments: walks without AD, no falls in 6 months ?ADLs Comments: independent ?  ? ? ?Hand Dominance  ?   ? ?  ?  Extremity/Trunk Assessment  ? Upper Extremity Assessment ?Upper Extremity Assessment: Overall WFL for tasks assessed ?  ? ?Lower Extremity Assessment ?Lower Extremity Assessment: Overall WFL for tasks assessed ?  ? ?Cervical / Trunk Assessment ?Cervical / Trunk Assessment: Normal  ?Communication  ? Communication: HOH  ?Cognition Arousal/Alertness: Awake/alert ?Behavior During Therapy: [facility]  for tasks assessed/performed ?Overall Cognitive Status: Within Functional Limits for tasks assessed ?  ?  ?  ?  ?  ?  ?  ?  ?  ?  ?  ?  ?  ?  ?  ?  ?  ?  ?  ? ?  ?General Comments   ? ?  ?Exercises    ? ?Assessment/Plan  ?  ?PT Assessment Patient needs continued PT services  ?PT Problem List Decreased activity tolerance;Decreased knowledge of use of DME ? ?   ?  ?PT Treatment Interventions Gait training;Therapeutic exercise   ? ?PT Goals (Current goals can be found in the Care Plan section)  ?Acute Rehab PT Goals ?Patient Stated Goal: get moving more ?PT Goal Formulation: With patient/family ?Time For Goal Achievement: 08/25/21 ?Potential to Achieve Goals: Good ? ?  ?Frequency Min 3X/week ?  ? ? ?Co-evaluation   ?  ?  ?  ?  ? ? ?  ?AM-PAC PT "6 Clicks" Mobility  ?Outcome Measure Help needed turning from your back to your side while in a flat bed without using bedrails?: None ?Help needed moving from lying on your back to sitting on the side of a flat bed without using bedrails?: A Little ?Help needed moving to and from a bed to a chair (including a wheelchair)?: None ?Help needed standing up from a chair using your arms (e.g., wheelchair or bedside chair)?: None ?Help needed to walk in hospital room?: None ?Help needed climbing 3-5 steps with a railing? : A Little ?6 Click Score: 22 ? ?  ?End of Session Equipment Utilized During Treatment: Gait belt ?Activity Tolerance: Patient tolerated treatment well ?Patient left: in bed;with bed alarm set;with family/visitor present;with call bell/phone within reach ?Nurse Communication: Mobility status ?PT Visit Diagnosis: Difficulty in walking, not elsewhere classified (R26.2) ?  ? ?Time: 4599-7741 ?PT Time Calculation (min) (ACUTE ONLY): 20 min ? ? ?Charges:   PT Evaluation ?$PT Eval Moderate Complexity: 1 Mod ?  ?  ?   ? ?Blondell Reveal Kistler PT 08/11/2021  ?Acute Rehabilitation Services ?Pager [phone] ?Office [phone] ? ? ?

## 2021-08-11 NOTE — Progress Notes (Signed)
?PROGRESS NOTE ? ? ? ?Angle Karel Salido  TIR:[id number] DOB: 01/13/1946 DOA: 08/05/2021 ?PCP: Coral Spikes, DO  ? ?Brief Narrative: ?76 year old with past medical history significant for bladder spasm, prostatic hypertrophy, kidney stone, hypertension, paroxysmal A-fib on Eliquis, skin cancer, arthritis presented to [facility] with complaining of urinary obstruction.  Patient was seen at this facility night prior with exchange of Foley catheter due to the same complaints.  Patient Foley catheter was exchanged in the ED with good return of urine seen and patient was discharged from the emergency department.  He presented with recurrent obstructive symptoms.  Patient had significant bleeding from urethra with inability to place Foley catheter.  Urology consulted and placed the catheter successfully.  Patient blood pressure dropped requiring IV pressors.  He was admitted to the ICU for hemorrhagic and septic shock.  Blood cultures positive for E. coli.  He was treated initially with ceftriaxone. ? ?[facility] assumed care on 08/27/2021. ?He has required IV Lasix for anasarca and pulmonary edema. ?Last night he became more confused, per wife his oxygen dropped to the 40s, he was off of oxygen at that time.  After he was placed on oxygen his oxygen sats recover. ? ?Repeated chest x-ray 5/4 showed  mild worsening pulmonary edema. ? ? ? ?Assessment & Plan: ?  ?Principal Problem: ?  Acute blood loss anemia ? ?1-Hemorrhagic and septic shock secondary to acute blood loss anemia from traumatic Foley placement and E. coli bacteremia: ?-Patient was initially treated with IV pressors. ?-He received 2 unit of packed red blood cell. ?-Hb  has remained stable, hematuria is clearing ?-Continue with Ancef.  He will need 2 weeks of treatment ? ?2-Acute hypoxic respiratory failure, secondary to pulmonary edema, related to volume overload: ?-He has received IV Lasix during hospitalization. ?-Chest x ray 5/04: Mild worsening of diffuse pulmonary  edema. ?-Repeat IV Lasix today. ?-He Is back on 4 L of oxygen, after episode overnight of hypoxemia ?Schedule nebulizer treatments, start Guaifenesin.  ? ? ?3-Acute metabolic encephalopathy: In the setting of hypoxemia, hospital delirium. ?-Continue with oxygen supplementation ?-Delirium precaution ? ?4-Hematuria with nondraining Foley catheter: ?History of BPH with urinary retention ?Urology following patient will require TURP procedure scheduled for 5/18 ?Continue with irrigation ? ?AKI: In the setting of sepsis and shock ?Function has improved ? ?A-fib with RVR, paroxysmal A-fib ?Hypertension hyperlipidemia ?He converted to sinus rhythm cardiology recommending amiodarone 400 mg twice daily for 7 days and then 200 mg daily for 21 days and then stop. ?Anticoagulation will need to be evaluated as an outpatient after patient undergo a TURP procedure ? ?Bladder spasm: On oxybutynin 3 times daily ? ?Lactic acidosis in the setting of hemorrhagic/septic shock ?Lactic acid peaked to 3.9. ?Lactic acidosis resolved ? ? ? ? ?Estimated body mass index is 28.8 kg/m? as calculated from the following: ?  Height as of this encounter: '5\' 7"'$  (1.702 m). ?  Weight as of this encounter: 83.4 kg. ? ? ?DVT prophylaxis: SCDs ?Code Status: Full code ?Family Communication: Wife updated at bedside ?Disposition Plan:  ?Status is: Inpatient ?Remains inpatient appropriate because: Management of hypoxemia, confusion, pulmonary edema hematuria.  Will request PT OT eval ? ? ? ?Consultants:  ?Cardiology ?Urology  ? ?Procedures:  ? ? ?Antimicrobials:  ? ? ?Subjective: ?He was sleepy.  Patient could not sleep well last night.  He was confused, per wife his oxygen dropped to 40 last night, he was placed back on oxygen right away his oxygen recover.  He was  placed on 6 L.  He got out of the bed and started to going out he was confused. ?He reports that he is breathing a little bit better now than last night.  He does not remember that he was confused.   He was able to tell me that he was in the hospital, he recognize his wife.  He reports cough with thick phlegm ? ?Objective: ?Vitals:  ? 08/10/21 2200 08/11/21 0500 08/11/21 0501 08/11/21 0930  ?BP: 137/74  (!) 154/95   ?Pulse: 72  70   ?Resp: 18  20   ?Temp: 97.9 ?F (36.6 ?C)  97.6 ?F (36.4 ?C)   ?TempSrc: Oral  Oral   ?SpO2: 99%  100% 98%  ?Weight:  83.4 kg    ?Height:      ? ? ?Intake/Output Summary (Last 24 hours) at 08/11/2021 1132 ?Last data filed at 08/11/2021 1124 ?Gross per 24 hour  ?Intake 9780 ml  ?Output 20975 ml  ?Net -11195 ml  ? ?Filed Weights  ? 08/09/21 0500 08/10/21 0500 08/11/21 0500  ?Weight: 80.5 kg 80.6 kg 83.4 kg  ? ? ?Examination: ? ?General exam: Appears calm and comfortable  ?Respiratory system: BL crackles, tachypnea.  ?Cardiovascular system: S1 & S2 heard, RRR.  ?Gastrointestinal system: Abdomen is nondistended, soft and nontender. No organomegaly or masses felt. Normal bowel sounds heard. ?Central nervous system: Alert and oriented. No focal neurological deficits. ?Extremities: Symmetric 5 x 5 power. ?Skin: No rashes, lesions or ulcers ? ? ? ?Data Reviewed: I have personally reviewed following labs and imaging studies ? ?CBC: ?Recent Labs  ?Lab 08/05/21 ?0828 08/05/21 ?1107 08/07/21 ?0998 08/07/21 ?1603 08/08/21 ?0250 08/09/21 ?3382 08/11/21 ?5053  ?WBC 17.7*   < > 15.0* 14.3* 11.8* 11.2* 13.0*  ?NEUTROABS 15.2*  --   --   --   --   --   --   ?HGB 13.1   < > 7.9* 8.6* 7.8* 8.9* 9.9*  ?HCT 39.3   < > 24.3* 26.8* 23.8* 27.6* 30.5*  ?MCV 82.9   < > 82.9 84.8 83.5 85.4 86.2  ?PLT 355   < > 168 170 174 213 234  ? < > = values in this interval not displayed.  ? ?Basic Metabolic Panel: ?Recent Labs  ?Lab 08/06/21 ?9767 08/07/21 ?0243 08/08/21 ?0250 08/09/21 ?3419 08/10/21 ?1257 08/11/21 ?0414  ?NA 138 137 138 137 136 137  ?K 4.3 3.4* 3.6 3.5 3.3* 3.3*  ?CL 108 109 105 104 96* 98  ?CO2 '25 24 26 26 29 30  '$ ?GLUCOSE 127* 112* 112* 114* 127* 124*  ?BUN 19 13 7* 7* 9 10  ?CREATININE 1.25* 1.00 0.94 0.90  1.00 1.01  ?CALCIUM 8.2* 7.7* 7.9* 8.2* 8.6* 8.5*  ?MG 2.1 1.8  --  1.9  --   --   ?PHOS 2.5  --   --  3.1  --   --   ? ?GFR: ?Estimated Creatinine Clearance: 65.3 mL/min (by C-G formula based on SCr of 1.01 mg/dL). ?Liver Function Tests: ?Recent Labs  ?Lab 08/09/21 ?0238 08/10/21 ?1257 08/11/21 ?0414  ?AST '15 27 21  '$ ?ALT '13 18 14  '$ ?ALKPHOS 66 95 82  ?BILITOT 0.7 0.8 0.6  ?PROT 5.5* 6.8 6.2*  ?ALBUMIN 2.7* 3.2* 2.9*  ? ?No results for input(s): LIPASE, AMYLASE in the last 168 hours. ?No results for input(s): AMMONIA in the last 168 hours. ?Coagulation Profile: ?No results for input(s): INR, PROTIME in the last 168 hours. ?Cardiac Enzymes: ?No results for input(s): CKTOTAL, CKMB,  CKMBINDEX, TROPONINI in the last 168 hours. ?BNP (last 3 results) ?No results for input(s): PROBNP in the last 8760 hours. ?HbA1C: ?No results for input(s): HGBA1C in the last 72 hours. ?CBG: ?No results for input(s): GLUCAP in the last 168 hours. ?Lipid Profile: ?No results for input(s): CHOL, HDL, LDLCALC, TRIG, CHOLHDL, LDLDIRECT in the last 72 hours. ?Thyroid Function Tests: ?No results for input(s): TSH, T4TOTAL, FREET4, T3FREE, THYROIDAB in the last 72 hours. ?Anemia Panel: ?No results for input(s): VITAMINB12, FOLATE, FERRITIN, TIBC, IRON, RETICCTPCT in the last 72 hours. ?Sepsis Labs: ?Recent Labs  ?Lab 08/05/21 ?1610 08/05/21 ?1825 08/06/21 ?0002 08/06/21 ?5465 08/06/21 ?6812 08/07/21 ?7517  ?PROCALCITON <0.10  --   --   --  8.74 5.38  ?LATICACIDVEN 1.6 3.9* 3.3* 2.8*  --   --   ? ? ?Recent Results (from the past 240 hour(s))  ?Culture, blood (Routine X 2) w Reflex to ID Panel     Status: Abnormal  ? Collection Time: 08/05/21  4:10 PM  ? Specimen: BLOOD  ?Result Value Ref Range Status  ? Specimen Description   Final  ?  BLOOD ?Performed at [facility], [place] [address] [place], [place] [address] ?  ? Special Requests   Final  ?  BOTTLES DRAWN AEROBIC AND ANAEROBIC Blood Culture results may not be optimal due to an  inadequate volume of blood received in culture bottles ?Performed at [facility], [place] [address], [place], [place] [address] ?  ? Culture  Setup Time   Final  ?  GRAM NEGATIVE R

## 2021-08-12 DIAGNOSIS — D62 Acute posthemorrhagic anemia: Secondary | ICD-10-CM | POA: Diagnosis not present

## 2021-08-12 LAB — BASIC METABOLIC PANEL
Anion gap: 8 (ref 5–15)
BUN: 11 mg/dL (ref 8–23)
CO2: 31 mmol/L (ref 22–32)
Calcium: 8.5 mg/dL — ABNORMAL LOW (ref 8.9–10.3)
Chloride: 97 mmol/L — ABNORMAL LOW (ref 98–111)
Creatinine, Ser: 1.02 mg/dL (ref 0.61–1.24)
GFR, Estimated: >60 mL/min (ref >60)
Glucose, Bld: 116 mg/dL — ABNORMAL HIGH (ref 70–99)
Potassium: 4 mmol/L (ref 3.5–5.1)
Sodium: 136 mmol/L (ref 135–145)

## 2021-08-12 LAB — CBC
HCT: 30.7 % — ABNORMAL LOW (ref 39.0–52.0)
Hemoglobin: 9.9 g/dL — ABNORMAL LOW (ref 13.0–17.0)
MCH: 27.7 pg (ref 26.0–34.0)
MCHC: 32.2 g/dL (ref 30.0–36.0)
MCV: 85.8 fL (ref 80.0–100.0)
Platelets: 289 10*3/uL (ref 150–400)
RBC: 3.58 MIL/uL — ABNORMAL LOW (ref 4.22–5.81)
RDW: 14.9 % (ref 11.5–15.5)
WBC: 12.3 10*3/uL — ABNORMAL HIGH (ref 4.0–10.5)
nRBC: 0 % (ref 0.0–0.2)

## 2021-08-12 MED ORDER — IPRATROPIUM BROMIDE 0.02 % IN SOLN
0.5000 mg | Freq: Two times a day (BID) | RESPIRATORY_TRACT | Status: DC
  Administered 2021-08-12 – 2021-08-14 (×4): 0.5 mg via RESPIRATORY_TRACT
  Filled 2021-08-12 (×4): qty 2.5

## 2021-08-12 MED ORDER — POTASSIUM CHLORIDE CRYS ER 20 MEQ PO TBCR
40.0000 meq | EXTENDED_RELEASE_TABLET | Freq: Once | ORAL | Status: AC
Start: 2021-08-12 — End: 2021-08-12
  Administered 2021-08-12: 40 meq via ORAL
  Filled 2021-08-12: qty 2

## 2021-08-12 MED ORDER — FUROSEMIDE 10 MG/ML IJ SOLN
40.0000 mg | Freq: Two times a day (BID) | INTRAMUSCULAR | Status: AC
  Administered 2021-08-12 (×2): 40 mg via INTRAVENOUS
  Filled 2021-08-12 (×2): qty 4

## 2021-08-12 MED ORDER — LEVALBUTEROL HCL 0.63 MG/3ML IN NEBU
0.6300 mg | INHALATION_SOLUTION | Freq: Two times a day (BID) | RESPIRATORY_TRACT | Status: DC
  Administered 2021-08-12 – 2021-08-14 (×4): 0.63 mg via RESPIRATORY_TRACT
  Filled 2021-08-12 (×4): qty 3

## 2021-08-12 NOTE — Evaluation (Signed)
Occupational Therapy Evaluation ?Patient Details ?Name: Joshua Alvarez ?MRN: [id number] ?DOB: 12-02-1945 ?Today's Date: 08/12/2021 ? ? ?History of Present Illness 76 year old with past medical history significant for bladder spasm, prostatic hypertrophy, kidney stone, hypertension, paroxysmal A-fib on Eliquis, skin cancer, arthritis presented to [facility] with complaining of urinary obstruction. Dx of hemoraghhic and septic shock, ABLA 2* traumatic foley insertion, respiratory failure.  ? ?Clinical Impression ?  ?Joshua Alvarez is a 76 year old man who demonstrates the ability to perform bed mobility, ADLs and in room ambulation independently. He reports no weakness or fatigue. Patient has no current OT needs.  ?   ? ?Recommendations for follow up therapy are one component of a multi-disciplinary discharge planning process, led by the attending physician.  Recommendations may be updated based on patient status, additional functional criteria and insurance authorization.  ? ?Follow Up Recommendations ? No OT follow up  ?  ?Assistance Recommended at Discharge None  ?Patient can return home with the following   ? ?  ?Functional Status Assessment ? Patient has not had a recent decline in their functional status  ?Equipment Recommendations ?    ?  ?Recommendations for Other Services   ? ? ?  ?Precautions / Restrictions Precautions ?Precautions: None ?Precaution Comments: foley ?Restrictions ?Weight Bearing Restrictions: No  ? ?  ? ?Mobility Bed Mobility ?Overal bed mobility: Independent ?  ?  ?  ?  ?  ?  ?  ?  ? ?Transfers ?Overall transfer level: Independent ?  ?  ?  ?  ?  ?  ?  ?  ?General transfer comment: Independent in room without a device. REports independence going to bathroom and ambulating in hall. ?  ? ?  ?Balance Overall balance assessment: No apparent balance deficits (not formally assessed) ?  ?  ?  ?  ?  ?  ?  ?  ?  ?  ?  ?  ?  ?  ?  ?  ?  ?  ?   ? ?ADL either performed or assessed with clinical  judgement  ? ?ADL Overall ADL's : Independent ?  ?  ?  ?  ?  ?  ?  ?  ?  ?  ?  ?  ?  ?  ?  ?  ?  ?  ?  ?   ? ? ? ?Vision Baseline Vision/History: 1 Wears glasses ?   ?   ?Perception   ?  ?Praxis   ?  ? ?Pertinent Vitals/Pain    ? ? ? ?Hand Dominance Right ?  ?Extremity/Trunk Assessment Upper Extremity Assessment ?Upper Extremity Assessment: RUE deficits/detail;LUE deficits/detail ?RUE Deficits / Details: WFL ROM, 5/5 strength ?RUE Sensation: WNL ?RUE Coordination: WNL ?LUE Deficits / Details: WFL ROM, 5/5 strength ?  ?Lower Extremity Assessment ?Lower Extremity Assessment: Overall WFL for tasks assessed ?  ?Cervical / Trunk Assessment ?Cervical / Trunk Assessment: Normal ?  ?Communication Communication ?Communication: HOH ?  ?Cognition Arousal/Alertness: Awake/alert ?Behavior During Therapy: [facility] for tasks assessed/performed ?Overall Cognitive Status: Within Functional Limits for tasks assessed ?  ?  ?  ?  ?  ?  ?  ?  ?  ?  ?  ?  ?  ?  ?  ?  ?  ?  ?  ?General Comments    ? ?  ?Exercises   ?  ?Shoulder Instructions    ? ? ?Home Living Family/patient expects to be discharged to:: Private residence ?Living Arrangements: Spouse/significant other ?  Available Help at Discharge: Family;Available 24 hours/day ?Type of Home: House ?Home Access: Stairs to enter ?Entrance Stairs-Number of Steps: 8 ?Entrance Stairs-Rails: Left ?Home Layout: Two level;Able to live on main level with bedroom/bathroom ?  ?  ?Bathroom Shower/Tub: Walk-in shower ?  ?Bathroom Toilet: Standard ?  ?  ?Home Equipment: Kasandra Knudsen - single point;Shower seat ?  ?  ?  ? ?  ?Prior Functioning/Environment Prior Level of Function : Independent/Modified Independent ?  ?  ?  ?  ?  ?  ?Mobility Comments: walks without AD, no falls in 6 months ?ADLs Comments: independent ?  ? ?  ?  ?OT Problem List:   ?  ?   ?OT Treatment/Interventions:    ?  ?OT Goals(Current goals can be found in the care plan section) Acute Rehab OT Goals ?OT Goal Formulation: All assessment and  education complete, DC therapy  ?OT Frequency:   ?  ? ?Co-evaluation   ?  ?  ?  ?  ? ?  ?AM-PAC OT "6 Clicks" Daily Activity     ?Outcome Measure Help from another person eating meals?: None ?Help from another person taking care of personal grooming?: None ?Help from another person toileting, which includes using toliet, bedpan, or urinal?: None ?Help from another person bathing (including washing, rinsing, drying)?: None ?Help from another person to put on and taking off regular upper body clothing?: None ?Help from another person to put on and taking off regular lower body clothing?: None ?6 Click Score: 24 ?  ?End of Session   ? ?Activity Tolerance: Patient tolerated treatment well ?Patient left: in bed;with call bell/phone within reach ? ?OT Visit Diagnosis: Muscle weakness (generalized) (M62.81)  ?              ?Time: 1610-9604 ?OT Time Calculation (min): 8 min ?Charges:  OT General Charges ?$OT Visit: 1 Visit ?OT Evaluation ?$OT Eval Low Complexity: 1 Low ? ?Joshua Alvarez, OTR/L ?Acute Care Rehab Services  ?Office [phone] ?Pager: [phone]  ? ?Joshua Alvarez ?08/12/2021, 1:32 PM ?

## 2021-08-12 NOTE — Care Management Important Message (Signed)
Important Message ? ?Patient Details IM Letter given to the Patient. ?Name: Joshua Alvarez ?MRN: [id number] ?Date of Birth: 03-Aug-1945 ? ? ?Medicare Important Message Given:  Yes ? ? ? ? ?Kerin Salen ?08/12/2021, 11:20 AM ?

## 2021-08-12 NOTE — Progress Notes (Signed)
SATURATION QUALIFICATIONS: (This note is used to comply with regulatory documentation for home oxygen) ? ?Patient Saturations on Room Air at Rest = 93% ? ?Patient Saturations on Room Air while Ambulating = 91% ? ?Patient Saturations on 0 Liters of oxygen while Ambulating = 91% ? ?Please briefly explain why patient needs home oxygen:  Patient ambulated 360 feet on RA, oxygen saturation remained at above 90% while ambulating. ?

## 2021-08-12 NOTE — Progress Notes (Signed)
?  Interval/Subjective: ?Several [name] clots overnight - likely old clot/residual ?Urine clear on gentle CBI ? ? ?Objective: ?Vital signs in last 24 hours: ?Temp:  [98.2 ?F (36.8 ?C)-98.6 ?F (37 ?C)] 98.6 ?F (37 ?C) (05/05 0458) ?Pulse Rate:  [60-65] 65 (05/05 0458) ?Resp:  [18-20] 19 (05/05 0458) ?BP: (121-133)/(65-84) 133/76 (05/05 0458) ?SpO2:  [95 %-100 %] 98 % (05/05 0745) ?Weight:  [80.8 kg] 80.8 kg (05/05 0500) ? ?Intake/Output from previous day: ?05/04 0701 - 05/05 0700 ?In: 42650.3 [P.O.:470; IV Piggyback:180.3] ?Out: [phone] [Urine:48850] ?Intake/Output this shift: ?Total I/O ?In: 6000 [Other:6000] ?Out: 3700 [Urine:3700] ? ?Physical Exam:  ?General: Alert and oriented ?CV: REgular ?Lungs: wheezes ?Abdomen: Soft, ND, ATTP ?Ext: NT, No erythema ? ?Foley draining pink on slow CBI ? ?Lab Results: ?Recent Labs  ?  08/11/21 ?6440 08/12/21 ?3474  ?HGB 9.9* 9.9*  ?HCT 30.5* 30.7*  ? ?BMET ?Recent Labs  ?  08/11/21 ?0414 08/12/21 ?2595  ?NA 137 136  ?K 3.3* 4.0  ?CL 98 97*  ?CO2 30 31  ?GLUCOSE 124* 116*  ?BUN 10 11  ?CREATININE 1.01 1.02  ?CALCIUM 8.5* 8.5*  ? ? ? ?Studies/Results: ?DG CHEST PORT 1 VIEW ? ?Result Date: 08/11/2021 ?CLINICAL DATA:  Dyspnea. EXAM: PORTABLE CHEST 1 VIEW COMPARISON:  08/09/2021 FINDINGS: Mild-to-moderate cardiomegaly remains stable. Diffuse interstitial infiltrates are again seen with increased airspace opacity in both lower lungs, consistent with increased edema. No significant pleural fluid seen. IMPRESSION: Mild worsening of diffuse pulmonary edema. Stable cardiomegaly. Electronically Signed   By: Marlaine Hind M.D.   On: 08/11/2021 12:22   ? ?Assessment/Plan: ?Joshua Alvarez is with gross hematuria prostate infection/traumatic foley insertion with enlarged prostate and obstructing median lobe.   ESBL E.coli w/ only oral option Keflex.  Now hemodynamically stable - continues to appear hypervolemic. ? ?CBI stopped this AM, foley capped.  We will tolerated some pink urine. ?Would be  ideal to keep patient on abx until his surgery which May 19. ?Ready for discharge from my prospective. ? ?Thanks! ? ? ? LOS: 7 days  ? ?[name] ?08/12/2021, 9:19 AM ?[facility]  ? ? ? ?

## 2021-08-12 NOTE — Evaluation (Signed)
Clinical/Bedside Swallow Evaluation ?Patient Details  ?Name: Joshua Alvarez ?MRN: [id number] ?Date of Birth: Feb 12, 1946 ? ?Today's Date: 08/12/2021 ?Time: SLP Start Time (ACUTE ONLY): 1225 SLP Stop Time (ACUTE ONLY): 1302 ?SLP Time Calculation (min) (ACUTE ONLY): 37 min ? ?Past Medical History:  ?Past Medical History:  ?Diagnosis Date  ? Allergic rhinitis   ? Arthritis   ? Bladder spasms   ? Colon polyp   ? History of kidney stones   ? x 3   ? Hypertension   ? Migraine headache   ? Paroxysmal atrial fibrillation (HCC)   ? Diagnosed April 2023  ? Skin cancer   ? Urgency of urination   ? ?Past Surgical History:  ?Past Surgical History:  ?Procedure Laterality Date  ? COLONOSCOPY    ? COLONOSCOPY N/A 02/11/2014  ? Procedure: COLONOSCOPY;  Surgeon: Joshua Houston, MD;  [facility];  Service: Endoscopy;  Laterality: N/A;  730  ? CYSTOSCOPY    ? x 2 - stone removal  ? FINGER SURGERY Right   ? right 5th finger  ? INGUINAL HERNIA REPAIR Bilateral   ? INSERTION OF MESH Left 05/04/2020  ? Procedure: INSERTION OF MESH;  Surgeon: Joshua Ok, MD;  Location: [facility];  Service: General;  Laterality: Left;  ? RIGHT KNEE REPAIRED    ? XI ROBOTIC ASSISTED INGUINAL HERNIA REPAIR WITH MESH Left 05/04/2020  ? Procedure: XI ROBOTIC ASSISTED LEFT INGUINAL HERNIA REPAIR WITH MESH;  Surgeon: Joshua Ok, MD;  Location: [facility];  Service: General;  Laterality: Left;  ? ?HPI:  ?76 year old with past medical history significant for bladder spasm, prostatic hypertrophy, kidney stone, hypertension, paroxysmal A-fib on Eliquis, skin cancer, arthritis presented to [facility] with complaining of urinary obstruction. Dx of hemoraghhic and septic shock, ABLA 2* traumatic foley insertion, respiratory failure.   Pt denies any reflux issues.  ?  ?Assessment / Plan / Recommendation  ?Clinical Impression ? Pt is very xerostomic with erythremic oral cavity - ? Potential mild edema of uvula.  He easily passed 3 ounce Yale water challenge and  was able to feed himself sandwich and water.  Swallowing was clinically judged to be timely with cough x1 only at completion of snack.  Pt denies coughing associated with po intake - but suspect his reflexive cough from current illness may impair his swallow/respiratory reciprocity.  He does report coughing causing him to gag at times.  Recommend return to prior diet with pt starting all intake with liquids due to level of xerostomia.  Pt's voice is dysphonic with impaired phonatory strength - he reports this to be present over the last few months with a gradual onset.   Advised pt monitor voice given remote h/o smoking  - seen ENT as OP if needed.   All education completed using teach back. ?SLP Visit Diagnosis: Dysphagia, unspecified (R13.10) ?   ?Aspiration Risk ? Mild aspiration risk  ?  ?Diet Recommendation Regular;Thin liquid  ? ?Liquid Administration via: Cup;Straw ?Medication Administration: Whole meds with liquid ?Supervision: Patient able to self feed ?Compensations: Slow rate;Bisceglia sips/bites (assure rest breaks taken if dyspneic; start all intake with liquids) ?Postural Changes: Seated upright at 90 degrees;Remain upright for at least 30 minutes after po intake  ?  ?Other  Recommendations Oral Care Recommendations: Oral care BID ?Other Recommendations: Clarify dietary restrictions   ? ?Recommendations for follow up therapy are one component of a multi-disciplinary discharge planning process, led by the attending physician.  Recommendations may be updated based on  patient status, additional functional criteria and insurance authorization. ? ?Follow up Recommendations No SLP follow up  ? ? ?  ?Assistance Recommended at Discharge None  ?Functional Status Assessment Patient has not had a recent decline in their functional status  ?Frequency and Duration   N/a ?  ?  ?   ? ?Prognosis    ?N/a ?  ? ?Swallow Study   ?General Date of Onset: 08/12/21 ?HPI: 76 year old with past medical history significant for  bladder spasm, prostatic hypertrophy, kidney stone, hypertension, paroxysmal A-fib on Eliquis, skin cancer, arthritis presented to [facility] with complaining of urinary obstruction. Dx of hemoraghhic and septic shock, ABLA 2* traumatic foley insertion, respiratory failure. ?Type of Study: Bedside Swallow Evaluation ?Previous Swallow Assessment: none ?Diet Prior to this Study: NPO ?Temperature Spikes Noted: No ?Respiratory Status: Room air ?History of Recent Intubation: No ?Behavior/Cognition: Alert;Cooperative;Pleasant mood ?Oral Cavity Assessment: Erythema ?Oral Care Completed by SLP: No ?Oral Cavity - Dentition: Adequate natural dentition ?Vision: Functional for self-feeding ?Self-Feeding Abilities: Able to feed self ?Patient Positioning: Upright in bed ?Baseline Vocal Quality: Low vocal intensity;Hoarse ?Volitional Cough: Weak;Other (Comment) (nonproductive, dry, causes facial redness) ?Volitional Swallow: Able to elicit  ?  ?Oral/Motor/Sensory Function Overall Oral Motor/Sensory Function:  (? minimal left facial asymmetry - pt reports baseline - has h/o skin cancer impacting left anterior cranial area)   ?Ice Chips Ice chips: Not tested   ?Thin Liquid Thin Liquid: Within functional limits ?Presentation: Cup ?Other Comments: Yale 3 ounce screen easily passed  ?  ?Nectar Thick Nectar Thick Liquid: Not tested   ?Honey Thick Honey Thick Liquid: Not tested   ?Puree Puree: Not tested   ?Solid ? ? ?  Solid: Impaired ?Presentation: Self Fed ?Oral Phase Functional Implications: Oral residue ?Other Comments: sandwich -minimal oral retention of bread on uvula - without pt awareness, cleared with more liquids  ? ?  ? ?Joshua Alvarez ?08/12/2021,1:44 PM ? ? ?Joshua Lime, MS CCC SLP ?Acute Rehab Services ?Office [phone] ?Pager [phone] ? ?

## 2021-08-12 NOTE — Progress Notes (Signed)
Bedside swallow evaluation completed, full report to follow.  Pt is very xerostomic with erythremic oral cavity - ? Potential mild edema of uvula.  He easily passed 3 ounce Yale water challenge and was able to feed himself sandwich and water.  Swallowing was clinically judged to be timely with cough x1 only at completion of snack.  Pt denies coughing associated with po intake - but suspect his reflexive cough from current illness may impair his swallow/respiratory reciprocity.  Recommend return to prior diet with pt starting all intake with liquids due to level of xerostomia.  Pt's voice is dysphonic with impaired phonatory strength - he reports this to be present over the last few months with a gradual onset.   Advised pt monitor voice given remote h/o smoking  - seen ENT as OP if needed.   All education completed using teach back.   ? ? ?Kathleen Lime, MS CCC SLP ?Acute Rehab Services ?Office [phone] ?Pager [phone] ? ?

## 2021-08-12 NOTE — Progress Notes (Signed)
?PROGRESS NOTE ? ? ? ?Joshua Alvarez  RJJ:[id number] DOB: 1945/07/23 DOA: 08/05/2021 ?PCP: Coral Spikes, DO  ? ?Brief Narrative: ?76 year old with past medical history significant for bladder spasm, prostatic hypertrophy, kidney stone, hypertension, paroxysmal A-fib on Eliquis, skin cancer, arthritis presented to [facility] with complaining of urinary obstruction.  Patient was seen at this facility night prior with exchange of Foley catheter due to the same complaints.  Patient Foley catheter was exchanged in the ED with good return of urine seen and patient was discharged from the emergency department.  He presented with recurrent obstructive symptoms.  Patient had significant bleeding from urethra with inability to place Foley catheter.  Urology consulted and placed the catheter successfully.  Patient blood pressure dropped requiring IV pressors.  He was admitted to the ICU for hemorrhagic and septic shock.  Blood cultures positive for E. coli.  He was treated initially with ceftriaxone. ? ?[facility] assumed care on 08/27/2021. ?He has required IV Lasix for anasarca and pulmonary edema. ?Last night he became more confused, per wife his oxygen dropped to the 40s, he was off of oxygen at that time.  After he was placed on oxygen his oxygen sats recover. ? ?Repeated chest x-ray 5/4 showed  mild worsening pulmonary edema. ? ? ? ?Assessment & Plan: ?  ?Principal Problem: ?  Acute blood loss anemia ? ?1-Hemorrhagic and septic shock secondary to acute blood loss anemia from traumatic Foley placement and E. coli bacteremia: ?-Patient was initially treated with IV pressors. ?-He received 2 unit of packed red blood cell. ?-Hb  has remained stable.  ?-Continue with Ancef.  He will need 2 weeks of treatment. ?-WBC trending down.  ?CBI stop by urology. Urine bloody. Nurse will inform urology.  ? ?2-Acute hypoxic respiratory failure, secondary to pulmonary edema, related to volume overload: ?-Chest x ray 5/04: Mild worsening of  diffuse pulmonary edema. ?-Schedule nebulizer treatments, continue with  Guaifenesin.  ?-Continue with IV lasix, will do two doses today.  ?Wife report oxygen drop last night in the 70. He is currently off oxygen.  ?Plan to check oxygen on ambulation and Nocturnal pulse oxymetry.  ?Check swallow evaluation.  ? ?3-Acute metabolic encephalopathy: In the setting of hypoxemia, hospital delirium. ?-Continue with oxygen supplementation ?-Delirium precaution ?Improved.  ? ?4-Hematuria with nondraining Foley catheter: ?History of BPH with urinary retention ?Urology following patient will require TURP procedure scheduled for 5/18 ?Irrigation discontinue by urology  ? ?AKI: In the setting of sepsis and shock ?Function has improved ? ?A-fib with RVR, paroxysmal A-fib ?Hypertension hyperlipidemia ?He converted to sinus rhythm cardiology recommending amiodarone 400 mg twice daily for 7 days and then 200 mg daily for 21 days and then stop. ?Anticoagulation will need to be evaluated as an outpatient after patient undergo a TURP procedure ? ?Bladder spasm: On oxybutynin 3 times daily ? ?Lactic acidosis in the setting of hemorrhagic/septic shock ?Lactic acid peaked to 3.9. ?Lactic acidosis resolved ? ? ? ? ?Estimated body mass index is 27.9 kg/m? as calculated from the following: ?  Height as of this encounter: '5\' 7"'$  (1.702 m). ?  Weight as of this encounter: 80.8 kg. ? ? ?DVT prophylaxis: SCDs ?Code Status: Full code ?Family Communication: Wife updated at bedside ?Disposition Plan:  ?Status is: Inpatient ?Remains inpatient appropriate because: Management of hypoxemia, confusion, pulmonary edema hematuria.  Will request PT OT eval ? ? ? ?Consultants:  ?Cardiology ?Urology  ? ?Procedures:  ? ? ?Antimicrobials:  ? ? ?Subjective: ?He is sitting recliner,  still report cough and dyspnea. He is off oxygen this am. Last night oxygen drop to 17 per wife.  ? ?Objective: ?Vitals:  ? 08/11/21 2100 08/12/21 0458 08/12/21 0500 08/12/21 0745   ?BP: 121/65 133/76    ?Pulse: 64 65    ?Resp:  19    ?Temp:  98.6 ?F (37 ?C)    ?TempSrc:  Oral    ?SpO2:  97%  98%  ?Weight:   80.8 kg   ?Height:      ? ? ?Intake/Output Summary (Last 24 hours) at 08/12/2021 1009 ?Last data filed at 08/12/2021 9518 ?Gross per 24 hour  ?Intake 42410.27 ml  ?Output 48050 ml  ?Net -5639.73 ml  ? ? ?Filed Weights  ? 08/10/21 0500 08/11/21 0500 08/12/21 0500  ?Weight: 80.6 kg 83.4 kg 80.8 kg  ? ? ?Examination: ? ?General exam: NAD ?Respiratory system: BL crackles. No increase work of breathing.  ?Cardiovascular system: S 1, S 2 RRR  ?Gastrointestinal system: BS present, soft,  ?Central nervous system: alert, non focal.  ?Extremities: symmetry power.  ? ? ? ?Data Reviewed: I have personally reviewed following labs and imaging studies ? ?CBC: ?Recent Labs  ?Lab 08/07/21 ?1603 08/08/21 ?0250 08/09/21 ?8416 08/11/21 ?6063 08/12/21 ?0160  ?WBC 14.3* 11.8* 11.2* 13.0* 12.3*  ?HGB 8.6* 7.8* 8.9* 9.9* 9.9*  ?HCT 26.8* 23.8* 27.6* 30.5* 30.7*  ?MCV 84.8 83.5 85.4 86.2 85.8  ?PLT 170 174 213 234 289  ? ? ?Basic Metabolic Panel: ?Recent Labs  ?Lab 08/06/21 ?1093 08/07/21 ?0243 08/08/21 ?0250 08/09/21 ?2355 08/10/21 ?1257 08/11/21 ?7322 08/12/21 ?0254  ?NA 138 137 138 137 136 137 136  ?K 4.3 3.4* 3.6 3.5 3.3* 3.3* 4.0  ?CL 108 109 105 104 96* 98 97*  ?CO2 '25 24 26 26 29 30 31  '$ ?GLUCOSE 127* 112* 112* 114* 127* 124* 116*  ?BUN 19 13 7* 7* '9 10 11  '$ ?CREATININE 1.25* 1.00 0.94 0.90 1.00 1.01 1.02  ?CALCIUM 8.2* 7.7* 7.9* 8.2* 8.6* 8.5* 8.5*  ?MG 2.1 1.8  --  1.9  --   --   --   ?PHOS 2.5  --   --  3.1  --   --   --   ? ? ?GFR: ?Estimated Creatinine Clearance: 63.7 mL/min (by C-G formula based on SCr of 1.02 mg/dL). ?Liver Function Tests: ?Recent Labs  ?Lab 08/09/21 ?0238 08/10/21 ?1257 08/11/21 ?0414  ?AST '15 27 21  '$ ?ALT '13 18 14  '$ ?ALKPHOS 66 95 82  ?BILITOT 0.7 0.8 0.6  ?PROT 5.5* 6.8 6.2*  ?ALBUMIN 2.7* 3.2* 2.9*  ? ? ?No results for input(s): LIPASE, AMYLASE in the last 168 hours. ?No results for  input(s): AMMONIA in the last 168 hours. ?Coagulation Profile: ?No results for input(s): INR, PROTIME in the last 168 hours. ?Cardiac Enzymes: ?No results for input(s): CKTOTAL, CKMB, CKMBINDEX, TROPONINI in the last 168 hours. ?BNP (last 3 results) ?No results for input(s): PROBNP in the last 8760 hours. ?HbA1C: ?No results for input(s): HGBA1C in the last 72 hours. ?CBG: ?No results for input(s): GLUCAP in the last 168 hours. ?Lipid Profile: ?No results for input(s): CHOL, HDL, LDLCALC, TRIG, CHOLHDL, LDLDIRECT in the last 72 hours. ?Thyroid Function Tests: ?No results for input(s): TSH, T4TOTAL, FREET4, T3FREE, THYROIDAB in the last 72 hours. ?Anemia Panel: ?No results for input(s): VITAMINB12, FOLATE, FERRITIN, TIBC, IRON, RETICCTPCT in the last 72 hours. ?Sepsis Labs: ?Recent Labs  ?Lab 08/05/21 ?1610 08/05/21 ?1825 08/06/21 ?0002 08/06/21 ?2706 08/06/21 ?2376 08/07/21 ?2831  ?  PROCALCITON <0.10  --   --   --  8.74 5.38  ?LATICACIDVEN 1.6 3.9* 3.3* 2.8*  --   --   ? ? ? ?Recent Results (from the past 240 hour(s))  ?Culture, blood (Routine X 2) w Reflex to ID Panel     Status: Abnormal  ? Collection Time: 08/05/21  4:10 PM  ? Specimen: BLOOD  ?Result Value Ref Range Status  ? Specimen Description   Final  ?  BLOOD ?Performed at [facility], [place] [address], [place], [place] [address] ?  ? Special Requests   Final  ?  BOTTLES DRAWN AEROBIC AND ANAEROBIC Blood Culture results may not be optimal due to an inadequate volume of blood received in culture bottles ?Performed at [facility], [place] [address] [place], [place] [address] ?  ? Culture  Setup Time   Final  ?  GRAM NEGATIVE RODS ?IN BOTH AEROBIC AND ANAEROBIC BOTTLES ?CRITICAL RESULT CALLED TO, READ BACK BY AND VERIFIED WITH: PHARMD C SHADE [id number] AT 838 AM BY CM ?Performed at [facility], [place] [address], [place], [place] [address] ?  ? Culture ESCHERICHIA COLI (A)  Final  ? Report Status 08/08/2021 FINAL  Final  ?  Organism ID, Bacteria ESCHERICHIA COLI  Final  ?    Susceptibility  ? Escherichia coli - MIC*  ?  AMPICILLIN >=32 RESISTANT Resistant   ?  CEFAZOLIN <=4 SENSITIVE Sensitive   ?  CEFEPIME <=0.12 SENSITIVE Sensitiv

## 2021-08-12 NOTE — Progress Notes (Signed)
Pt set up on overnight pulse ox. ?

## 2021-08-13 DIAGNOSIS — D62 Acute posthemorrhagic anemia: Secondary | ICD-10-CM | POA: Diagnosis not present

## 2021-08-13 LAB — CBC
HCT: 32.7 % — ABNORMAL LOW (ref 39.0–52.0)
Hemoglobin: 10.4 g/dL — ABNORMAL LOW (ref 13.0–17.0)
MCH: 27.3 pg (ref 26.0–34.0)
MCHC: 31.8 g/dL (ref 30.0–36.0)
MCV: 85.8 fL (ref 80.0–100.0)
Platelets: 340 10*3/uL (ref 150–400)
RBC: 3.81 MIL/uL — ABNORMAL LOW (ref 4.22–5.81)
RDW: 14.9 % (ref 11.5–15.5)
WBC: 9.9 10*3/uL (ref 4.0–10.5)
nRBC: 0 % (ref 0.0–0.2)

## 2021-08-13 LAB — BASIC METABOLIC PANEL
Anion gap: 9 (ref 5–15)
BUN: 13 mg/dL (ref 8–23)
CO2: 31 mmol/L (ref 22–32)
Calcium: 8.6 mg/dL — ABNORMAL LOW (ref 8.9–10.3)
Chloride: 97 mmol/L — ABNORMAL LOW (ref 98–111)
Creatinine, Ser: 0.92 mg/dL (ref 0.61–1.24)
GFR, Estimated: >60 mL/min (ref >60)
Glucose, Bld: 112 mg/dL — ABNORMAL HIGH (ref 70–99)
Potassium: 3.9 mmol/L (ref 3.5–5.1)
Sodium: 137 mmol/L (ref 135–145)

## 2021-08-13 NOTE — Progress Notes (Signed)
?PROGRESS NOTE ? ? ? ?Joshua Alvarez  ZDG:[id number] DOB: 1945/05/19 DOA: 08/05/2021 ?PCP: Coral Spikes, DO  ? ?Brief Narrative: ?76 year old with past medical history significant for bladder spasm, prostatic hypertrophy, kidney stone, hypertension, paroxysmal A-fib on Eliquis, skin cancer, arthritis presented to [facility] with complaining of urinary obstruction.  Patient was seen at this facility night prior with exchange of Foley catheter due to the same complaints.  Patient Foley catheter was exchanged in the ED with good return of urine seen and patient was discharged from the emergency department.  He presented with recurrent obstructive symptoms.  Patient had significant bleeding from urethra with inability to place Foley catheter.  Urology consulted and placed the catheter successfully.  Patient blood pressure dropped requiring IV pressors.  He was admitted to the ICU for hemorrhagic and septic shock.  Blood cultures positive for E. coli.  He was treated initially with ceftriaxone. ? ?[facility] assumed care on 08/27/2021. ?He has required IV Lasix for anasarca and pulmonary edema. ?Last night he became more confused, per wife his oxygen dropped to the 40s, he was off of oxygen at that time.  After he was placed on oxygen his oxygen sats recover. ? ?Repeated chest x-ray 5/4 showed  mild worsening pulmonary edema. ? ? ? ?Assessment & Plan: ?  ?Principal Problem: ?  Acute blood loss anemia ? ?1-Hemorrhagic and septic shock secondary to acute blood loss anemia from traumatic Foley placement and E. coli bacteremia: ?-Patient was initially treated with IV pressors. ?-He received 2 unit of packed red blood cell. ?-Hb  has remained stable.  ?-Continue with Ancef.  He will need 2 weeks of treatment. And will need antibiotics until his Sx 5-19 ?-WBC trending down.  ?CBI stop by urology. Urine bloody. Nurse will inform urology.  ?Hb stable.  ? ?2-Acute hypoxic respiratory failure, secondary to pulmonary edema, related to  volume overload: ?-Chest x ray 5/04: Mild worsening of diffuse pulmonary edema. ?-Schedule nebulizer treatments, continue with  Guaifenesin.  ?=received IV lasix yesterday times 2.  ?Plan to check oxygen on ambulation and Nocturnal pulse oxymetry.  ?Did well with swallow evaluation.  ?He will need oxygen at bedtime.  ? ?3-Acute metabolic encephalopathy: In the setting of hypoxemia, hospital delirium. ?-Continue with oxygen supplementation ?-Delirium precaution ?Improved.  ? ?4-Hematuria with nondraining Foley catheter: ?History of BPH with urinary retention ?Urology following patient will require TURP procedure scheduled for 5/18 ?Irrigation discontinue by urology  ? ?AKI: In the setting of sepsis and shock ?Function has improved ? ?A-fib with RVR, paroxysmal A-fib ?Hypertension hyperlipidemia ?He converted to sinus rhythm cardiology recommending amiodarone 400 mg twice daily for 7 days and then 200 mg daily for 21 days and then stop. ?Anticoagulation will need to be evaluated as an outpatient after patient undergo a TURP procedure ?He had an episode of A fib RVR, subsequently sinus converted.  ? ?Bladder spasm: On oxybutynin 3 times daily ? ?Lactic acidosis in the setting of hemorrhagic/septic shock ?Lactic acid peaked to 3.9. ?Lactic acidosis resolved ? ? ? ? ?Estimated body mass index is 27.84 kg/m? as calculated from the following: ?  Height as of this encounter: '5\' 7"'$  (1.702 m). ?  Weight as of this encounter: 80.6 kg. ? ? ?DVT prophylaxis: SCDs ?Code Status: Full code ?Family Communication: Wife updated at bedside ?Disposition Plan:  ?Status is: Inpatient ?Remains inpatient appropriate because: Management of hypoxemia, confusion, pulmonary edema hematuria.  Will request PT OT eval ? ? ? ?Consultants:  ?Cardiology ?Urology  ? ?  Procedures:  ? ? ?Antimicrobials:  ? ? ?Subjective: ?He is feeling well, he ambulated in the hall.  ?He had episode of A fib RVR asymptomatic. While ambulation.  ?Cough persist   ? ?Objective: ?Vitals:  ? 08/12/21 2008 08/13/21 0500 08/13/21 0740 08/13/21 1254  ?BP: 122/81 121/73  111/69  ?Pulse: 65 60  63  ?Resp: '19 20  20  '$ ?Temp: 98.1 ?F (36.7 ?C) 98.8 ?F (37.1 ?C)  98.2 ?F (36.8 ?C)  ?TempSrc: Oral Oral  Oral  ?SpO2: 98% 98% 100% 100%  ?Weight:  80.6 kg    ?Height:      ? ? ?Intake/Output Summary (Last 24 hours) at 08/13/2021 1416 ?Last data filed at 08/13/2021 1253 ?Gross per 24 hour  ?Intake 1397.16 ml  ?Output 3200 ml  ?Net -1802.84 ml  ? ? ?Filed Weights  ? 08/11/21 0500 08/12/21 0500 08/13/21 0500  ?Weight: 83.4 kg 80.8 kg 80.6 kg  ? ? ?Examination: ? ?General exam: NAD ?Respiratory system: CTA ?Cardiovascular system: S1, S 2 IRR ?Gastrointestinal system: BS present, soft nt ?Central nervous system: Alert, non focal.  ?Extremities: no edema ? ? ? ?Data Reviewed: I have personally reviewed following labs and imaging studies ? ?CBC: ?Recent Labs  ?Lab 08/08/21 ?0250 08/09/21 ?9892 08/11/21 ?1194 08/12/21 ?1740 08/13/21 ?0408  ?WBC 11.8* 11.2* 13.0* 12.3* 9.9  ?HGB 7.8* 8.9* 9.9* 9.9* 10.4*  ?HCT 23.8* 27.6* 30.5* 30.7* 32.7*  ?MCV 83.5 85.4 86.2 85.8 85.8  ?PLT 174 213 234 289 340  ? ? ?Basic Metabolic Panel: ?Recent Labs  ?Lab 08/07/21 ?0243 08/08/21 ?0250 08/09/21 ?8144 08/10/21 ?1257 08/11/21 ?8185 08/12/21 ?6314 08/13/21 ?0408  ?NA 137   < > 137 136 137 136 137  ?K 3.4*   < > 3.5 3.3* 3.3* 4.0 3.9  ?CL 109   < > 104 96* 98 97* 97*  ?CO2 24   < > '26 29 30 31 31  '$ ?GLUCOSE 112*   < > 114* 127* 124* 116* 112*  ?BUN 13   < > 7* '9 10 11 13  '$ ?CREATININE 1.00   < > 0.90 1.00 1.01 1.02 0.92  ?CALCIUM 7.7*   < > 8.2* 8.6* 8.5* 8.5* 8.6*  ?MG 1.8  --  1.9  --   --   --   --   ?PHOS  --   --  3.1  --   --   --   --   ? < > = values in this interval not displayed.  ? ? ?GFR: ?Estimated Creatinine Clearance: 70.6 mL/min (by C-G formula based on SCr of 0.92 mg/dL). ?Liver Function Tests: ?Recent Labs  ?Lab 08/09/21 ?0238 08/10/21 ?1257 08/11/21 ?0414  ?AST '15 27 21  '$ ?ALT '13 18 14  '$ ?ALKPHOS 66 95 82   ?BILITOT 0.7 0.8 0.6  ?PROT 5.5* 6.8 6.2*  ?ALBUMIN 2.7* 3.2* 2.9*  ? ? ?No results for input(s): LIPASE, AMYLASE in the last 168 hours. ?No results for input(s): AMMONIA in the last 168 hours. ?Coagulation Profile: ?No results for input(s): INR, PROTIME in the last 168 hours. ?Cardiac Enzymes: ?No results for input(s): CKTOTAL, CKMB, CKMBINDEX, TROPONINI in the last 168 hours. ?BNP (last 3 results) ?No results for input(s): PROBNP in the last 8760 hours. ?HbA1C: ?No results for input(s): HGBA1C in the last 72 hours. ?CBG: ?No results for input(s): GLUCAP in the last 168 hours. ?Lipid Profile: ?No results for input(s): CHOL, HDL, LDLCALC, TRIG, CHOLHDL, LDLDIRECT in the last 72 hours. ?Thyroid Function Tests: ?No results  for input(s): TSH, T4TOTAL, FREET4, T3FREE, THYROIDAB in the last 72 hours. ?Anemia Panel: ?No results for input(s): VITAMINB12, FOLATE, FERRITIN, TIBC, IRON, RETICCTPCT in the last 72 hours. ?Sepsis Labs: ?Recent Labs  ?Lab 08/07/21 ?0243  ?PROCALCITON 5.38  ? ? ? ?Recent Results (from the past 240 hour(s))  ?Culture, blood (Routine X 2) w Reflex to ID Panel     Status: Abnormal  ? Collection Time: 08/05/21  4:10 PM  ? Specimen: BLOOD  ?Result Value Ref Range Status  ? Specimen Description   Final  ?  BLOOD ?Performed at [facility], [place] [address] [place], [place] [address] ?  ? Special Requests   Final  ?  BOTTLES DRAWN AEROBIC AND ANAEROBIC Blood Culture results may not be optimal due to an inadequate volume of blood received in culture bottles ?Performed at [facility], [place] [address] [place], [place] [address] ?  ? Culture  Setup Time   Final  ?  GRAM NEGATIVE RODS ?IN BOTH AEROBIC AND ANAEROBIC BOTTLES ?CRITICAL RESULT CALLED TO, READ BACK BY AND VERIFIED WITH: PHARMD C SHADE [id number] AT 838 AM BY CM ?Performed at [facility], [place] [address] [place], [place] [address] ?  ? Culture ESCHERICHIA COLI (A)  Final  ? Report Status 08/08/2021 FINAL   Final  ? Organism ID, Bacteria ESCHERICHIA COLI  Final  ?    Susceptibility  ? Escherichia coli - MIC*  ?  AMPICILLIN >=32 RESISTANT Resistant   ?  CEFAZOLIN <=4 SENSITIVE Sensitive   ?  CEFEPIME <=0.12 SENSITIVE

## 2021-08-14 DIAGNOSIS — D62 Acute posthemorrhagic anemia: Secondary | ICD-10-CM | POA: Diagnosis not present

## 2021-08-14 DIAGNOSIS — I10 Essential (primary) hypertension: Secondary | ICD-10-CM | POA: Diagnosis not present

## 2021-08-14 DIAGNOSIS — I4891 Unspecified atrial fibrillation: Secondary | ICD-10-CM | POA: Diagnosis not present

## 2021-08-14 LAB — BASIC METABOLIC PANEL
Anion gap: 8 (ref 5–15)
BUN: 13 mg/dL (ref 8–23)
CO2: 29 mmol/L (ref 22–32)
Calcium: 8.7 mg/dL — ABNORMAL LOW (ref 8.9–10.3)
Chloride: 97 mmol/L — ABNORMAL LOW (ref 98–111)
Creatinine, Ser: 1.03 mg/dL (ref 0.61–1.24)
GFR, Estimated: >60 mL/min (ref >60)
Glucose, Bld: 102 mg/dL — ABNORMAL HIGH (ref 70–99)
Potassium: 3.8 mmol/L (ref 3.5–5.1)
Sodium: 134 mmol/L — ABNORMAL LOW (ref 135–145)

## 2021-08-14 LAB — CBC
HCT: 31.6 % — ABNORMAL LOW (ref 39.0–52.0)
Hemoglobin: 10 g/dL — ABNORMAL LOW (ref 13.0–17.0)
MCH: 27.2 pg (ref 26.0–34.0)
MCHC: 31.6 g/dL (ref 30.0–36.0)
MCV: 86.1 fL (ref 80.0–100.0)
Platelets: 367 10*3/uL (ref 150–400)
RBC: 3.67 MIL/uL — ABNORMAL LOW (ref 4.22–5.81)
RDW: 14.9 % (ref 11.5–15.5)
WBC: 9.7 10*3/uL (ref 4.0–10.5)
nRBC: 0 % (ref 0.0–0.2)

## 2021-08-14 MED ORDER — CEPHALEXIN 500 MG PO CAPS: 500.0000 mg | ORAL_CAPSULE | Freq: Four times a day (QID) | ORAL | 0 refills | Status: DC

## 2021-08-14 MED ORDER — CEPHALEXIN 500 MG PO CAPS: 500.0000 mg | ORAL_CAPSULE | Freq: Three times a day (TID) | ORAL | 0 refills | Status: DC

## 2021-08-14 MED ORDER — AMIODARONE HCL 200 MG PO TABS
200.0000 mg | ORAL_TABLET | Freq: Every day | ORAL | 0 refills | Status: DC
Start: 2021-08-16 — End: 2021-09-14

## 2021-08-14 MED ORDER — ATENOLOL 50 MG PO TABS: 50.0000 mg | ORAL_TABLET | Freq: Every morning | ORAL | 2 refills | Status: DC

## 2021-08-14 MED ORDER — CEPHALEXIN 500 MG PO CAPS
500.0000 mg | ORAL_CAPSULE | Freq: Four times a day (QID) | ORAL | Status: DC
  Administered 2021-08-14: 500 mg via ORAL
  Filled 2021-08-14: qty 1

## 2021-08-14 MED ORDER — CEPHALEXIN 500 MG PO CAPS: 500.0000 mg | ORAL_CAPSULE | Freq: Three times a day (TID) | ORAL | Status: DC

## 2021-08-14 MED ORDER — OXYBUTYNIN CHLORIDE 5 MG PO TABS: 5.0000 mg | ORAL_TABLET | Freq: Three times a day (TID) | ORAL | 0 refills | Status: DC | PRN

## 2021-08-14 MED ORDER — POLYETHYLENE GLYCOL 3350 17 G PO PACK: 17.0000 g | PACK | Freq: Every day | ORAL | 0 refills | Status: DC

## 2021-08-14 MED ORDER — AMIODARONE HCL 400 MG PO TABS: 400.0000 mg | ORAL_TABLET | Freq: Two times a day (BID) | ORAL | 0 refills | Status: DC

## 2021-08-14 MED ORDER — GUAIFENESIN 100 MG/5ML PO LIQD: 5.0000 mL | ORAL | 0 refills | Status: DC | PRN

## 2021-08-14 NOTE — TOC Progression Note (Addendum)
Transition of Care (TOC) - Progression Note  ? ? ?Patient Details  ?Name: Joshua Alvarez ?MRN: [id number] ?Date of Birth: 1946/01/03 ? ?Transition of Care (TOC) CM/SW Contact  ?Tawanna Cooler, RN ?Phone Number: ?08/14/2021, 9:15 AM ? ?Clinical Narrative:    ? ?Received a message from the attending that patient is a possible discharge today, and he will need O2 at night.  Called patient in the room.  He does not have a preference for a DME company.   ?CM called [facility] [place] and spoke with [facility].  Let her know patient needs nocturnal O2 and is a possible discharge today.   ? ?1009 addendum: Sent the overnight oximetry study to [employer] via secure email.  ? ?1042 addendum: [employer] rep states the home O2 equipment will be delivered to patient's home later today, and they will teach him how to use it at that time.  ? ?

## 2021-08-14 NOTE — Plan of Care (Signed)
Discharge instructions reviewed with patient and wife, questions answered, verbalized understanding.  Patient transported via wheelchair to main entrance to be taken home by wife.  

## 2021-08-14 NOTE — Discharge Summary (Signed)
Physician Discharge Summary  ?Travers Goodley Moncur HDQ:[id number] DOB: 17-Jan-1946 DOA: 08/05/2021 ? ?PCP: Coral Spikes, DO ? ?Admit date: 08/05/2021 ?Discharge date: 08/14/2021 ? ?Admitted From: Home  ?Disposition:  Home ? ?Recommendations for Outpatient Follow-up:  ?Follow up with PCP in 1-2 weeks ?Please obtain BMP/CBC in one week ?Needs to follow up with urology for TURP procedure.  ? ?Equipment/Devices: Home oxygen.  ? ?Discharge Condition: Stable.  ?CODE STATUS: Full  Code ?Diet recommendation: Heart Healthy  ? ? ?Brief/Interim Summary: ?76 year old with past medical history significant for bladder spasm, prostatic hypertrophy, kidney stone, hypertension, paroxysmal A-fib on Eliquis, skin cancer, arthritis presented to [facility] with complaining of urinary obstruction.  Patient was seen at this facility night prior with exchange of Foley catheter due to the same complaints.  Patient Foley catheter was exchanged in the ED with good return of urine seen and patient was discharged from the emergency department.  He presented with recurrent obstructive symptoms.  Patient had significant bleeding from urethra with inability to place Foley catheter.  Urology consulted and placed the catheter successfully.  Patient blood pressure dropped requiring IV pressors.  He was admitted to the ICU for hemorrhagic and septic shock.  Blood cultures positive for E. coli.  He was treated initially with ceftriaxone. ?  ?[facility] assumed care on 08/27/2021. ?He has required IV Lasix for anasarca and pulmonary edema. ?Last night he became more confused, per wife his oxygen dropped to the 40s, he was off of oxygen at that time.  After he was placed on oxygen his oxygen sats recover. ?  ?Repeated chest x-ray 5/4 showed  mild worsening pulmonary edema. Received lasix. Resolved. Oxygen sat 100 %  on RA ?  ? ?1-Hemorrhagic and septic shock secondary to acute blood loss anemia from traumatic Foley placement and E. coli bacteremia: ?-Patient was  initially treated with IV pressors. ?-He received 2 unit of packed red blood cell. ?-Hb  has remained stable.  ?-Continue with Ancef.  He will need 2 weeks of treatment. And will need antibiotics until his Sx 5-19 ?-WBC trending down.  ?CBI stop by urology. Urine clear.  ?Hb stable.  ?Discharge on Keflex QID for 12 days until he gets his SX ? ?2-Acute hypoxic respiratory failure, secondary to pulmonary edema, related to volume overload: ?-Chest x ray 5/04: Mild worsening of diffuse pulmonary edema. ?-Schedule nebulizer treatments, continue with  Guaifenesin.  ?Received IV lasix while in the hospital.  ?Plan to check oxygen on ambulation and Nocturnal pulse oxymetry.  ?Did well with swallow evaluation.  ?He will need oxygen at bedtime. CM will arrange.  ?  ?3-Acute metabolic encephalopathy: In the setting of hypoxemia, hospital delirium. ?-Continue with oxygen supplementation ?-Delirium precaution ?Improved.  ?  ?4-Hematuria with nondraining Foley catheter: ?History of BPH with urinary retention ?Urology following patient will require TURP procedure scheduled for 5/18 ?Irrigation discontinue by urology  ?  ?AKI: In the setting of sepsis and shock ?Function has improved ?  ?A-fib with RVR, paroxysmal A-fib ?Hypertension hyperlipidemia ?He converted to sinus rhythm cardiology recommending amiodarone 400 mg twice daily for 7 days and then 200 mg daily for 21 days and then stop. ?Anticoagulation will need to be evaluated as an outpatient after patient undergo a TURP procedure ?He had an episode of A fib RVR, subsequently sinus converted.  ?Stable. Discharge on amiodarone taper.  ?  ?Bladder spasm: On oxybutynin 3 times daily ?  ?Lactic acidosis in the setting of hemorrhagic/septic shock ?Lactic acid peaked to 3.9. ?  Lactic acidosis resolved ?  ?  ?  ?  ?Estimated body mass index is 27.84 kg/m? as calculated from the following: ?  Height as of this encounter: '5\' 7"'$  (1.702 m). ?  Weight as of this encounter: 80.6 kg. ?  ?   ? ?  ? ?Discharge Diagnoses:  ?Principal Problem: ?  Acute blood loss anemia ?Hemorrhagic and septic shock secondary to acute blood loss anemia from traumatic Foley placement and E. coli bacteremia ?Acute hypoxic respiratory failure, secondary to pulmonary edema, related to volume overload ?Acute metabolic encephalopathy: In the setting of hypoxemia, hospital delirium. ?Hematuria with nondraining Foley catheter: ?History of BPH with urinary retention ?AKI ?A-fib with RVR, paroxysmal A-fib ?Hypertension hyperlipidemia ?Bladder spasm ?Lactic acidosis in the setting of hemorrhagic/septic shock ? ?  ? ? ?Discharge Instructions ? ?Discharge Instructions   ? ? Diet - low sodium heart healthy   Complete by: As directed ?  ? Increase activity slowly   Complete by: As directed ?  ? ?  ? ?Allergies as of 08/14/2021   ? ?   Reactions  ? Statins Other (See Comments)  ? Muscle aches joint pain unable to take any statins have tried several  ? Clarithromycin Swelling  ? Swelling face and in mouth  ? ?  ? ?  ?Medication List  ?  ? ?STOP taking these medications   ? ?apixaban 5 MG Tabs tablet ?Commonly known as: ELIQUIS ?  ?benzonatate 200 MG capsule ?Commonly known as: TESSALON ?  ?hydrochlorothiazide 25 MG tablet ?Commonly known as: HYDRODIURIL ?  ?mirabegron ER 25 MG Tb24 tablet ?Commonly known as: Myrbetriq ?  ? ?  ? ?TAKE these medications   ? ?amiodarone 400 MG tablet ?Commonly known as: PACERONE ?Take 1 tablet (400 mg total) by mouth 2 (two) times daily for 2 days. ?Notes to patient: 08/14/2021 evening dose ?  ?amiodarone 200 MG tablet ?Commonly known as: PACERONE ?Take 1 tablet (200 mg total) by mouth daily for 21 days. ?Start taking on: Aug 16, 2021 ?Notes to patient: 08/16/2021 ?  ?atenolol 50 MG tablet ?Commonly known as: TENORMIN ?Take 1 tablet (50 mg total) by mouth every morning. ?What changed: medication strength ?Notes to patient: 08/15/2021  ?  ?cephALEXin 500 MG capsule ?Commonly known as: KEFLEX ?Take 1 capsule (500 mg  total) by mouth every 6 (six) hours for 12 days. ?Notes to patient: 08/14/2021 evening dose  ?  ?enalapril 10 MG tablet ?Commonly known as: VASOTEC ?Take 1 tablet (10 mg total) by mouth daily. ?What changed: when to take this ?Notes to patient: 08/15/2021  ?  ?ezetimibe 10 MG tablet ?Commonly known as: Zetia ?Take 1 tablet (10 mg total) by mouth daily. ?Notes to patient: 08/15/2021  ?  ?finasteride 5 MG tablet ?Commonly known as: PROSCAR ?Take 5 mg by mouth every morning. ?Notes to patient: 08/15/2021  ?  ?guaiFENesin 100 MG/5ML liquid ?Commonly known as: ROBITUSSIN ?Take 5 mLs by mouth every 4 (four) hours as needed for cough or to loosen phlegm. ?  ?oxybutynin 5 MG tablet ?Commonly known as: DITROPAN ?Take 1 tablet (5 mg total) by mouth every 8 (eight) hours as needed for bladder spasms. ?  ?polyethylene glycol 17 g packet ?Commonly known as: MIRALAX / GLYCOLAX ?Take 17 g by mouth daily. ?  ?tamsulosin 0.4 MG Caps capsule ?Commonly known as: FLOMAX ?TAKE ONE CAPSULE (0.'4MG'$  TOTAL) BY MOUTH DAILY ?What changed:  ?how much to take ?when to take this ?additional instructions ?Notes to patient: 08/14/2021 evening  dose  ?  ? ?  ? ?  ?  ? ? ?  ?Durable Medical Equipment  ?(From admission, onward)  ?  ? ? ?  ? ?  Start     Ordered  ? 08/13/21 1417  For home use only DME oxygen  Once       ?Question Answer Comment  ?Length of Need 6 Months   ?Mode or (Route) Nasal cannula   ?Liters per Minute 2   ?Frequency Only at night (stationary unit needed)   ?Oxygen delivery system Gas   ?  ? 08/13/21 1416  ? ?  ?  ? ?  ? ? Follow-up Information   ? ? Thersa Salt G, DO Follow up in 1 week(s).   ?Specialty: Family Medicine ?Contact information: ?[address] ?[address] ?[place] [address] ?[phone] ? ? ?  ?  ? ? Satira Sark, MD .   ?Specialty: Cardiology ?Contact information: ?[address] ?[address] ?[place] [address] ?[phone] ? ? ?  ?  ? ? Ardis Hughs, MD Follow up in 1 week(s).   ?Specialty: Urology ?Contact  information: ?[place] ?[place] [address] ?[phone] ? ? ?  ?  ? ?  ?  ? ?  ? ?Allergies  ?Allergen Reactions  ? Statins Other (See Comments)  ?  Muscle aches joint pain unable to take any statins have tried seve

## 2021-08-15 ENCOUNTER — Telehealth: Payer: Self-pay

## 2021-08-15 NOTE — Telephone Encounter (Signed)
Transition Care Management Follow-up Telephone Call ?Date of discharge and from where: 08/14/21 [facility] ?How have you been since you were released from the hospital? Pt states he is doing okay. ?Any questions or concerns? No ? ?Items Reviewed: ?Did the pt receive and understand the discharge instructions provided? Yes  ?Medications obtained and verified? Yes  ?Other? No  ?Any new allergies since your discharge? No  ?Dietary orders reviewed? Yes ?Do you have support at home? Yes  ? ?Home Care and Equipment/Supplies: ?Were home health services ordered? no ?If so, what is the name of the agency? N/a  ?Has the agency set up a time to come to the patient's home? not applicable ?Were any new equipment or medical supplies ordered?  No ?What is the name of the medical supply agency? N/A ?Were you able to get the supplies/equipment? not applicable ?Do you have any questions related to the use of the equipment or supplies? No ? ?Functional Questionnaire: (I = Independent and D = Dependent) ?ADLs: I ? ?Bathing/Dressing- I ? ?Meal Prep- I ? ?Eating- I ? ?Maintaining continence- I ? ?Transferring/Ambulation- I ? ?Managing Meds- I ? ?Follow up appointments reviewed: ? ?PCP Hospital f/u appt confirmed? Yes  Scheduled to see Dr. Lacinda Axon on 08/16/21 @ 2:50. ?[facility] f/u appt confirmed? Yes  Scheduled to see Dr. Louis Meckel on 08/19/21 @ 9 am. ?Are transportation arrangements needed? No  ?If their condition worsens, is the pt aware to call PCP or go to the Emergency Dept.? Yes ?Was the patient provided with contact information for the PCP's office or ED? Yes ?Was to pt encouraged to call back with questions or concerns? Yes ? ?

## 2021-08-16 ENCOUNTER — Ambulatory Visit (INDEPENDENT_AMBULATORY_CARE_PROVIDER_SITE_OTHER): Payer: Medicare HMO | Admitting: Family Medicine

## 2021-08-16 DIAGNOSIS — D62 Acute posthemorrhagic anemia: Secondary | ICD-10-CM

## 2021-08-16 DIAGNOSIS — I480 Paroxysmal atrial fibrillation: Secondary | ICD-10-CM | POA: Diagnosis not present

## 2021-08-16 NOTE — Progress Notes (Signed)
? ?Subjective:  ?Patient ID: Joshua Alvarez, male    DOB: [birth year]  Age: 76 y.o. MRN: [id number] ? ?CC: ?Chief Complaint  ?Patient presents with  ? Transitions Of Care  ?  Hematuria with bladder obstruction- scheduled for TURP in 2 weeks  ? ? ?HPI: ? ?76 year old male presents for hospital follow-up. ? ?Patient recently found to have atrial fibrillation and was placed on anticoagulation.  Patient has had ongoing issues with BPH and is scheduled for TURP.  Initially seen in the ER on 4/27.  Had urinary retention and Foley was placed.  Patient returned to the ER on 4/28.  He noted to have blood around his catheter and was unable to pass urine via the catheter.  He also had urinary urgency and lower abdominal distention.  Patient subsequently became hypotensive and was found to have a significant drop in his hemoglobin requiring hospital admission. ? ?Patient suffered hemorrhagic shock from acute blood loss anemia and also found to have E. coli bacteremia.  Was given packed red blood cells and was given IV fluids and pressors.  Slowly improved during hospitalization.  Was discharged home on Keflex.  Of note, his Eliquis was held due to bleeding and he was placed on amiodarone. ? ?Patient presents today for follow-up.  He states that he is doing well.  He has a Foley catheter in place and is having no issues at this time.  Remains off anticoagulation.  Has upcoming TURP later this month.  It was previously recommended by cardiology that he hold his Eliquis for 48 hours and then this could subsequently be restarted by urology.  However, given his recent hospital admission we are holding his Eliquis and this will be reevaluated after his procedure.  He is currently doing well.  No chest pain or shortness of breath.  Has had recent labs.  Hemoglobin is stable. ? ? ?Patient Active Problem List  ? Diagnosis Date Noted  ? Atrial fibrillation ([place]) 08/17/2021  ? Acute blood loss anemia 08/05/2021  ? Prediabetes 07/18/2021  ?  Basal cell carcinoma of skin of scalp and neck 03/22/2021  ? Statin intolerance 08/12/2020  ? Hyperlipidemia 11/19/2012  ? Essential hypertension, benign 11/19/2012  ? BPH (benign prostatic hyperplasia) 11/19/2012  ? ? ?Social Hx   ?Social History  ? ?Socioeconomic History  ? Marital status: Married  ?  Spouse name: Lelon Frohlich  ? Number of children: 2  ? Years of education: Not on file  ? Highest education level: Not on file  ?Occupational History  ? Not on file  ?Tobacco Use  ? Smoking status: Former  ?  Years: 30.00  ?  Types: Cigarettes  ?  Quit date: 04/2010  ?  Years since quitting: 11.3  ? Smokeless tobacco: Never  ?Vaping Use  ? Vaping Use: Never used  ?Substance and Sexual Activity  ? Alcohol use: Not Currently  ? Drug use: Never  ? Sexual activity: Not on file  ?Other Topics Concern  ? Not on file  ?Social History Narrative  ? Retired from [employer] in Engineer, materials.  ? 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  ? 1 grandson and 1 granddaughter  ? 2 step grandsons  ? ?Social Determinants of Health  ? ?Financial Resource Strain: Low Risk   ? Difficulty of Paying Living Expenses: Not hard at all  ?Food Insecurity: No Food Insecurity  ? Worried About Charity fundraiser in the Last Year: Never true  ? Ran Out of Food in the Last  Year: Never true  ?Transportation Needs: No Transportation Needs  ? Lack of Transportation (Medical): No  ? Lack of Transportation (Non-Medical): No  ?Physical Activity: Insufficiently Active  ? Days of Exercise per Week: 4 days  ? Minutes of Exercise per Session: 30 min  ?Stress: No Stress Concern Present  ? Feeling of Stress : Not at all  ?Social Connections: Socially Integrated  ? Frequency of Communication with Friends and Family: More than three times a week  ? Frequency of Social Gatherings with Friends and Family: More than three times a week  ? Attends Religious Services: More than 4 times per year  ? Active Member of Clubs or Organizations: Yes  ? Attends Archivist Meetings: More than 4  times per year  ? Marital Status: Married  ? ? ?Review of Systems ?Per HPI ? ?Objective:  ?BP 104/70   Ht '5\' 7"'$  (1.702 m)   Wt 168 lb 3.2 oz (76.3 kg)   BMI 26.34 kg/m?  ? ? ?  08/16/2021  ?  2:59 PM 08/14/2021  ?  5:55 AM 08/13/2021  ?  8:26 PM  ?BP/Weight  ?Systolic BP 258 527 782  ?Diastolic BP 70 90 79  ?Wt. (Lbs) 168.2    ?BMI 26.34 kg/m2    ? ? ?Physical Exam ?Vitals and nursing note reviewed.  ?Constitutional:   ?   General: He is not in acute distress. ?   Appearance: Normal appearance.  ?HENT:  ?   Head: Normocephalic and atraumatic.  ?Cardiovascular:  ?   Rate and Rhythm: Normal rate and regular rhythm.  ?Pulmonary:  ?   Effort: Pulmonary effort is normal.  ?   Breath sounds: Normal breath sounds. No wheezing, rhonchi or rales.  ?Abdominal:  ?   General: There is no distension.  ?   Palpations: Abdomen is soft.  ?   Tenderness: There is no abdominal tenderness.  ?Neurological:  ?   Mental Status: He is alert.  ?Psychiatric:     ?   Mood and Affect: Mood normal.     ?   Behavior: Behavior normal.  ? ? ?Lab Results  ?Component Value Date  ? WBC 9.7 08/14/2021  ? HGB 10.0 (L) 08/14/2021  ? HCT 31.6 (L) 08/14/2021  ? PLT 367 08/14/2021  ? GLUCOSE 102 (H) 08/14/2021  ? CHOL 222 (H) 05/30/2021  ? TRIG 140 05/30/2021  ? HDL 44 05/30/2021  ? LDLCALC 153 (H) 05/30/2021  ? ALT 14 08/11/2021  ? AST 21 08/11/2021  ? NA 134 (L) 08/14/2021  ? K 3.8 08/14/2021  ? CL 97 (L) 08/14/2021  ? CREATININE 1.03 08/14/2021  ? BUN 13 08/14/2021  ? CO2 29 08/14/2021  ? PSA 3.9 07/27/2014  ? HGBA1C 5.9 (H) 05/30/2021  ? ? ? ?Assessment & Plan:  ? ?Problem List Items Addressed This Visit   ? ?  ? Cardiovascular and Mediastinum  ? Atrial fibrillation ([place])  ?  Patient is in sinus rhythm today.  Continue amiodarone. ? ?  ?  ?  ? Other  ? Acute blood loss anemia  ?  Recent hospitalization with shock.  Was given packed red blood cells.  He is doing well at this time. ?We will remain off of anticoagulation and this will be reevaluated after  surgery. ?Medically stable to have his upcoming TURP. ? ?  ?  ? ?Follow-up:  Return in about 3 months (around 11/16/2021). ? ?Thersa Salt DO ?[place] ? ?

## 2021-08-16 NOTE — Patient Instructions (Signed)
I am going to send in your clearance for surgery. ? ?Follow up in 3 months. ? ?Take care ? ?Dr. Lacinda Axon  ?

## 2021-08-17 DIAGNOSIS — I4891 Unspecified atrial fibrillation: Secondary | ICD-10-CM | POA: Insufficient documentation

## 2021-08-17 NOTE — Progress Notes (Addendum)
COVID Vaccine Completed: Yes ? ?Date of COVID positive in last 90 days:  No ? ?PCP - Thersa Salt, DO ?Cardiologist - Rozann Lesches, MD ? ?Chest x-ray - 08-11-21 Epic ?EKG - 07-29-21 Epic ?Stress Test - N/A ?ECHO - 07-29-21 Epic ?Cardiac Cath - N/A ?Pacemaker/ICD device last checked: ?Spinal Cord Stimulator: ? ?Bowel Prep - N/A ? ?Sleep Study - N/A ?CPAP -  ? ?Fasting Blood Sugar - N/A ?Checks Blood Sugar _____ times a day ? ?Blood Thinner Instructions:   Eliquis is on hold due to blood loss ?Aspirin Instructions: ?Last Dose: ? ?Activity level:   Can go up a flight of stairs and perform activities of daily living without stopping and without symptoms of chest pain or shortness of breath. ?  ?Anesthesia review: Afib, HTN ?Recent 10 day hospitalization  ? ?Patient has been on O2 2 L at night since hospital discharge. ? ?Patient denies shortness of breath, fever, cough and chest pain at PAT appointment ? ?Patient verbalized understanding of instructions that were given to them at the PAT appointment. Patient was also instructed that they will need to review over the PAT instructions again at home before surgery.  ?

## 2021-08-17 NOTE — Assessment & Plan Note (Signed)
Patient is in sinus rhythm today.  Continue amiodarone. ?

## 2021-08-17 NOTE — Patient Instructions (Addendum)
DUE TO COVID-19 ONLY TWO VISITORS  (aged 75 and older)  IS ALLOWED TO COME WITH YOU AND STAY IN THE WAITING ROOM ONLY DURING PRE OP AND PROCEDURE.   ?**NO VISITORS ARE ALLOWED IN THE SHORT STAY AREA OR RECOVERY ROOM!!** ? ?IF YOU WILL BE ADMITTED INTO THE HOSPITAL YOU ARE ALLOWED ONLY FOUR SUPPORT PEOPLE DURING VISITATION HOURS ONLY (7 AM -8PM)   ?The support person(s) must pass our screening, gel in and out ?Visitors GUEST BADGE MUST BE WORN VISIBLY  ?One adult visitor may remain with you overnight and MUST be in the room by 8 P.M.  ? ?You are not required to quarantine ?Hand Hygiene often ?Do NOT share personal items ?Notify your provider if you are in close contact with someone who has COVID or you develop fever 100.4 or greater, new onset of sneezing, cough, sore throat, shortness of breath or body aches. ?     ? Your procedure is scheduled on:  08-26-21 ? ? Report to [facility] Main Entrance ? ?  Report to admitting at 6:45 AM ? ? Call this number if you have problems the morning of surgery [phone] ? ? Do not eat food or drink liquids :After Midnight. ? ? ?FOLLOW ANY ADDITIONAL PRE OP INSTRUCTIONS YOU RECEIVED FROM YOUR SURGEON'S OFFICE!!! ?  ?  ?Oral Hygiene is also important to reduce your risk of infection.                                    ?Remember - BRUSH YOUR TEETH THE MORNING OF SURGERY WITH YOUR REGULAR TOOTHPASTE ? ? Do NOT smoke after Midnight ? ? Take these medicines the morning of surgery with A SIP OF WATER: Amiodarone, Atenolol, Ezetimibe, Finasteride, Oxybutynin, Tamsulosin ?                  ?           You may not have any metal on your body including  jewelry, and body piercing ? ?           Do not wear  lotions, powders, cologne, or deodorant ? ?            Men may shave face and neck. ? ? Do not bring valuables to the hospital. [place]. ? ? Contacts, dentures or bridgework may not be worn into surgery. ? ? Bring [place] overnight bag day  of surgery. ? ?Please read over the following fact sheets you were given: IF [place]  ? ?[place] - Preparing for Surgery ?Before surgery, you can play an important role.  Because skin is not sterile, your skin needs to be as free of germs as possible.  You can reduce the number of germs on your skin by washing with CHG (chlorahexidine gluconate) soap before surgery.  CHG is an antiseptic cleaner which kills germs and bonds with the skin to continue killing germs even after washing. ?Please DO NOT use if you have an allergy to CHG or antibacterial soaps.  If your skin becomes reddened/irritated stop using the CHG and inform your nurse when you arrive at Short Stay. ?Do not shave (including legs and underarms) for at least 48 hours prior to the first CHG shower.  You may shave your face/neck. ? ?Please follow these instructions carefully: ?  1.  Shower with CHG Soap the night before surgery and the  morning of surgery. ? 2.  If you choose to wash your hair, wash your hair first as usual with your normal  shampoo. ? 3.  After you shampoo, rinse your hair and body thoroughly to remove the shampoo.                            ? 4.  Use CHG as you would any other liquid soap.  You can apply chg directly to the skin and wash.  Gently with a scrungie or clean washcloth. ? 5.  Apply the CHG Soap to your body ONLY FROM THE NECK DOWN.   Do   not use on face/ open      ?                     Wound or open sores. Avoid contact with eyes, ears mouth and   genitals (private parts).  ?                     Production manager,  Genitals (private parts) with your normal soap. ?            6.  Wash thoroughly, paying special attention to the area where your    surgery  will be performed. ? 7.  Thoroughly rinse your body with warm water from the neck down. ? 8.  DO NOT shower/wash with your normal soap after using and rinsing off the CHG Soap. ?               9.  Pat  yourself dry with a clean towel. ?           10.  Wear clean pajamas. ?           11.  Place clean sheets on your bed the night of your first shower and do not  sleep with pets. ?Day of Surgery : ?Do not apply any lotions/deodorants the morning of surgery.  Please wear clean clothes to the hospital/surgery center. ? ?FAILURE TO FOLLOW THESE INSTRUCTIONS MAY RESULT IN THE CANCELLATION OF YOUR SURGERY ? ?PATIENT SIGNATURE_________________________________ ? ?NURSE SIGNATURE__________________________________ ? ?________________________________________________________________________  ?  ?

## 2021-08-17 NOTE — Assessment & Plan Note (Signed)
Recent hospitalization with shock.  Was given packed red blood cells.  He is doing well at this time. ?We will remain off of anticoagulation and this will be reevaluated after surgery. ?Medically stable to have his upcoming TURP. ?

## 2021-08-19 ENCOUNTER — Other Ambulatory Visit: Payer: Self-pay

## 2021-08-19 ENCOUNTER — Encounter (HOSPITAL_COMMUNITY): Payer: Self-pay

## 2021-08-19 ENCOUNTER — Encounter (HOSPITAL_COMMUNITY)
Admission: RE | Admit: 2021-08-19 | Discharge: 2021-08-19 | Disposition: A | Discharge status: Home / Self Care | Payer: Medicare HMO | Source: Ambulatory Visit | Attending: Urology | Admitting: Urology

## 2021-08-19 DIAGNOSIS — Z01818 Encounter for other preprocedural examination: Secondary | ICD-10-CM | POA: Diagnosis not present

## 2021-08-19 HISTORY — DX: Anemia, unspecified: D64.9

## 2021-08-22 ENCOUNTER — Ambulatory Visit: Payer: 59 | Admitting: Cardiology

## 2021-08-25 MED ORDER — GENTAMICIN SULFATE 40 MG/ML IJ SOLN
5.0000 mg/kg | INTRAVENOUS | Status: AC
  Administered 2021-08-26: 369.2 mg via INTRAVENOUS
  Filled 2021-08-25: qty 9.25

## 2021-08-26 ENCOUNTER — Encounter (HOSPITAL_COMMUNITY)
Admission: RE | Disposition: A | Discharge status: Home / Self Care | Payer: Self-pay | Source: Home / Self Care | Attending: Urology

## 2021-08-26 ENCOUNTER — Observation Stay (HOSPITAL_COMMUNITY)
Admission: RE | Admit: 2021-08-26 | Discharge: 2021-08-27 | Disposition: A | Discharge status: Home / Self Care | Payer: Medicare HMO | Attending: Urology | Admitting: Urology

## 2021-08-26 ENCOUNTER — Ambulatory Visit (HOSPITAL_COMMUNITY): Payer: Medicare HMO | Admitting: Physician Assistant

## 2021-08-26 ENCOUNTER — Other Ambulatory Visit: Payer: Self-pay

## 2021-08-26 ENCOUNTER — Encounter (HOSPITAL_COMMUNITY): Payer: Self-pay | Admitting: Urology

## 2021-08-26 ENCOUNTER — Ambulatory Visit (HOSPITAL_BASED_OUTPATIENT_CLINIC_OR_DEPARTMENT_OTHER): Payer: Medicare HMO | Admitting: Certified Registered"

## 2021-08-26 DIAGNOSIS — Z87891 Personal history of nicotine dependence: Secondary | ICD-10-CM | POA: Insufficient documentation

## 2021-08-26 DIAGNOSIS — Z7982 Long term (current) use of aspirin: Secondary | ICD-10-CM | POA: Diagnosis not present

## 2021-08-26 DIAGNOSIS — R339 Retention of urine, unspecified: Secondary | ICD-10-CM

## 2021-08-26 DIAGNOSIS — D649 Anemia, unspecified: Secondary | ICD-10-CM | POA: Diagnosis not present

## 2021-08-26 DIAGNOSIS — N320 Bladder-neck obstruction: Secondary | ICD-10-CM | POA: Diagnosis not present

## 2021-08-26 DIAGNOSIS — I4891 Unspecified atrial fibrillation: Secondary | ICD-10-CM | POA: Diagnosis not present

## 2021-08-26 DIAGNOSIS — R338 Other retention of urine: Secondary | ICD-10-CM | POA: Insufficient documentation

## 2021-08-26 DIAGNOSIS — N200 Calculus of kidney: Secondary | ICD-10-CM | POA: Insufficient documentation

## 2021-08-26 DIAGNOSIS — I10 Essential (primary) hypertension: Secondary | ICD-10-CM | POA: Insufficient documentation

## 2021-08-26 DIAGNOSIS — Z79899 Other long term (current) drug therapy: Secondary | ICD-10-CM | POA: Insufficient documentation

## 2021-08-26 DIAGNOSIS — N401 Enlarged prostate with lower urinary tract symptoms: Principal | ICD-10-CM | POA: Insufficient documentation

## 2021-08-26 DIAGNOSIS — N138 Other obstructive and reflux uropathy: Secondary | ICD-10-CM | POA: Diagnosis not present

## 2021-08-26 DIAGNOSIS — M1990 Unspecified osteoarthritis, unspecified site: Secondary | ICD-10-CM | POA: Diagnosis not present

## 2021-08-26 HISTORY — PX: TRANSURETHRAL RESECTION OF PROSTATE: SHX73

## 2021-08-26 SURGERY — TURP (TRANSURETHRAL RESECTION OF PROSTATE)
Anesthesia: General

## 2021-08-26 MED ORDER — FENTANYL CITRATE (PF) 100 MCG/2ML IJ SOLN
INTRAMUSCULAR | Status: AC
  Filled 2021-08-26: qty 2

## 2021-08-26 MED ORDER — LACTATED RINGERS IV SOLN: INTRAVENOUS | Status: DC

## 2021-08-26 MED ORDER — PROPOFOL 10 MG/ML IV BOLUS
INTRAVENOUS | Status: AC
  Filled 2021-08-26: qty 20

## 2021-08-26 MED ORDER — SODIUM CHLORIDE 0.45 % IV SOLN: INTRAVENOUS | Status: DC

## 2021-08-26 MED ORDER — AMISULPRIDE (ANTIEMETIC) 5 MG/2ML IV SOLN
10.0000 mg | Freq: Once | INTRAVENOUS | Status: AC | PRN
  Administered 2021-08-26: 10 mg via INTRAVENOUS

## 2021-08-26 MED ORDER — HYDRALAZINE HCL 20 MG/ML IJ SOLN
INTRAMUSCULAR | Status: AC
  Filled 2021-08-26: qty 1

## 2021-08-26 MED ORDER — TRAMADOL HCL 50 MG PO TABS: 50.0000 mg | ORAL_TABLET | Freq: Four times a day (QID) | ORAL | Status: DC | PRN

## 2021-08-26 MED ORDER — DEXTROSE 50 % IV SOLN
INTRAVENOUS | Status: AC
  Filled 2021-08-26: qty 50

## 2021-08-26 MED ORDER — CEFAZOLIN SODIUM-DEXTROSE 1-4 GM/50ML-% IV SOLN
1.0000 g | Freq: Three times a day (TID) | INTRAVENOUS | Status: DC
  Administered 2021-08-26 – 2021-08-27 (×3): 1 g via INTRAVENOUS
  Filled 2021-08-26 (×4): qty 50

## 2021-08-26 MED ORDER — DEXAMETHASONE SODIUM PHOSPHATE 10 MG/ML IJ SOLN
INTRAMUSCULAR | Status: DC | PRN
  Administered 2021-08-26: 4 mg via INTRAVENOUS

## 2021-08-26 MED ORDER — AMIODARONE HCL 200 MG PO TABS
200.0000 mg | ORAL_TABLET | Freq: Every day | ORAL | Status: DC
  Administered 2021-08-27: 200 mg via ORAL
  Filled 2021-08-26: qty 1

## 2021-08-26 MED ORDER — EZETIMIBE 10 MG PO TABS
10.0000 mg | ORAL_TABLET | Freq: Every day | ORAL | Status: DC
  Administered 2021-08-27: 10 mg via ORAL
  Filled 2021-08-26: qty 1

## 2021-08-26 MED ORDER — EPHEDRINE 5 MG/ML INJ
INTRAVENOUS | Status: AC
  Filled 2021-08-26: qty 5

## 2021-08-26 MED ORDER — ENALAPRIL MALEATE 10 MG PO TABS
10.0000 mg | ORAL_TABLET | Freq: Every day | ORAL | Status: DC
  Administered 2021-08-27: 10 mg via ORAL
  Filled 2021-08-26 (×2): qty 1

## 2021-08-26 MED ORDER — HYDROMORPHONE HCL 1 MG/ML IJ SOLN
0.2500 mg | INTRAMUSCULAR | Status: DC | PRN
  Administered 2021-08-26: 0.5 mg via INTRAVENOUS

## 2021-08-26 MED ORDER — FENTANYL CITRATE (PF) 100 MCG/2ML IJ SOLN
INTRAMUSCULAR | Status: DC | PRN
  Administered 2021-08-26: 25 ug via INTRAVENOUS
  Administered 2021-08-26: 50 ug via INTRAVENOUS

## 2021-08-26 MED ORDER — LIDOCAINE 2% (20 MG/ML) 5 ML SYRINGE
INTRAMUSCULAR | Status: DC | PRN
  Administered 2021-08-26: 40 mg via INTRAVENOUS

## 2021-08-26 MED ORDER — PHENYLEPHRINE 80 MCG/ML (10ML) SYRINGE FOR IV PUSH (FOR BLOOD PRESSURE SUPPORT)
PREFILLED_SYRINGE | INTRAVENOUS | Status: AC
  Filled 2021-08-26: qty 10

## 2021-08-26 MED ORDER — SODIUM CHLORIDE 0.9 % IR SOLN: 3000.0000 mL | Status: DC

## 2021-08-26 MED ORDER — OXYCODONE HCL 5 MG/5ML PO SOLN: 5.0000 mg | Freq: Once | ORAL | Status: DC | PRN

## 2021-08-26 MED ORDER — CHLORHEXIDINE GLUCONATE 0.12 % MT SOLN
15.0000 mL | Freq: Once | OROMUCOSAL | Status: AC
  Administered 2021-08-26: 15 mL via OROMUCOSAL

## 2021-08-26 MED ORDER — PROPOFOL 10 MG/ML IV BOLUS
INTRAVENOUS | Status: AC
Start: 2021-08-26 — End: ?
  Filled 2021-08-26: qty 20

## 2021-08-26 MED ORDER — ONDANSETRON HCL 4 MG/2ML IJ SOLN
INTRAMUSCULAR | Status: DC | PRN
Start: 2021-08-26 — End: 2021-08-26
  Administered 2021-08-26: 4 mg via INTRAVENOUS

## 2021-08-26 MED ORDER — AMISULPRIDE (ANTIEMETIC) 5 MG/2ML IV SOLN
INTRAVENOUS | Status: AC
  Filled 2021-08-26: qty 4

## 2021-08-26 MED ORDER — ATENOLOL 50 MG PO TABS
50.0000 mg | ORAL_TABLET | Freq: Every morning | ORAL | Status: DC
  Administered 2021-08-27: 50 mg via ORAL
  Filled 2021-08-26: qty 1

## 2021-08-26 MED ORDER — PHENYLEPHRINE 80 MCG/ML (10ML) SYRINGE FOR IV PUSH (FOR BLOOD PRESSURE SUPPORT)
PREFILLED_SYRINGE | INTRAVENOUS | Status: DC | PRN
  Administered 2021-08-26 (×10): 80 ug via INTRAVENOUS

## 2021-08-26 MED ORDER — ONDANSETRON HCL 4 MG/2ML IJ SOLN
INTRAMUSCULAR | Status: AC
  Filled 2021-08-26: qty 2

## 2021-08-26 MED ORDER — OXYCODONE HCL 5 MG PO TABS: 5.0000 mg | ORAL_TABLET | Freq: Once | ORAL | Status: DC | PRN

## 2021-08-26 MED ORDER — HYDRALAZINE HCL 20 MG/ML IJ SOLN
10.0000 mg | Freq: Once | INTRAMUSCULAR | Status: AC
  Administered 2021-08-26: 10 mg via INTRAVENOUS

## 2021-08-26 MED ORDER — EPHEDRINE SULFATE-NACL 50-0.9 MG/10ML-% IV SOSY
PREFILLED_SYRINGE | INTRAVENOUS | Status: DC | PRN
  Administered 2021-08-26: 10 mg via INTRAVENOUS
  Administered 2021-08-26: 5 mg via INTRAVENOUS
  Administered 2021-08-26 (×2): 10 mg via INTRAVENOUS
  Administered 2021-08-26: 5 mg via INTRAVENOUS
  Administered 2021-08-26: 10 mg via INTRAVENOUS

## 2021-08-26 MED ORDER — SODIUM CHLORIDE 0.9 % IR SOLN
Status: DC | PRN
  Administered 2021-08-26: 30000 mL

## 2021-08-26 MED ORDER — DEXAMETHASONE SODIUM PHOSPHATE 10 MG/ML IJ SOLN
INTRAMUSCULAR | Status: AC
  Filled 2021-08-26: qty 1

## 2021-08-26 MED ORDER — PROMETHAZINE HCL 25 MG/ML IJ SOLN: 6.2500 mg | INTRAMUSCULAR | Status: DC | PRN

## 2021-08-26 MED ORDER — HYDROMORPHONE HCL 1 MG/ML IJ SOLN
INTRAMUSCULAR | Status: AC
  Filled 2021-08-26: qty 1

## 2021-08-26 MED ORDER — ORAL CARE MOUTH RINSE: 15.0000 mL | Freq: Once | OROMUCOSAL | Status: AC

## 2021-08-26 MED ORDER — PROPOFOL 10 MG/ML IV BOLUS
INTRAVENOUS | Status: DC | PRN
  Administered 2021-08-26: 30 mg via INTRAVENOUS
  Administered 2021-08-26: 120 mg via INTRAVENOUS

## 2021-08-26 MED ORDER — ONDANSETRON HCL 4 MG/2ML IJ SOLN
4.0000 mg | Freq: Four times a day (QID) | INTRAMUSCULAR | Status: DC | PRN
  Administered 2021-08-26: 4 mg via INTRAVENOUS
  Filled 2021-08-26: qty 2

## 2021-08-26 SURGICAL SUPPLY — 20 items
BAG URINE DRAIN 2000ML AR STRL (UROLOGICAL SUPPLIES) ×2 IMPLANT
BAG URO CATCHER STRL LF (MISCELLANEOUS) ×2 IMPLANT
CATH FOLEY 3WAY 30CC 22FR (CATHETERS) IMPLANT
CATH FOLEY 3WAY 30CC 24FR (CATHETERS) ×2
CATH URTH STD 24FR FL 3W 2 (CATHETERS) IMPLANT
DRAPE FOOT SWITCH (DRAPES) ×2 IMPLANT
ELECT REM PT RETURN 15FT ADLT (MISCELLANEOUS) ×1 IMPLANT
GLOVE BIO SURGEON STRL SZ7.5 (GLOVE) ×2 IMPLANT
GOWN STRL REUS W/ TWL XL LVL3 (GOWN DISPOSABLE) ×1 IMPLANT
GOWN STRL REUS W/TWL XL LVL3 (GOWN DISPOSABLE) ×2
HOLDER FOLEY CATH W/STRAP (MISCELLANEOUS) IMPLANT
KIT TURNOVER KIT A (KITS) IMPLANT
LOOP CUT BIPOLAR 24F LRG (ELECTROSURGICAL) ×1 IMPLANT
MANIFOLD NEPTUNE II (INSTRUMENTS) ×2 IMPLANT
PACK CYSTO (CUSTOM PROCEDURE TRAY) ×2 IMPLANT
SYR 30ML LL (SYRINGE) ×1 IMPLANT
SYR TOOMEY IRRIG 70ML (MISCELLANEOUS) ×2
SYRINGE TOOMEY IRRIG 70ML (MISCELLANEOUS) ×1 IMPLANT
TUBING CONNECTING 10 (TUBING) ×2 IMPLANT
TUBING UROLOGY SET (TUBING) ×2 IMPLANT

## 2021-08-26 NOTE — Op Note (Signed)
Preoperative diagnosis:  Bladder outlet obstruction Urinary retention  Postoperative diagnosis:  same   Procedure:  Transurethral resection of prostate  Surgeon: Ardis Hughs, MD   Anesthesia: General   Complications: None   Intraoperative findings: Cystoscopy demonstrated a normal appearing bladder mucosa without tumor/stones/or abnormalities.  UOs were orthopic.  Prostate demonstrated a large median lobe with lateral lobe obstruction.  EBL: 100cc   Specimens: Prostate chips  Indication: Joshua Alvarez is a 76 y.o. patient with bladder outlet obstruction/urinary retention/obstructive voiding symptoms.  After reviewing the management options for treatment, he elected to proceed with the above surgical procedure(s). We have discussed the potential benefits and risks of the procedure, side effects of the proposed treatment, the likelihood of the patient achieving the goals of the procedure, and any potential problems that might occur during the procedure or recuperation. Informed consent has been obtained.  Description of procedure:  The patient was taken to the operating room and general anesthesia was induced. The patient was placed in the dorsal lithotomy position, prepped and draped in the usual sterile fashion, and preoperative antibiotics were administered. A preoperative time-out was performed.   I then gently passed the 21 French 30 cystoscope into the patient's urethra and up into the bladder under visual guidance. A 360 cystoscopic evaluation was then performed with the above findings.  I then removed the 21 French cystoscopic sheath and replacement with a 26 French resectoscope sheath was passed and using the visual obturator under direct vision. An exchange the obturator for the resectoscope itself and the loop cautery. I then proceeded to create grooves within the prostate at the 7:00 position extending the defect from the bladder neck at 7:00 down to the prostate apex  just lateral to the verumontanum. I took this groove down to the capsule. I then performed a similar maneuver on the patient's left side at the 5:00 position taking the prostate down from the bladder neck to the apex and down the prostatic capsule. At this point I proceeded to resect the patient's right lateral lobe in a systematic fashion moving from the 7:00 position up to approximately 11. The resection was taken down to the prostate capsule. Hemostasis was achieved on this side prior to moving to the patient's left lateral lobe. A similar sequence was performed taking the prostate down from the 5:00 position to approximately the 1:00 position to the level of the prostatic capsule. I then completed the resection of the posterior wall as well as the area between 5:00 and 7:00 along the bladder neck. I was careful not to undermine the bladder neck or resect the inferior. I then did resect the area that was protruding into the prostatic urethra anteriorly.   Once I was satisfied that the prostate had been adequately resected I evacuated the prostate chips using a [place] syringe. I then reintroduced the resectoscope and ensured adequate hemostasis. I then left the bladder full and removed the resectoscope entirely. Exam under anesthesia demonstrated a normal sized prostate with no nodules.  I then placed a 24 French three-way Foley catheter. I irrigated the catheter gently and removed all debris and clots. I then placed the patient on gentle continuous bladder irrigation.  He subsequently extubated and returned to PACU in excellent condition.  Ardis Hughs, MD

## 2021-08-26 NOTE — Discharge Instructions (Signed)
Transurethral Resection of the Prostate (TURP) or Greenlight laser ablation of the Prostate ° °Care After ° °Refer to this sheet in the next few weeks. These discharge instructions provide you with general information on caring for yourself after you leave the hospital. Your caregiver may also give you specific instructions. Your treatment has been planned according to the most current medical practices available, but unavoidable complications sometimes occur. If you have any problems or questions after discharge, please call your caregiver. ° °HOME CARE INSTRUCTIONS  ° °Medications °· You may receive medicine for pain management. As your level of discomfort decreases, adjustments in your pain medicines may be made.  °· Take all medicines as directed.  °· You may be given a medicine (antibiotic) to kill germs following surgery. Finish all medicines. Let your caregiver know if you have any side effects or problems from the medicine.  °· If you are on aspirin, it would be best not to restart the aspirin until the blood in the urine clears °Hygiene °· You can take a shower after surgery.  °· You should not take a bath while you still have the urethral catheter. °Activity °· You will be encouraged to get out of bed as much as possible and increase your activity level as tolerated.  °· Spend the first week in and around your home. For 3 weeks, avoid the following:  °· Straining.  °· Running.  °· Strenuous work.  °· Walks longer than a few blocks.  °· Riding for extended periods.  °· Sexual relations.  °· Do not lift heavy objects (more than 20 pounds) for at least 1 month. When lifting, use your arms instead of your abdominal muscles.  °· You will be encouraged to walk as tolerated. Do not exert yourself. Increase your activity level slowly. Remember that it is important to keep moving after an operation of any type. This cuts down on the possibility of developing blood clots.  °· Your caregiver will tell you when you  can resume driving and light housework. Discuss this at your first office visit after discharge. °Diet °· No special diet is ordered after a TURP. However, if you are on a special diet for another medical problem, it should be continued.  °· Normal fluid intake is usually recommended.  °· Avoid alcohol and caffeinated drinks for 2 weeks. They irritate the bladder. Decaffeinated drinks are okay.  °· Avoid spicy foods.  °Bladder Function °· For the first 10 days, empty the bladder whenever you feel a definite desire. Do not try to hold the urine for long periods of time.  °· Urinating once or twice a night even after you are healed is not uncommon.  °· You may see some recurrence of blood in the urine after discharge from the hospital. This usually happens within 2 weeks after the procedure.If this occurs, force fluids again as you did in the hospital and reduce your activity.  °Bowel Function °· You may experience some constipation after surgery. This can be minimized by increasing fluids and fiber in your diet. Drink enough water and fluids to keep your urine clear or pale yellow.  °· A stool softener may be prescribed for use at home. Do not strain to move your bowels.  °· If you are requiring increased pain medicine, it is important that you take stool softeners to prevent constipation. This will help to promote proper healing by reducing the need to strain to move your bowels.  °Sexual Activity °· Semen movement   in the opposite direction and into the bladder (retrograde ejaculation) may occur. Since the semen passes into the bladder, cloudy urine can occur the first time you urinate after intercourse. Or, you may not have an ejaculation during erection. Ask your caregiver when you can resume sexual activity. Retrograde ejaculation and reduced semen discharge should not reduce one's pleasure of intercourse.  °Postoperative Visit °· Arrange the date and time of your after surgery visit with your caregiver.  °Return  to Work °· After your recovery is complete, you will be able to return to work and resume all activities. Your caregiver will inform you when you can return to work.  ° ° °Foley Catheter Care °A soft, flexible tube (Foley catheter) may have been placed in your bladder to drain urine and fluid. Follow these instructions: °Taking Care of the Catheter °· Keep the area where the catheter leaves your body clean.  °· Attach the catheter to the leg so there is no tension on the catheter.  °· Keep the drainage bag below the level of the bladder, but keep it OFF the floor.  °· Do not take long soaking baths. Your caregiver will give instructions about showering.  °· Wash your hands before touching ANYTHING related to the catheter or bag.  °· Using mild soap and warm water on a washcloth:  °· Clean the area closest to the catheter insertion site using a circular motion around the catheter.  °· Clean the catheter itself by wiping AWAY from the insertion site for several inches down the tube.  °· NEVER wipe upward as this could sweep bacteria up into the urethra (tube in your body that normally drains the bladder) and cause infection.  °· Place a Pankonin amount of sterile lubricant at the tip of the penis where the catheter is entering.  °Taking Care of the Drainage Bags °· Two drainage bags may be taken home: a large overnight drainage bag, and a smaller leg bag which fits underneath clothing.  °· It is okay to wear the overnight bag at any time, but NEVER wear the smaller leg bag at night.  °· Keep the drainage bag well below the level of your bladder. This prevents backflow of urine into the bladder and allows the urine to drain freely.  °· Anchor the tubing to your leg to prevent pulling or tension on the catheter. Use tape or a leg strap provided by the hospital.  °· Empty the drainage bag when it is 1/2 to 3/4 full. Wash your hands before and after touching the bag.  °· Periodically check the tubing for kinks to make sure  there is no pressure on the tubing which could restrict the flow of urine.  °Changing the Drainage Bags °· Cleanse both ends of the clean bag with alcohol before changing.  °· Pinch off the rubber catheter to avoid urine spillage during the disconnection.  °· Disconnect the dirty bag and connect the clean one.  °· Empty the dirty bag carefully to avoid a urine spill.  °· Attach the new bag to the leg with tape or a leg strap.  °Cleaning the Drainage Bags °· Whenever a drainage bag is disconnected, it must be cleaned quickly so it is ready for the next use.  °· Wash the bag in warm, soapy water.  °· Rinse the bag thoroughly with warm water.  °· Soak the bag for 30 minutes in a solution of white vinegar and water (1 cup vinegar to 1 quart warm   water).  °· Rinse with warm water.  °SEEK MEDICAL CARE IF:  °· You have chills or night sweats.  °· You are leaking around your catheter or have problems with your catheter. It is not uncommon to have sporadic leakage around your catheter as a result of bladder spasms. If the leakage stops, there is not much need for concern. If you are uncertain, call your caregiver.  °· You develop side effects that you think are coming from your medicines.  °SEEK IMMEDIATE MEDICAL CARE IF:  °· You are suddenly unable to urinate. Check to see if there are any kinks in the drainage tubing that may cause this. If you cannot find any kinks, call your caregiver immediately. This is an emergency.  °· You develop shortness of breath or chest pains.  °· Bleeding persists or clots develop in your urine.  °· You have a fever.  °· You develop pain in your back or over your lower belly (abdomen).  °· You develop pain or swelling in your legs.  °· Any problems you are having get worse rather than better.  °MAKE SURE YOU:  °· Understand these instructions.  °· Will watch your condition.  °· Will get help right away if you are not doing well or get worse.  ° ° °

## 2021-08-26 NOTE — Interval H&P Note (Signed)
History and Physical Interval Note:  08/26/2021 8:49 AM  Joshua Alvarez  has presented today for surgery, with the diagnosis of URINARY RETENTION.  The various methods of treatment have been discussed with the patient and family. After consideration of risks, benefits and other options for treatment, the patient has consented to  Procedure(s): TRANSURETHRAL RESECTION OF THE PROSTATE (TURP) (N/A) as a surgical intervention.  The patient's history has been reviewed, patient examined, no change in status, stable for surgery.  I have reviewed the patient's chart and labs.  Questions were answered to the patient's satisfaction.     Ardis Hughs

## 2021-08-26 NOTE — H&P (Signed)
76 year old male who presents today for reevaluation. He is status post right ureteroscopy for a large UPJ stone. Following the operation he developed urinary retention. He subsequently failed 3 voiding trials. At times he is having retention of 1500 cc. At the time of his operation it was noted that he had a very large median lobe.   The patient underwent urine MX prior to his appointment today. This demonstrated a fairly [place] functional capacity bladder and a reasonable detrusor function but a obstructive prostate.   The patient denies any ongoing hematuria. He is tolerating the catheter well. He is eager to get rid of the Foley catheter and is anxious to discuss that today.   Interval: Today the patient follows up with a ultrasound prior to his appointment. This was in follow-up from his ureteroscopy. The patient continues to tolerate the catheter well. He is scheduled for surgery in about 6 weeks to have his Foley catheter removed. He is anxious to have it removed.     ALLERGIES: No Allergies    MEDICATIONS: Finasteride 5 mg tablet 1 tablet PO Daily  Tamsulosin Hcl 0.4 mg capsule 1 capsule PO Daily  Aspirin Ec 81 mg tablet, delayed release  Atenolol 50 MG Oral Tablet Oral  Atorvastatin Calcium 20 MG Oral Tablet Oral  Enalapril Maleate 10 mg tablet Oral  HydroCHLOROthiazide 25 MG Oral Tablet Oral  Viagra 100 mg tablet 0 Oral  Zetia     GU PSH: Complex cystometrogram, w/ void pressure and urethral pressure profile studies, any technique - 07/01/2021 Complex Uroflow - 07/01/2021 Cystolithotomy - 2011 Cystoscopy And Treatment - 06/15/2021 Emg surf Electrd - 07/01/2021 ESWL - 2012 Inject For cystogram - 07/01/2021 Intrabd voidng Press - 07/01/2021 Prostate Needle Biopsy - 2017 Ureteroscopic laser litho - 06/15/2021 Vasectomy - 2011       PSH Notes: Knee Surgery, Lithotripsy, Bladder Cystotomy With Direct Removal Of Calculus, Surgery Of Male Genitalia Vasectomy, Inguinal Hernia Repair    NON-GU PSH: Hernia Repair, 05/2020 Surgical Pathology, Gross And Microscopic Examination For Prostate Needle - 2017     GU PMH: Urinary Retention - 07/12/2021, - 07/01/2021 (Stable), - 06/24/2021 (Stable), - 06/23/2021, - 06/17/2021 BPH w/LUTS, He is back in retention today with a PVR of 1543m. I am going to add finasteride to the tamsulosin and reviewed the side effects. He will be set up for urodynamics in 2 weeks and then f/u with Dr. HLouis Meckelin 3 weeks. - 06/24/2021, - 03/23/2021, - 01/04/2021, Benign localized prostatic hyperplasia with lower urinary tract symptoms (LUTS), - 2016 Paraphimosis - 06/23/2021, - 06/17/2021 Ureteral calculus - 06/23/2021, - 03/23/2021 Bladder Stone - 05/09/2021, - 03/29/2021 Renal calculus - 05/09/2021, - 03/29/2021, Bilateral kidney stones, - 2017, Nephrolithiasis, - 2016 Renal cyst - 03/29/2021 Elevated PSA - 01/04/2021, - 12/04/2019, - 2019, - 2018, - 2018, - 2017, Elevated prostate specific antigen (PSA), - 2017 Urinary Urgency - 01/04/2021, (Stable), - 12/04/2019, Urinary urgency, - 2015 Urinary Hesitancy - 2018 Urinary Frequency, Increased urinary frequency - [facility], unspecified, Organic impotence - 2015 BPH w/o LUTS, Benign prostatic hypertrophy without lower urinary tract symptoms - 2015 Dysuria, Dysuria - 2014 History of urolithiasis, Nephrolithiasis - 2014 Other microscopic hematuria, Microscopic Hematuria - 2014 Urge incontinence, Urge Incontinence Of Urine - 2014      PMH Notes: Nephrolithiasis:  Currently: Kcit 10 meq qday  KUB 1/15 - bilateral nephrolithiasis L>R   2010-05-31 14:10:20 - Note: Bladder Calculus   NON-GU PMH: Encounter for general adult medical examination without abnormal  findings, Encounter for preventive health examination - 07-12-2013 Personal history of other diseases of the circulatory system, History of hypertension - 12-Jul-2012 Personal history of other endocrine, nutritional and metabolic disease, History of hyperkalemia -  07-12-12 Personal history of other specified conditions, History of urinary frequency - 2012-07-12    FAMILY HISTORY: Death In The Family Father - Runs In Family Death In The Family Mother - Runs In Family   SOCIAL HISTORY: Marital Status: Married Preferred Language: English; Ethnicity: Not Hispanic Or Latino; Race: White     Notes: Number of children, Caffeine Use, Retired, Former smoker, Alcohol Use, Marital History - Currently Married   REVIEW OF SYSTEMS:    GU Review Male:   Patient denies stream starts and stops, trouble starting your stream, burning/ pain with urination, erection problems, hard to postpone urination, frequent urination, leakage of urine, have to strain to urinate , get up at night to urinate, and penile pain.  Gastrointestinal (Upper):   Patient denies nausea, vomiting, and indigestion/ heartburn.  Gastrointestinal (Lower):   Patient denies diarrhea and constipation.  Constitutional:   Patient denies fever, night sweats, weight loss, and fatigue.  Skin:   Patient denies skin rash/ lesion and itching.  Eyes:   Patient denies blurred vision and double vision.  Ears/ Nose/ Throat:   Patient denies sore throat and sinus problems.  Hematologic/Lymphatic:   Patient denies swollen glands and easy bruising.  Cardiovascular:   Patient denies leg swelling and chest pains.  Respiratory:   Patient denies cough and shortness of breath.  Endocrine:   Patient denies excessive thirst.  Musculoskeletal:   Patient denies back pain and joint pain.  Neurological:   Patient denies headaches and dizziness.  Psychologic:   Patient denies depression and anxiety.   VITAL SIGNS:      07/26/2021 01:44 PM  BP 106/69 mmHg  Pulse 97 /min  Temperature 97.5 F / 36.3 C   Complexity of Data:  Source Of History:  Patient  Lab Test Review:   PSA  Records Review:   Previous Doctor Records, Previous Patient Records, POC Tool  Urine Test Review:   Urinalysis  X-Ray Review: Prostate Ultrasound: Reviewed  Films. Discussed With Patient.     12/28/20 06/07/20 12/04/19 09/27/17 03/22/17 09/26/16 02/14/16 07/09/15  PSA  Total PSA 4.32 ng/mL 4.22 ng/mL 5.90 ng/mL 4.27 ng/mL 4.34 ng/mL 5.01 ng/mL 6.46  4.31   Free PSA   1.05 ng/mL 0.78 ng/mL 0.70 ng/mL  0.84  1.10   % Free PSA   18 % PSA 18 % PSA 16 % PSA  13  26     PROCEDURES:         Renal Ultrasound - 76283  Right kidney  Length: 11.4 cm Depth: 5.0 cm Cortical Width: 1.5 cm Width:4.7 cm  Left Kidney Length:10.7 cm Depth: 4.8 cm Cortical Width: 1.6 cm Width: 4.8 cm   Left Kidney/Ureter:  Multiple stones, largest= 0.89cm UP.   Right Kidney/Ureter:  Multiple stones, largest= 1.2cm, ? Septated cystic area MP= 1.8x1.8x1.6cm, Fullness noted.  Bladder:  Not seen. Cath in.      Patient confirmed No Neulasta OnPro Device.     ASSESSMENT:      ICD-10 Details  1 GU:   BPH w/LUTS - N40.1   2   Renal calculus - N20.0   3   Urinary Retention - R33.8      PLAN:            Medications Stop Meds: Percocet 5  mg-325 mg tablet 1-2 tablet PO Q 6 H PRN  Start: 03/23/2021  Discontinue: 07/26/2021  - Reason: The medication cycle was completed.  Ondansetron Hcl 4 mg tablet 1 tablet PO Q 8 H PRN  Start: 03/23/2021  Discontinue: 07/26/2021  - Reason: The medication cycle was completed.            Document Letter(s):  Created for Patient: Clinical Summary         Notes:   The patient had no hydro in his ultrasound today. There is good news. Is very reassuring. We discussed stone prevention, recommended water [place]. However, the patient's biggest concern today is a Foley catheter. He is scheduled for TURP in the next 6 weeks. However prior to that he would like to have his Foley catheter removed. I recommended that we have him follow-up with our nurse so that he can learn clean intermittent catheterization. I told him that he have to cath 4 or 5 times daily prior to his surgery.

## 2021-08-26 NOTE — Anesthesia Procedure Notes (Signed)
Procedure Name: LMA Insertion Date/Time: 08/26/2021 9:19 AM Performed by: Eben Burow, CRNA Pre-anesthesia Checklist: Patient identified, Emergency Drugs available, Suction available, Patient being monitored and Timeout performed Patient Re-evaluated:Patient Re-evaluated prior to induction Oxygen Delivery Method: Circle system utilized Preoxygenation: Pre-oxygenation with 100% oxygen Induction Type: IV induction Ventilation: Mask ventilation without difficulty LMA: LMA inserted LMA Size: 4.0 Number of attempts: 1 Tube secured with: Tape Dental Injury: Teeth and Oropharynx as per pre-operative assessment

## 2021-08-26 NOTE — Transfer of Care (Signed)
Immediate Anesthesia Transfer of Care Note  Patient: Joshua Alvarez  Procedure(s) Performed: TRANSURETHRAL RESECTION OF THE PROSTATE (TURP)  Patient Location: PACU  Anesthesia Type:General  Level of Consciousness: awake, alert  and patient cooperative  Airway & Oxygen Therapy: Patient Spontanous Breathing and Patient connected to face mask oxygen  Post-op Assessment: Report given to RN and Post -op Vital signs reviewed and stable  Post vital signs: Reviewed and stable  Last Vitals:  Vitals Value Taken Time  BP    Temp    Pulse    Resp    SpO2      Last Pain:  Vitals:   08/26/21 0736  TempSrc:   PainSc: 0-No pain         Complications: No notable events documented.

## 2021-08-26 NOTE — Anesthesia Preprocedure Evaluation (Signed)
Anesthesia Evaluation  Patient identified by MRN, date of birth, ID band Patient awake    Reviewed: Allergy & Precautions, NPO status , Patient's Chart, lab work & pertinent test results  Airway Mallampati: II  TM Distance: >3 FB Neck ROM: Full    Dental no notable dental hx.    Pulmonary neg pulmonary ROS, former smoker,    Pulmonary exam normal breath sounds clear to auscultation       Cardiovascular hypertension, Pt. on medications Normal cardiovascular exam+ dysrhythmias Atrial Fibrillation  Rhythm:Regular Rate:Normal     Neuro/Psych  Headaches, negative psych ROS   GI/Hepatic negative GI ROS, Neg liver ROS,   Endo/Other  negative endocrine ROS  Renal/GU negative Renal ROS  negative genitourinary   Musculoskeletal  (+) Arthritis , Osteoarthritis,    Abdominal   Peds negative pediatric ROS (+)  Hematology  (+) Blood dyscrasia, anemia ,   Anesthesia Other Findings   Reproductive/Obstetrics negative OB ROS                             Anesthesia Physical Anesthesia Plan  ASA: 3  Anesthesia Plan: General   Post-op Pain Management: Dilaudid IV   Induction: Intravenous  PONV Risk Score and Plan: 2 and Ondansetron, Midazolam and Treatment may vary due to age or medical condition  Airway Management Planned: LMA  Additional Equipment:   Intra-op Plan:   Post-operative Plan: Extubation in OR  Informed Consent: I have reviewed the patients History and Physical, chart, labs and discussed the procedure including the risks, benefits and alternatives for the proposed anesthesia with the patient or authorized representative who has indicated his/her understanding and acceptance.     Dental advisory given  Plan Discussed with: CRNA  Anesthesia Plan Comments:         Anesthesia Quick Evaluation

## 2021-08-26 NOTE — Anesthesia Postprocedure Evaluation (Signed)
Anesthesia Post Note  Patient: Joshua Alvarez  Procedure(s) Performed: TRANSURETHRAL RESECTION OF THE PROSTATE (TURP)     Patient location during evaluation: PACU Anesthesia Type: General Level of consciousness: awake and alert Pain management: pain level controlled Vital Signs Assessment: post-procedure vital signs reviewed and stable Respiratory status: spontaneous breathing, nonlabored ventilation and respiratory function stable Cardiovascular status: blood pressure returned to baseline and stable Postop Assessment: no apparent nausea or vomiting Anesthetic complications: no   No notable events documented.  Last Vitals:  Vitals:   08/26/21 1136 08/26/21 1151  BP: 138/81 136/83  Pulse: (!) 55 (!) 57  Resp: 19 (!) 25  Temp:  36.4 C  SpO2: 100% 100%    Last Pain:  Vitals:   08/26/21 1151  TempSrc:   PainSc: 0-No pain                 Lynda Rainwater

## 2021-08-27 ENCOUNTER — Encounter (HOSPITAL_COMMUNITY): Payer: Self-pay | Admitting: Urology

## 2021-08-27 DIAGNOSIS — N200 Calculus of kidney: Secondary | ICD-10-CM | POA: Diagnosis not present

## 2021-08-27 DIAGNOSIS — R339 Retention of urine, unspecified: Secondary | ICD-10-CM | POA: Diagnosis not present

## 2021-08-27 DIAGNOSIS — R338 Other retention of urine: Secondary | ICD-10-CM | POA: Diagnosis not present

## 2021-08-27 DIAGNOSIS — N401 Enlarged prostate with lower urinary tract symptoms: Secondary | ICD-10-CM | POA: Diagnosis not present

## 2021-08-27 DIAGNOSIS — Z79899 Other long term (current) drug therapy: Secondary | ICD-10-CM | POA: Diagnosis not present

## 2021-08-27 DIAGNOSIS — Z7982 Long term (current) use of aspirin: Secondary | ICD-10-CM | POA: Diagnosis not present

## 2021-08-27 DIAGNOSIS — N320 Bladder-neck obstruction: Secondary | ICD-10-CM | POA: Diagnosis not present

## 2021-08-27 DIAGNOSIS — I10 Essential (primary) hypertension: Secondary | ICD-10-CM | POA: Diagnosis not present

## 2021-08-27 DIAGNOSIS — Z87891 Personal history of nicotine dependence: Secondary | ICD-10-CM | POA: Diagnosis not present

## 2021-08-27 NOTE — Discharge Summary (Signed)
Physician Discharge Summary  Patient ID: Joshua Alvarez MRN: [id number] DOB/AGE: [birth year] 76 y.o.  Admit date: 08/26/2021 Discharge date: 08/27/2021  Admission Diagnoses:  Discharge Diagnoses:  Principal Problem:   Urinary retention   Discharged Condition: good  Hospital Course: Patient underwent a TURP.  He remained overnight under observation.  He was weaned off CBI.  He underwent voiding trial and was voiding well with light red urine the following day.  He did have an episode of low oxygen saturation overnight that responded to nasal cannula.  Was 99% on room air this morning.  Consults: None  Significant Diagnostic Studies: None  Treatments: surgery: TURP  Discharge Exam: Blood pressure 115/71, pulse 62, temperature 97.6 F (36.4 C), temperature source Oral, resp. rate 18, height '5\' 7"'$  (1.702 m), weight 74.2 kg, SpO2 99 %. General appearance: alert no acute distress Abdomen soft, nontender, nondistended Urine in the urinal light red  Disposition: Discharge disposition: 01-Home or Self Care       Discharge Instructions     No wound care   Complete by: As directed       Allergies as of 08/27/2021       Reactions   Statins Other (See Comments)   Muscle aches joint pain unable to take any statins have tried several   Clarithromycin Swelling   Swelling face and in mouth        Medication List     STOP taking these medications    cephALEXin 500 MG capsule Commonly known as: KEFLEX   finasteride 5 MG tablet Commonly known as: PROSCAR   oxybutynin 5 MG tablet Commonly known as: DITROPAN   tamsulosin 0.4 MG Caps capsule Commonly known as: FLOMAX       TAKE these medications    amiodarone 200 MG tablet Commonly known as: PACERONE Take 1 tablet (200 mg total) by mouth daily for 21 days.   atenolol 50 MG tablet Commonly known as: TENORMIN Take 1 tablet (50 mg total) by mouth every morning.   enalapril 10 MG tablet Commonly known as:  VASOTEC Take 1 tablet (10 mg total) by mouth daily. What changed: when to take this   ezetimibe 10 MG tablet Commonly known as: Zetia Take 1 tablet (10 mg total) by mouth daily.        Follow-up Information     Karen Kays, NP Follow up in 2 week(s).   Specialty: Nurse Practitioner Contact information: [address] [place] [address] [phone]                 Signed: Marton Redwood, III 08/27/2021, 10:54 AM

## 2021-08-27 NOTE — Progress Notes (Signed)
Pt was given and explained discharge instructions. IV removed. Tele removed. Pt dressed in personal clothing and taken to the main entrance via wheelchair.

## 2021-08-27 NOTE — Progress Notes (Signed)
Pt O2 saturation dropped to 81% on Ra while asleep. Pt placed on prn 2L O2. Now sating at 100%. Pt in no acute distress.Will continue to monitor pt.

## 2021-08-29 LAB — SURGICAL PATHOLOGY
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
## 2021-09-06 ENCOUNTER — Encounter: Payer: Self-pay | Admitting: Physician Assistant

## 2021-09-06 ENCOUNTER — Ambulatory Visit: Payer: Medicare HMO | Admitting: Physician Assistant

## 2021-09-06 VITALS — BP 110/76 | HR 55 | Ht 67.0 in | Wt 164.2 lb

## 2021-09-06 DIAGNOSIS — I10 Essential (primary) hypertension: Secondary | ICD-10-CM

## 2021-09-06 DIAGNOSIS — E785 Hyperlipidemia, unspecified: Secondary | ICD-10-CM | POA: Diagnosis not present

## 2021-09-06 DIAGNOSIS — I480 Paroxysmal atrial fibrillation: Secondary | ICD-10-CM

## 2021-09-06 DIAGNOSIS — R220 Localized swelling, mass and lump, head: Secondary | ICD-10-CM

## 2021-09-06 MED ORDER — AMLODIPINE BESYLATE 5 MG PO TABS: 2.5000 mg | ORAL_TABLET | Freq: Every day | ORAL | 3 refills | Status: DC

## 2021-09-06 NOTE — Patient Instructions (Signed)
Medication Instructions:  Your physician has recommended you make the following change in your medication:   DISCONTINUE: Enalapril and Zetia START: Amlodipine '5mg'$  1/2 tablet daily  *If you need a refill on your cardiac medications before your next appointment, please call your pharmacy*   Lab Work: None If you have labs (blood work) drawn today and your tests are completely normal, you will receive your results only by: [place] (if you have MyChart) OR A paper copy in the mail If you have any lab test that is abnormal or we need to change your treatment, we will call you to review the results.   Follow-Up: At [facility], you and your health needs are our priority.  As part of our continuing mission to provide you with exceptional heart care, we have created designated Provider Care Teams.  These Care Teams include your primary Cardiologist (physician) and Advanced Practice Providers (APPs -  Physician Assistants and Nurse Practitioners) who all work together to provide you with the care you need, when you need it.   Your next appointment:   1st available (within 6 weeks)  The format for your next appointment:   In Person  Provider:   Evon Slack, MD

## 2021-09-09 DIAGNOSIS — R338 Other retention of urine: Secondary | ICD-10-CM | POA: Diagnosis not present

## 2021-09-14 ENCOUNTER — Ambulatory Visit: Payer: Medicare HMO | Admitting: Internal Medicine

## 2021-09-14 ENCOUNTER — Encounter: Payer: Self-pay | Admitting: Internal Medicine

## 2021-09-14 VITALS — BP 128/68 | HR 54 | Ht 67.0 in | Wt 165.0 lb

## 2021-09-14 DIAGNOSIS — I480 Paroxysmal atrial fibrillation: Secondary | ICD-10-CM | POA: Diagnosis not present

## 2021-09-14 DIAGNOSIS — I10 Essential (primary) hypertension: Secondary | ICD-10-CM

## 2021-09-14 DIAGNOSIS — Z79899 Other long term (current) drug therapy: Secondary | ICD-10-CM | POA: Diagnosis not present

## 2021-09-14 DIAGNOSIS — R319 Hematuria, unspecified: Secondary | ICD-10-CM | POA: Diagnosis not present

## 2021-09-14 NOTE — Progress Notes (Signed)
Cardiology Office Note:    Date:  09/14/2021   ID:  TOA [name], DOB Sep 07, 1945, MRN [id number]  PCP:  Coral Spikes, DO   [facility] Providers Cardiologist:  Rozann Lesches, MD     Referring MD: Coral Spikes, DO   CC:  DOD AF  History of Present Illness:    Joshua Alvarez is a 76 y.o. male with a hx of HTN, HLD with statin intolerance PAF, on eliquis and atenolol, who had AF RVR in the setting of hemorrhagic shock.  Was placed on short course of amiodarone.  Did not restart eliquis in hospital /fu.  Despite CHADVASC 3-4.  Patient notes that he is doing good.   Since last visit notes that he has been taking it easy. Was told by Dr. Louis Meckel that he would some bleeding for a couple of weeks. Had cough that resolved off enalapril There are no interval hospital/ED visit.    No chest pain or pressure .  No SOB/DOE and no PND/Orthopnea.  No weight gain or leg swelling.  No palpitations or syncope.  Feels heart fullness and anxiety when he is in atrial fibrillation.  He is doing to the [facility] on 09/24/21 for the week and would like to work around this for his care.   Past Medical History:  Diagnosis Date   Allergic rhinitis    Anemia    Arthritis    Bladder spasms    Colon polyp    History of kidney stones    x 3    Hypertension    Migraine headache    Paroxysmal atrial fibrillation [facility])    Diagnosed April 2023   Skin cancer    Urgency of urination     Past Surgical History:  Procedure Laterality Date   COLONOSCOPY     COLONOSCOPY N/A 02/11/2014   Procedure: COLONOSCOPY;  Surgeon: Rogene Houston, MD;  [facility];  Service: Endoscopy;  Laterality: N/A;  730   CYSTOSCOPY     x 2 - stone removal   FINGER SURGERY Right    right 5th finger   INGUINAL HERNIA REPAIR Bilateral    INSERTION OF MESH Left 05/04/2020   Procedure: INSERTION OF MESH;  Surgeon: Ralene Ok, MD;  Location: [place];  Service: General;  Laterality: Left;   RIGHT KNEE REPAIRED      TRANSURETHRAL RESECTION OF PROSTATE N/A 08/26/2021   Procedure: TRANSURETHRAL RESECTION OF THE PROSTATE (TURP);  Surgeon: Ardis Hughs, MD;  [facility];  Service: Urology;  Laterality: N/A;   XI ROBOTIC ASSISTED INGUINAL HERNIA REPAIR WITH MESH Left 05/04/2020   Procedure: XI ROBOTIC ASSISTED LEFT INGUINAL HERNIA REPAIR WITH MESH;  Surgeon: Ralene Ok, MD;  Location: [place];  Service: General;  Laterality: Left;    Current Medications: Current Meds  Medication Sig   amLODipine (NORVASC) 5 MG tablet Take 0.5 tablets (2.5 mg total) by mouth daily.   atenolol (TENORMIN) 50 MG tablet Take 1 tablet (50 mg total) by mouth every morning.   [DISCONTINUED] amiodarone (PACERONE) 200 MG tablet Take 1 tablet (200 mg total) by mouth daily for 21 days.     Allergies:   Statins, Clarithromycin, and Enalapril   Social History   Socioeconomic History   Marital status: Married    Spouse name: Joshua Alvarez   Number of children: 2   Years of education: Not on file   Highest education level: Not on file  Occupational History   Not on  file  Tobacco Use   Smoking status: Former    Years: 30.00    Types: Cigarettes    Quit date: 04/2010    Years since quitting: 11.4   Smokeless tobacco: Never  Vaping Use   Vaping Use: Never used  Substance and Sexual Activity   Alcohol use: Not Currently   Drug use: Never   Sexual activity: Not on file  Other Topics Concern   Not on file  Social History Narrative   Retired from [employer] in Engineer, materials.   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   1 grandson and 1 granddaughter   2 step grandsons   Social Determinants of Radio broadcast assistant Strain: Low Risk    Difficulty of Paying Living Expenses: Not hard at all  Food Insecurity: No Food Insecurity   Worried About Charity fundraiser in the Last Year: Never true   Arboriculturist in the Last Year: Never true  Transportation Needs: No Transportation Needs   Lack of Transportation (Medical): No    Lack of Transportation (Non-Medical): No  Physical Activity: Insufficiently Active   Days of Exercise per Week: 4 days   Minutes of Exercise per Session: 30 min  Stress: No Stress Concern Present   Feeling of Stress : Not at all  Social Connections: Socially Integrated   Frequency of Communication with Friends and Family: More than three times a week   Frequency of Social Gatherings with Friends and Family: More than three times a week   Attends Religious Services: More than 4 times per year   Active Member of [employer] or Organizations: Yes   Attends Music therapist: More than 4 times per year   Marital Status: Married     Family History: The patient's family history includes Cancer in his mother; Heart attack in his father.  ROS:   Please see the history of present illness.     All other systems reviewed and are negative.  EKGs/Labs/Other Studies Reviewed:    The following studies were reviewed today:  Recent Labs: 08/09/2021: Magnesium 1.9 08/11/2021: ALT 14 08/14/2021: BUN 13; Creatinine, Ser 1.03; Hemoglobin 10.0; Platelets 367; Potassium 3.8; Sodium 134  Recent Lipid Panel    Component Value Date/Time   CHOL 222 (H) 05/30/2021 0826   TRIG 140 05/30/2021 0826   HDL 44 05/30/2021 0826   CHOLHDL 5.0 05/30/2021 0826   CHOLHDL 3.7 12/23/2013 0809   VLDL 25 12/23/2013 0809   LDLCALC 153 (H) 05/30/2021 0826     Risk Assessment/Calculations:    CHA2DS2-VASc Score = 3   This indicates a 3.2% annual risk of stroke. The patient's score is based upon: CHF History: 0 HTN History: 1 Diabetes History: 0 Stroke History: 0 Vascular Disease History: 0 Age Score: 2 Gender Score: 0          Physical Exam:    VS:  BP 128/68   Pulse (!) 54   Ht '5\' 7"'$  (1.702 m)   Wt 165 lb (74.8 kg)   SpO2 99%   BMI 25.84 kg/m     Wt Readings from Last 3 Encounters:  09/14/21 165 lb (74.8 kg)  09/06/21 164 lb 3.2 oz (74.5 kg)  08/26/21 163 lb 9.3 oz (74.2 kg)    Gen: no  distress   Neck: No JVD Cardiac: No Rubs or Gallops, no Murmur, regular bradycardia, +2 radial pulses Respiratory: Clear to auscultation bilaterally, normal effort, normal  respiratory rate GI: Soft, nontender, non-distended  MS: No  edema;  moves all extremities Integument: Skin feels warm Neuro:  At time of evaluation, alert and oriented to person/place/time/situation  Psych: Normal affect, patient feels well   ASSESSMENT:    1. Paroxysmal atrial fibrillation (HCC)   2. Medication management   3. Essential hypertension, benign   4. Hematuria, unspecified type    PLAN:    Paroxsymal Atrial Fibrillation Hematuria S/p TURP  - CHADSVASC=3, HASBLED 3 - presently in sinus, we will continue atenolol 50 mg and stop his amiodarone (he has stopped since Monday) - will get TSH and LFTs - Will confirmed with Dr. Louis Meckel, would attempt ASA week of 10/03/21 (will check CBC first) - if he tolerates this well, will increase to eliquis 5 mg PO BID and check CBC - if he tolerates this well, no further therapy needed - if not, we will back off to less aggressive course; I suspect he would at least be able to tolerate short term AC; we have discussed the Watchman FLX device and would send him for eval if issues  [facility] Referral for Left Atrial Appendage Closure with Non-Valvular Atrial Fibrillation   Joshua Alvarez is a 76 y.o. male is being referred to the [facility] Team for evaluation for Left Atrial Appendage Closure with Watchman device for the management of stroke risk resulting form non-valvular atrial fibrillation.    Base upon Mr. Garbers history, he is felt to be a poor candidate for long-term anticoagulation because of a history of bleeding (e.g. intracerebral, subdural, GI, retro-peritoneal).  The patient has a HAS-BLED score of 3 indicating a Yearly Major Bleeding Risk of 3.74%.      His CHADS2-VASc Score is 3 with an unadjusted Ischemic Stroke  Rate (% per year) of 3.2%.    His stroke risk necessitates a strategy of stroke prevention with either long-term oral anticoagulation or left atrial appendage occlusion therapy. We have discussed their bleeding risk in the context of their comorbid medical problems, as well as the rationale for referral for evaluation of Watchman left atrial appendage occlusion therapy. While the patient is at high long-term bleeding risk, they may be appropriate for short-term anticoagulation. Based on this individual patient's stroke and bleeding risk, a shared decision has been made to refer the patient for consideration of Watchman left atrial appendage closure utilizing the [employer] of Cardiology shared decision tool.  Watchman consideration if bleeding issues return,   Fall f/u Dr. Domenic Polite or APP            Medication Adjustments/Labs and Tests Ordered: Current medicines are reviewed at length with the patient today.  Concerns regarding medicines are outlined above.  Orders Placed This Encounter  Procedures   TSH   Hepatic function panel   CBC   No orders of the defined types were placed in this encounter.   Patient Instructions  Medication Instructions:  STOP AMIODARONE  *If you need a refill on your cardiac medications before your next appointment, please call your pharmacy*   Lab Work: TSH, HEPATIC   CBC WEEK OF 10/03/21 AT [place]   If you have labs (blood work) drawn today and your tests are completely normal, you will receive your results only by: [place] (if you have MyChart) OR A paper copy in the mail If you have any lab test that is abnormal or we need to change your treatment, we will call you to review the results.  Testing/Procedures: NONE   Follow-Up: At [facility], you and your health needs are our priority.  As part of our continuing mission to provide you with exceptional heart care, we have created designated  Provider Care Teams.  These Care Teams include your primary Cardiologist (physician) and Advanced Practice Providers (APPs -  Physician Assistants and Nurse Practitioners) who all work together to provide you with the care you need, when you need it.  We recommend signing up for the patient portal called "MyChart".  Sign up information is provided on this After Visit Summary.  MyChart is used to connect with patients for Virtual Visits (Telemedicine).  Patients are able to view lab/test results, encounter notes, upcoming appointments, etc.  Non-urgent messages can be sent to your provider as well.   To learn more about what you can do with MyChart, go to [email or web address].    Your next appointment:   4 month(s)  The format for your next appointment:   In Person  Provider:   You may see Rozann Lesches, MD or one of the following Advanced Practice Providers on your designated Care Team:   Bernerd Pho, PA-C  Ermalinda Barrios, [place]      Other Instructions   Important Information About Sugar         Signed, Werner Lean, MD  09/14/2021 2:51 PM    [place]

## 2021-09-14 NOTE — Patient Instructions (Addendum)
Medication Instructions:  STOP AMIODARONE  *If you need a refill on your cardiac medications before your next appointment, please call your pharmacy*   Lab Work: TSH, HEPATIC   CBC WEEK OF 10/03/21 AT [place]   If you have labs (blood work) drawn today and your tests are completely normal, you will receive your results only by: [place] (if you have MyChart) OR A paper copy in the mail If you have any lab test that is abnormal or we need to change your treatment, we will call you to review the results.   Testing/Procedures: NONE   Follow-Up: At [facility], you and your health needs are our priority.  As part of our continuing mission to provide you with exceptional heart care, we have created designated Provider Care Teams.  These Care Teams include your primary Cardiologist (physician) and Advanced Practice Providers (APPs -  Physician Assistants and Nurse Practitioners) who all work together to provide you with the care you need, when you need it.  We recommend signing up for the patient portal called "MyChart".  Sign up information is provided on this After Visit Summary.  MyChart is used to connect with patients for Virtual Visits (Telemedicine).  Patients are able to view lab/test results, encounter notes, upcoming appointments, etc.  Non-urgent messages can be sent to your provider as well.   To learn more about what you can do with MyChart, go to [email or web address].    Your next appointment:   4 month(s)  The format for your next appointment:   In Person  Provider:   You may see Rozann Lesches, MD or one of the following Advanced Practice Providers on your designated Care Team:   Bernerd Pho, PA-C  Ermalinda Barrios, PA-C      Other Instructions   Important Information About Sugar

## 2021-10-03 ENCOUNTER — Encounter: Payer: Self-pay | Admitting: Family Medicine

## 2021-10-03 ENCOUNTER — Ambulatory Visit (INDEPENDENT_AMBULATORY_CARE_PROVIDER_SITE_OTHER): Payer: Medicare HMO | Admitting: Family Medicine

## 2021-10-03 DIAGNOSIS — I480 Paroxysmal atrial fibrillation: Secondary | ICD-10-CM | POA: Diagnosis not present

## 2021-10-03 DIAGNOSIS — H9201 Otalgia, right ear: Secondary | ICD-10-CM | POA: Insufficient documentation

## 2021-10-03 DIAGNOSIS — Z79899 Other long term (current) drug therapy: Secondary | ICD-10-CM | POA: Diagnosis not present

## 2021-10-03 LAB — CBC
Hematocrit: 38.6 % (ref 37.5–51.0)
Hemoglobin: 12.5 g/dL — ABNORMAL LOW (ref 13.0–17.7)
MCH: 26.7 pg (ref 26.6–33.0)
MCHC: 32.4 g/dL (ref 31.5–35.7)
MCV: 82 fL (ref 79–97)
Platelets: 250 10*3/uL (ref 150–450)
RBC: 4.69 x10E6/uL (ref 4.14–5.80)
RDW: 14.1 % (ref 11.6–15.4)
WBC: 6.5 10*3/uL (ref 3.4–10.8)

## 2021-10-05 ENCOUNTER — Telehealth: Payer: Self-pay | Admitting: Internal Medicine

## 2021-10-05 DIAGNOSIS — Z79899 Other long term (current) drug therapy: Secondary | ICD-10-CM

## 2021-10-05 DIAGNOSIS — R319 Hematuria, unspecified: Secondary | ICD-10-CM

## 2021-10-05 MED ORDER — APIXABAN 5 MG PO TABS: 5.0000 mg | ORAL_TABLET | Freq: Two times a day (BID) | ORAL | 3 refills | Status: DC

## 2021-10-05 NOTE — Telephone Encounter (Signed)
New Message:    Patient wants to know what medicine is he supposed to be taking?

## 2021-10-05 NOTE — Telephone Encounter (Signed)
Joshua Lean, MD  10/04/2021  4:55 PM EDT Back to Top    Results: Stop ASA and restart eliquis 5 mg CBC in two weeks     Joshua Lean, MD     I spoke with patient. He will re-start Eliquis 5 mg bid and repeat cbc in 2 weeks at [facility]

## 2021-10-20 DIAGNOSIS — R319 Hematuria, unspecified: Secondary | ICD-10-CM | POA: Diagnosis not present

## 2021-10-20 DIAGNOSIS — Z79899 Other long term (current) drug therapy: Secondary | ICD-10-CM | POA: Diagnosis not present

## 2021-10-21 LAB — CBC
Hematocrit: 38.2 % (ref 37.5–51.0)
Hemoglobin: 12.3 g/dL — ABNORMAL LOW (ref 13.0–17.7)
MCH: 26.8 pg (ref 26.6–33.0)
MCHC: 32.2 g/dL (ref 31.5–35.7)
MCV: 83 fL (ref 79–97)
Platelets: 272 10*3/uL (ref 150–450)
RBC: 4.59 x10E6/uL (ref 4.14–5.80)
RDW: 13.6 % (ref 11.6–15.4)
WBC: 6.7 10*3/uL (ref 3.4–10.8)

## 2021-11-14 ENCOUNTER — Ambulatory Visit: Payer: PRIVATE HEALTH INSURANCE | Admitting: Family Medicine

## 2021-11-28 ENCOUNTER — Ambulatory Visit: Payer: Medicare HMO | Admitting: Cardiology

## 2021-11-28 ENCOUNTER — Encounter: Payer: Self-pay | Admitting: Cardiology

## 2021-11-28 VITALS — BP 138/86 | HR 53 | Ht 67.0 in | Wt 166.6 lb

## 2021-11-28 DIAGNOSIS — I10 Essential (primary) hypertension: Secondary | ICD-10-CM | POA: Diagnosis not present

## 2021-11-28 DIAGNOSIS — I480 Paroxysmal atrial fibrillation: Secondary | ICD-10-CM

## 2021-11-28 MED ORDER — ATENOLOL 25 MG PO TABS: 25.0000 mg | ORAL_TABLET | Freq: Every day | ORAL | 2 refills | Status: DC

## 2021-11-28 NOTE — Patient Instructions (Addendum)
Medication Instructions:  Your physician has recommended you make the following change in your medication:  Decrease atenolol to 25 mg daily Continue other medications the same  Labwork: none  Testing/Procedures: none  Follow-Up: Your physician recommends that you schedule a follow-up appointment in: 6 months  Any Other Special Instructions Will Be Listed Below (If Applicable).  If you need a refill on your cardiac medications before your next appointment, please call your pharmacy.

## 2021-11-28 NOTE — Progress Notes (Signed)
Cardiology Office Note  Date: 11/28/2021   ID: Joshua Alvarez, DOB June 05, 1945, MRN [id number]  PCP:  Coral Spikes, DO  Cardiologist:  Rozann Lesches, MD Electrophysiologist:  None   Chief Complaint  Patient presents with   Cardiac follow-up    History of Present Illness: Joshua Alvarez is a 76 y.o. male last seen in the office by Dr. Glenford Bayley in June, I reviewed the note.  He is here for a routine visit.  Reports no significant bleeding problems, specifically no recurrent hematuria.  I did review interval chart since our last encounter.  His most recent hemoglobin is 12.3 and he is tolerating Eliquis at this point.  Remains in sinus bradycardia with heart rate in the low 50s on current dose of atenolol, no longer on amiodarone which was used temporarily.  We went over his medications today and discussed reducing atenolol dose, otherwise continuing Eliquis and Norvasc.  No clear indication to push for Watchman device implantation at this time.  Past Medical History:  Diagnosis Date   Allergic rhinitis    Anemia    Arthritis    Bladder spasms    Colon polyp    History of kidney stones    x 3    Hypertension    Migraine headache    Paroxysmal atrial fibrillation [facility])    Diagnosed April 2023   Skin cancer    Urgency of urination     Past Surgical History:  Procedure Laterality Date   COLONOSCOPY     COLONOSCOPY N/A 02/11/2014   Procedure: COLONOSCOPY;  Surgeon: Rogene Houston, MD;  [facility];  Service: Endoscopy;  Laterality: N/A;  730   CYSTOSCOPY     x 2 - stone removal   FINGER SURGERY Right    right 5th finger   INGUINAL HERNIA REPAIR Bilateral    INSERTION OF MESH Left 05/04/2020   Procedure: INSERTION OF MESH;  Surgeon: Ralene Ok, MD;  Location: [place];  Service: General;  Laterality: Left;   RIGHT KNEE REPAIRED     TRANSURETHRAL RESECTION OF PROSTATE N/A 08/26/2021   Procedure: TRANSURETHRAL RESECTION OF THE PROSTATE (TURP);   Surgeon: Ardis Hughs, MD;  [facility];  Service: Urology;  Laterality: N/A;   XI ROBOTIC ASSISTED INGUINAL HERNIA REPAIR WITH MESH Left 05/04/2020   Procedure: XI ROBOTIC ASSISTED LEFT INGUINAL HERNIA REPAIR WITH MESH;  Surgeon: Ralene Ok, MD;  Location: [place];  Service: General;  Laterality: Left;    Current Outpatient Medications  Medication Sig Dispense Refill   amLODipine (NORVASC) 5 MG tablet Take 0.5 tablets (2.5 mg total) by mouth daily. 45 tablet 3   apixaban (ELIQUIS) 5 MG TABS tablet Take 1 tablet (5 mg total) by mouth 2 (two) times daily. 60 tablet 3   atenolol (TENORMIN) 25 MG tablet Take 1 tablet (25 mg total) by mouth daily. 90 tablet 2   ezetimibe (ZETIA) 10 MG tablet Take 10 mg by mouth daily.     No current facility-administered medications for this visit.   Allergies:  Statins, Clarithromycin, and Enalapril   ROS: No orthopnea or PND.  Physical Exam: VS:  BP 138/86   Pulse (!) 53   Ht '5\' 7"'$  (1.702 m)   Wt 166 lb 9.6 oz (75.6 kg)   SpO2 99%   BMI 26.09 kg/m , BMI Body mass index is 26.09 kg/m.  Wt Readings from Last 3 Encounters:  11/28/21 166 lb 9.6 oz (75.6 kg)  10/03/21 168 lb 3.2 oz (76.3 kg)  09/14/21 165 lb (74.8 kg)    General: Patient appears comfortable at rest. HEENT: Conjunctiva and lids normal. Neck: Supple, no elevated JVP or carotid bruits, no thyromegaly. Lungs: Clear to auscultation, nonlabored breathing at rest. Cardiac: Regular rate and rhythm, no S3, 1/6 systolic murmur. Extremities: No pitting edema.  ECG:  An ECG dated 07/29/2021 was personally reviewed today and demonstrated:  Atrial fibrillation.  Recent Labwork: 08/09/2021: Magnesium 1.9 08/11/2021: ALT 14; AST 21 08/14/2021: BUN 13; Creatinine, Ser 1.03; Potassium 3.8; Sodium 134 10/20/2021: Hemoglobin 12.3; Platelets 272     Component Value Date/Time   CHOL 222 (H) 05/30/2021 0826   TRIG 140 05/30/2021 0826   HDL 44 05/30/2021 0826   CHOLHDL 5.0 05/30/2021 0826    CHOLHDL 3.7 12/23/2013 0809   VLDL 25 12/23/2013 0809   LDLCALC 153 (H) 05/30/2021 0826    Other Studies Reviewed Today:  Echocardiogram 07/29/2021:  1. Left ventricular ejection fraction, by estimation, is 60 to 65%. The  left ventricle has normal function. The left ventricle has no regional  wall motion abnormalities. Left ventricular diastolic parameters are  indeterminate.   2. Right ventricular systolic function is normal. The right ventricular  size is normal.   3. The mitral valve is normal in structure. Mild mitral valve  regurgitation.   4. The aortic valve is abnormal. Aortic valve regurgitation is mild.  Aortic valve sclerosis/calcification is present, without any evidence of  aortic stenosis.   Assessment and Plan:  1.  Paroxysmal atrial fibrillation with CHA2DS2-VASc score of 4.  He is tolerating Eliquis with no recurring hematuria and stable hemoglobin (reversible cause of bleeding - had traumatic Foley insertion and sepsis, subsequently TURP).  No clear indication for Watchman device referral at this time.  Would plan to maintain Eliquis, reduce atenolol to 25 mg daily given low heart rate in sinus rhythm and otherwise continue with observation.  2.  Essential hypertension, continue Norvasc.  Medication Adjustments/Labs and Tests Ordered: Current medicines are reviewed at length with the patient today.  Concerns regarding medicines are outlined above.   Tests Ordered: No orders of the defined types were placed in this encounter.   Medication Changes: Meds ordered this encounter  Medications   atenolol (TENORMIN) 25 MG tablet    Sig: Take 1 tablet (25 mg total) by mouth daily.    Dispense:  90 tablet    Refill:  2    11/28/2021 dose decrease    Disposition:  Follow up  6 months.  Signed, Satira Sark, MD, [facility] 11/28/2021 4:25 PM    [place] [facility] [place], [place], [place] [address] Phone: [phone]; Fax: [phone]

## 2021-12-13 DIAGNOSIS — R972 Elevated prostate specific antigen [PSA]: Secondary | ICD-10-CM | POA: Diagnosis not present

## 2021-12-13 DIAGNOSIS — N401 Enlarged prostate with lower urinary tract symptoms: Secondary | ICD-10-CM | POA: Diagnosis not present

## 2021-12-13 DIAGNOSIS — R338 Other retention of urine: Secondary | ICD-10-CM | POA: Diagnosis not present

## 2021-12-19 ENCOUNTER — Ambulatory Visit (INDEPENDENT_AMBULATORY_CARE_PROVIDER_SITE_OTHER): Payer: Medicare HMO | Admitting: Family Medicine

## 2021-12-19 VITALS — BP 136/94 | HR 60 | Temp 97.3°F | Ht 67.0 in | Wt 171.0 lb

## 2021-12-19 DIAGNOSIS — I480 Paroxysmal atrial fibrillation: Secondary | ICD-10-CM

## 2021-12-19 DIAGNOSIS — I10 Essential (primary) hypertension: Secondary | ICD-10-CM | POA: Diagnosis not present

## 2021-12-19 DIAGNOSIS — E782 Mixed hyperlipidemia: Secondary | ICD-10-CM | POA: Diagnosis not present

## 2021-12-19 DIAGNOSIS — D649 Anemia, unspecified: Secondary | ICD-10-CM

## 2021-12-19 NOTE — Assessment & Plan Note (Signed)
Diastolic blood pressure mildly elevated today.  We will continue to monitor.  Continue atenolol and amlodipine.

## 2021-12-19 NOTE — Patient Instructions (Signed)
Labs today.  Continue your medications.  Follow up in 6 months.  Take care  Dr. Kadesha Virrueta  

## 2021-12-19 NOTE — Progress Notes (Signed)
Subjective:  Patient ID: Joshua Alvarez, male    DOB: 1946/04/05  Age: 76 y.o. MRN: [id number]  CC: Chief Complaint  Patient presents with   Follow-up    On chronic medical conditions, labs requested    HPI:  76 year old male with hypertension, atrial fibrillation, BPH status post TURP, hyperlipidemia presents for follow-up.  Patient states that he is doing well.  He has no complaints or concerns at this time.  Diastolic blood pressure slightly elevated today and on repeat.  He follows with cardiology.  He is currently on amlodipine and atenolol.  His blood pressures have been well controlled previously.  He remains on Eliquis regarding atrial fibrillation.  Patient is intolerant of statins.  He is currently on Zetia.  Needs lipid panel.  Has had a recent history of blood loss anemia.  Needs labs today.   Patient Active Problem List   Diagnosis Date Noted   Anemia 12/19/2021   Atrial fibrillation ([place]) 08/17/2021   Prediabetes 07/18/2021   Basal cell carcinoma of skin of scalp and neck 03/22/2021   Statin intolerance 08/12/2020   Hyperlipidemia 11/19/2012   Essential hypertension, benign 11/19/2012   BPH (benign prostatic hyperplasia) 11/19/2012    Social Hx   Social History   Socioeconomic History   Marital status: Married    Spouse name: Ann   Number of children: 2   Years of education: Not on file   Highest education level: Not on file  Occupational History   Not on file  Tobacco Use   Smoking status: Former    Years: 30.00    Types: Cigarettes    Quit date: 04/2010    Years since quitting: 11.7   Smokeless tobacco: Never  Vaping Use   Vaping Use: Never used  Substance and Sexual Activity   Alcohol use: Not Currently   Drug use: Never   Sexual activity: Not on file  Other Topics Concern   Not on file  Social History Narrative   Retired from [employer] in Engineer, materials.   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   1 grandson and 1 granddaughter   2 step grandsons    Social Determinants of Health   Financial Resource Strain: Low Risk  (03/22/2021)   Overall Financial Resource Strain (CARDIA)    Difficulty of Paying Living Expenses: Not hard at all  Food Insecurity: No Food Insecurity (03/22/2021)   Hunger Vital Sign    Worried About Running Out of Food in the Last Year: Never true    Ran Out of Food in the Last Year: Never true  Transportation Needs: No Transportation Needs (03/22/2021)   PRAPARE - Hydrologist (Medical): No    Lack of Transportation (Non-Medical): No  Physical Activity: Insufficiently Active (03/22/2021)   Exercise Vital Sign    Days of Exercise per Week: 4 days    Minutes of Exercise per Session: 30 min  Stress: No Stress Concern Present (03/22/2021)   [place]    Feeling of Stress : Not at all  Social Connections: [place] (03/22/2021)   Social Connection and Isolation Panel [NHANES]    Frequency of Communication with Friends and Family: More than three times a week    Frequency of Social Gatherings with Friends and Family: More than three times a week    Attends Religious Services: More than 4 times per year    Active Member of [employer] or Organizations: Yes  Attends Archivist Meetings: More than 4 times per year    Marital Status: Married    Review of Systems  Respiratory: Negative.    Cardiovascular: Negative.    Objective:  BP (!) 136/94   Pulse 60   Temp (!) 97.3 F (36.3 C)   Ht _0  (1.702 m)   Wt 171 lb (77.6 kg)   SpO2 99%   BMI 26.78 kg/m      12/19/2021   10:11 AM 12/19/2021    9:54 AM 11/28/2021    4:03 PM  BP/Weight  Systolic BP 159 470 761  Diastolic BP 94 92 86  Wt. (Lbs)  171 166.6  BMI  26.78 kg/m2 26.09 kg/m2    Physical Exam Vitals and nursing note reviewed.  Constitutional:      Appearance: Normal appearance.  HENT:     Head: Normocephalic and atraumatic.   Eyes:     General:        Right eye: No discharge.        Left eye: No discharge.     Conjunctiva/sclera: Conjunctivae normal.  Cardiovascular:     Rate and Rhythm: Normal rate and regular rhythm.  Pulmonary:     Effort: Pulmonary effort is normal.     Breath sounds: Normal breath sounds. No wheezing or rales.  Neurological:     Mental Status: He is alert.  Psychiatric:        Mood and Affect: Mood normal.        Behavior: Behavior normal.    Lab Results  Component Value Date   WBC 6.7 10/20/2021   HGB 12.3 (L) 10/20/2021   HCT 38.2 10/20/2021   PLT 272 10/20/2021   GLUCOSE 102 (H) 08/14/2021   CHOL 222 (H) 05/30/2021   TRIG 140 05/30/2021   HDL 44 05/30/2021   LDLCALC 153 (H) 05/30/2021   ALT 14 08/11/2021   AST 21 08/11/2021   NA 134 (L) 08/14/2021   K 3.8 08/14/2021   CL 97 (L) 08/14/2021   CREATININE 1.03 08/14/2021   BUN 13 08/14/2021   CO2 29 08/14/2021   PSA 3.9 07/27/2014   HGBA1C 5.9 (H) 05/30/2021     Assessment & Plan:   Problem List Items Addressed This Visit       Cardiovascular and Mediastinum   Atrial fibrillation ([place])    Stable.  Continue Eliquis and atenolol.      Essential hypertension, benign - Primary    Diastolic blood pressure mildly elevated today.  We will continue to monitor.  Continue atenolol and amlodipine.      Relevant Orders   CMP14+EGFR     Other   Anemia    Labs today.      Relevant Orders   CBC   Iron, TIBC and Ferritin Panel   Vitamin B12   Hyperlipidemia    Lipid panel today to assess.  Continue Zetia.       No orders of the defined types were placed in this encounter.   Follow-up: 6 months  [place]

## 2021-12-19 NOTE — Assessment & Plan Note (Signed)
Stable.  Continue Eliquis and atenolol.

## 2021-12-19 NOTE — Assessment & Plan Note (Signed)
Lipid panel today to assess.  Continue Zetia.

## 2021-12-19 NOTE — Assessment & Plan Note (Signed)
Labs today

## 2021-12-20 LAB — CBC
Hematocrit: 44.3 % (ref 37.5–51.0)
Hemoglobin: 14.4 g/dL (ref 13.0–17.7)
MCH: 26.5 pg — ABNORMAL LOW (ref 26.6–33.0)
MCHC: 32.5 g/dL (ref 31.5–35.7)
MCV: 82 fL (ref 79–97)
Platelets: 231 10*3/uL (ref 150–450)
RBC: 5.43 x10E6/uL (ref 4.14–5.80)
RDW: 14.2 % (ref 11.6–15.4)
WBC: 7.2 10*3/uL (ref 3.4–10.8)

## 2021-12-20 LAB — IRON,TIBC AND FERRITIN PANEL
Ferritin: 45 ng/mL (ref 30–400)
Iron Saturation: 23 % (ref 15–55)
Iron: 78 ug/dL (ref 38–169)
Total Iron Binding Capacity: 337 ug/dL (ref 250–450)
UIBC: 259 ug/dL (ref 111–343)

## 2021-12-20 LAB — CMP14+EGFR
ALT: 18 IU/L (ref 0–44)
AST: 23 IU/L (ref 0–40)
Albumin/Globulin Ratio: 1.7 (ref 1.2–2.2)
Albumin: 4.5 g/dL (ref 3.8–4.8)
Alkaline Phosphatase: 123 IU/L — ABNORMAL HIGH (ref 44–121)
BUN/Creatinine Ratio: 9 — ABNORMAL LOW (ref 10–24)
BUN: 9 mg/dL (ref 8–27)
Bilirubin Total: 0.4 mg/dL (ref 0.0–1.2)
CO2: 26 mmol/L (ref 20–29)
Calcium: 9.8 mg/dL (ref 8.6–10.2)
Chloride: 99 mmol/L (ref 96–106)
Creatinine, Ser: 1 mg/dL (ref 0.76–1.27)
Globulin, Total: 2.7 g/dL (ref 1.5–4.5)
Glucose: 94 mg/dL (ref 70–99)
Potassium: 5 mmol/L (ref 3.5–5.2)
Sodium: 139 mmol/L (ref 134–144)
Total Protein: 7.2 g/dL (ref 6.0–8.5)
eGFR: 78 mL/min/{1.73_m2} (ref >59)

## 2021-12-20 LAB — VITAMIN B12: Vitamin B-12: 331 pg/mL (ref 232–1245)

## 2022-01-02 DIAGNOSIS — D3132 Benign neoplasm of left choroid: Secondary | ICD-10-CM | POA: Diagnosis not present

## 2022-01-04 ENCOUNTER — Ambulatory Visit (HOSPITAL_COMMUNITY)
Admission: RE | Admit: 2022-01-04 | Discharge: 2022-01-04 | Disposition: A | Discharge status: Home / Self Care | Payer: Medicare HMO | Source: Ambulatory Visit | Attending: Family Medicine | Admitting: Family Medicine

## 2022-01-04 ENCOUNTER — Ambulatory Visit (INDEPENDENT_AMBULATORY_CARE_PROVIDER_SITE_OTHER): Payer: Medicare HMO | Admitting: Family Medicine

## 2022-01-04 VITALS — BP 137/76 | Temp 98.2°F | Wt 176.0 lb

## 2022-01-04 DIAGNOSIS — J988 Other specified respiratory disorders: Secondary | ICD-10-CM | POA: Diagnosis not present

## 2022-01-04 DIAGNOSIS — R051 Acute cough: Secondary | ICD-10-CM

## 2022-01-04 DIAGNOSIS — R059 Cough, unspecified: Secondary | ICD-10-CM | POA: Diagnosis not present

## 2022-01-04 MED ORDER — AMOXICILLIN-POT CLAVULANATE 875-125 MG PO TABS: 1.0000 | ORAL_TABLET | Freq: Two times a day (BID) | ORAL | 0 refills | Status: DC

## 2022-01-04 NOTE — Patient Instructions (Signed)
Chest xray today.  We will call with results.  Take care  Dr. Diani Jillson  

## 2022-01-04 NOTE — Progress Notes (Signed)
Subjective:  Patient ID: Joshua Alvarez, male    DOB: 11/13/1945  Age: 76 y.o. MRN: [id number]  CC: Chief Complaint  Patient presents with   Cough    Sinus drainage since Friday    HPI:  76 year old male with hypertension, A-fib, hyperlipidemia, BPH status post prostatectomy presents with the above complaint.  Patient reports respiratory symptoms since Friday.  Has had a recent sick contact in his granddaughter.  Reports cough and congestion.  Localizes the sinus pressure and congestion to the frontal region and maxillary region.  No fever.  No relieving factors.  No other complaints.  Patient Active Problem List   Diagnosis Date Noted   Respiratory infection 01/04/2022   Atrial fibrillation ([place]) 08/17/2021   Prediabetes 07/18/2021   Basal cell carcinoma of skin of scalp and neck 03/22/2021   Statin intolerance 08/12/2020   Hyperlipidemia 11/19/2012   Essential hypertension, benign 11/19/2012   BPH (benign prostatic hyperplasia) 11/19/2012    Social Hx   Social History   Socioeconomic History   Marital status: Married    Spouse name: Ann   Number of children: 2   Years of education: Not on file   Highest education level: Not on file  Occupational History   Not on file  Tobacco Use   Smoking status: Former    Years: 30.00    Types: Cigarettes    Quit date: 04/2010    Years since quitting: 11.7   Smokeless tobacco: Never  Vaping Use   Vaping Use: Never used  Substance and Sexual Activity   Alcohol use: Not Currently   Drug use: Never   Sexual activity: Not on file  Other Topics Concern   Not on file  Social History Narrative   Retired from [employer] in Engineer, materials.   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   1 grandson and 1 granddaughter   2 step grandsons   Social Determinants of Health   Financial Resource Strain: Low Risk  (03/22/2021)   Overall Financial Resource Strain (CARDIA)    Difficulty of Paying Living Expenses: Not hard at all  Food Insecurity: No  Food Insecurity (03/22/2021)   Hunger Vital Sign    Worried About Running Out of Food in the Last Year: Never true    Ran Out of Food in the Last Year: Never true  Transportation Needs: No Transportation Needs (03/22/2021)   PRAPARE - Hydrologist (Medical): No    Lack of Transportation (Non-Medical): No  Physical Activity: Insufficiently Active (03/22/2021)   Exercise Vital Sign    Days of Exercise per Week: 4 days    Minutes of Exercise per Session: 30 min  Stress: No Stress Concern Present (03/22/2021)   [place]    Feeling of Stress : Not at all  Social Connections: [place] (03/22/2021)   Social Connection and Isolation Panel [NHANES]    Frequency of Communication with Friends and Family: More than three times a week    Frequency of Social Gatherings with Friends and Family: More than three times a week    Attends Religious Services: More than 4 times per year    Active Member of [employer] or Organizations: Yes    Attends Music therapist: More than 4 times per year    Marital Status: Married    Review of Systems Per HPI  Objective:  BP 137/76   Temp 98.2 F (36.8 C) (Oral)  Wt 176 lb (79.8 kg)   BMI 27.57 kg/m      01/04/2022   11:24 AM 12/19/2021   10:11 AM 12/19/2021    9:54 AM  BP/Weight  Systolic BP 197 588 325  Diastolic BP 76 94 92  Wt. (Lbs) 176  171  BMI 27.57 kg/m2  26.78 kg/m2    Physical Exam Vitals and nursing note reviewed.  Constitutional:      General: He is not in acute distress.    Appearance: Normal appearance.  HENT:     Head: Normocephalic and atraumatic.     Nose: Congestion present.  Eyes:     General:        Right eye: No discharge.        Left eye: No discharge.     Conjunctiva/sclera: Conjunctivae normal.  Cardiovascular:     Rate and Rhythm: Normal rate and regular rhythm.  Pulmonary:     Effort: Pulmonary  effort is normal.     Comments: Left basilar crackles. Neurological:     Mental Status: He is alert.  Psychiatric:        Mood and Affect: Mood normal.        Behavior: Behavior normal.     Lab Results  Component Value Date   WBC 7.2 12/19/2021   HGB 14.4 12/19/2021   HCT 44.3 12/19/2021   PLT 231 12/19/2021   GLUCOSE 94 12/19/2021   CHOL 222 (H) 05/30/2021   TRIG 140 05/30/2021   HDL 44 05/30/2021   LDLCALC 153 (H) 05/30/2021   ALT 18 12/19/2021   AST 23 12/19/2021   NA 139 12/19/2021   K 5.0 12/19/2021   CL 99 12/19/2021   CREATININE 1.00 12/19/2021   BUN 9 12/19/2021   CO2 26 12/19/2021   PSA 3.9 07/27/2014   HGBA1C 5.9 (H) 05/30/2021     Assessment & Plan:   Problem List Items Addressed This Visit       Respiratory   Respiratory infection - Primary    Chest x-ray was obtained today and was independently reviewed by me.  Interpretation: [place] density at the left base.  This was noted on the lateral view.  This is likely a nipple shadow.  Lungs appear clear.   Given age and physical exam findings, I am placing him on Augmentin.      Other Visit Diagnoses     Acute cough       Relevant Orders   DG Chest 2 View (Completed)       Meds ordered this encounter  Medications   amoxicillin-clavulanate (AUGMENTIN) 875-125 MG tablet    Sig: Take 1 tablet by mouth 2 (two) times daily.    Dispense:  20 tablet    Refill:  0    Follow-up: As previously scheduled  [place]

## 2022-01-04 NOTE — Assessment & Plan Note (Addendum)
Chest x-ray was obtained today and was independently reviewed by me.  Interpretation: [place] density at the left base.  This was noted on the lateral view.  This is likely a nipple shadow.  Lungs appear clear.   Given age and physical exam findings, I am placing him on Augmentin.

## 2022-01-06 ENCOUNTER — Other Ambulatory Visit: Payer: Self-pay | Admitting: Internal Medicine

## 2022-01-06 NOTE — Telephone Encounter (Signed)
Prescription refill request for Eliquis received. Indication:Afib Last office visit:8/23 Scr:1.0 Age: 76 Weight:79.8 kg  Prescription refilled

## 2022-01-17 ENCOUNTER — Ambulatory Visit: Payer: Medicare HMO | Admitting: Family Medicine

## 2022-02-08 DIAGNOSIS — Z0100 Encounter for examination of eyes and vision without abnormal findings: Secondary | ICD-10-CM | POA: Diagnosis not present

## 2022-02-14 NOTE — Progress Notes (Unsigned)
Cardiology Office Note    Date:  02/15/2022   ID:  Joshua Alvarez, DOB August 01, 1945, MRN [id number]  PCP:  Coral Spikes, DO  Cardiologist: Rozann Lesches, MD    Chief Complaint  Patient presents with   Follow-up    3 month visit    History of Present Illness:    Joshua Alvarez is a 75 y.o. male with past medical history of paroxysmal atrial fibrillation, HTN, hematuria and bladder spasms who presents to the office today for 5-monthfollow-up.  He was last evaluated by Dr. MDomenic Politein 11/2021 and denied any recurrent hematuria. He was maintaining normal sinus rhythm and given his heart rate in the 50's, Atenolol was reduced from 50 mg daily to 25 mg daily. Given that his prior hematuria had occurred in the setting of traumatic foley insertion and sepsis, he was continued on Eliquis since he was tolerating this well and was not referred for [facility] at that time.  In talking with the patient today, he reports overall feeling well since his last office visit. He denies any recent chest pain or palpitations. No specific dyspnea on exertion, orthopnea, PND or pitting edema. Has been experiencing allergies/sinus congestion and is taking Allegra. He stays active at baseline in doing routine household chores and also doing yard work[place] He reports good compliance with Eliquis and denies any recurrent hematuria No melena or hematochezia. He does report having more erectile dysfunction issues since being on Atenolol.   Past Medical History:  Diagnosis Date   Allergic rhinitis    Anemia    Arthritis    Bladder spasms    Colon polyp    History of kidney stones    x 3    Hypertension    Migraine headache    Paroxysmal atrial fibrillation ([facility]    Diagnosed April 2023   Skin cancer    Urgency of urination     Past Surgical History:  Procedure Laterality Date   COLONOSCOPY     COLONOSCOPY N/A 02/11/2014   Procedure:  COLONOSCOPY;  Surgeon: NRogene Houston MD;  [facility];  Service: Endoscopy;  Laterality: N/A;  730   CYSTOSCOPY     x 2 - stone removal   FINGER SURGERY Right    right 5th finger   INGUINAL HERNIA REPAIR Bilateral    INSERTION OF MESH Left 05/04/2020   Procedure: INSERTION OF MESH;  Surgeon: RRalene Ok MD;  [facility]  Service: General;  Laterality: Left;   RIGHT KNEE REPAIRED     TRANSURETHRAL RESECTION OF PROSTATE N/A 08/26/2021   Procedure: TRANSURETHRAL RESECTION OF THE PROSTATE (TURP);  Surgeon: HArdis Hughs MD;  Location: [facility];  Service: Urology;  Laterality: N/A;   XI ROBOTIC ASSISTED INGUINAL HERNIA REPAIR WITH MESH Left 05/04/2020   Procedure: XI ROBOTIC ASSISTED LEFT INGUINAL HERNIA REPAIR WITH MESH;  Surgeon: RRalene Ok MD;  [place] [facility]  Service: General;  Laterality: Left;    Current Medications: Outpatient Medications Prior to Visit  Medication Sig Dispense Refill   amLODipine (NORVASC) 5 MG tablet Take 0.5 tablets (2.5 mg total) by mouth daily. 45 tablet 3   apixaban (ELIQUIS) 5 MG TABS tablet TAKE ONE TABLET BY MOUTH TWICE A DAY 60 tablet 5   atenolol (TENORMIN) 25 MG tablet Take 1 tablet (25 mg total) by mouth daily. 90 tablet 2   ezetimibe (ZETIA) 10 MG tablet Take  10 mg by mouth daily.     amoxicillin-clavulanate (AUGMENTIN) 875-125 MG tablet Take 1 tablet by mouth 2 (two) times daily. 20 tablet 0   No facility-administered medications prior to visit.     Allergies:   Statins, Clarithromycin, and Enalapril   Social History   Socioeconomic History   Marital status: Married    Spouse name: Ann   Number of children: 2   Years of education: Not on file   Highest education level: Not on file  Occupational History   Not on file  Tobacco Use   Smoking status: Former    Years: 30.00    Types: Cigarettes    Quit date: 04/2010    Years since quitting: 11.8   Smokeless tobacco: Never  Vaping Use   Vaping Use: Never  used  Substance and Sexual Activity   Alcohol use: Not Currently   Drug use: Never   Sexual activity: Not on file  Other Topics Concern   Not on file  Social History Narrative   Retired from [employer] in Engineer, materials.   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   1 grandson and 1 granddaughter   2 step grandsons   Social Determinants of Health   Financial Resource Strain: Low Risk  (03/22/2021)   Overall Financial Resource Strain (CARDIA)    Difficulty of Paying Living Expenses: Not hard at all  Food Insecurity: No Food Insecurity (03/22/2021)   Hunger Vital Sign    Worried About Running Out of Food in the Last Year: Never true    Ran Out of Food in the Last Year: Never true  Transportation Needs: No Transportation Needs (03/22/2021)   PRAPARE - Hydrologist (Medical): No    Lack of Transportation (Non-Medical): No  Physical Activity: Insufficiently Active (03/22/2021)   Exercise Vital Sign    Days of Exercise per Week: 4 days    Minutes of Exercise per Session: 30 min  Stress: No Stress Concern Present (03/22/2021)   [place]    Feeling of Stress : Not at all  Social Connections: [place] (03/22/2021)   Social Connection and Isolation Panel [NHANES]    Frequency of Communication with Friends and Family: More than three times a week    Frequency of Social Gatherings with Friends and Family: More than three times a week    Attends Religious Services: More than 4 times per year    Active Member of [employer] or Organizations: Yes    Attends Music therapist: More than 4 times per year    Marital Status: Married     Family History:  The patient's family history includes Cancer in his mother; Heart attack in his father.   Review of Systems:    Please see the history of present illness.     All other systems reviewed and are otherwise negative except as noted  above.   Physical Exam:    VS:  BP 132/78   Pulse 62   Ht '5\' 7"'$  (1.702 m)   Wt 170 lb (77.1 kg)   SpO2 98%   BMI 26.63 kg/m    General: Pleasant male appearing in no acute distress. Head: Normocephalic, atraumatic. Neck: No carotid bruits. JVD not elevated.  Lungs: Respirations regular and unlabored, without wheezes or rales.  Heart: Regular rate and rhythm. No S3 or S4.  No murmur, no rubs, or gallops appreciated. Abdomen: Appears non-distended. No obvious abdominal masses.  Msk:  Strength and tone appear normal for age. No obvious joint deformities or effusions. Extremities: No clubbing or cyanosis. No pitting edema.  Distal pedal pulses are 2+ bilaterally. Neuro: Alert and oriented X 3. Moves all extremities spontaneously. No focal deficits noted. Psych:  Responds to questions appropriately with a normal affect. Skin: No rashes or lesions noted  Wt Readings from Last 3 Encounters:  02/15/22 170 lb (77.1 kg)  01/04/22 176 lb (79.8 kg)  12/19/21 171 lb (77.6 kg)     Studies/Labs Reviewed:   EKG:  EKG is not ordered today.   Recent Labs: 08/09/2021: Magnesium 1.9 12/19/2021: ALT 18; BUN 9; Creatinine, Ser 1.00; Hemoglobin 14.4; Platelets 231; Potassium 5.0; Sodium 139   Lipid Panel    Component Value Date/Time   CHOL 222 (H) 05/30/2021 0826   TRIG 140 05/30/2021 0826   HDL 44 05/30/2021 0826   CHOLHDL 5.0 05/30/2021 0826   CHOLHDL 3.7 12/23/2013 0809   VLDL 25 12/23/2013 0809   LDLCALC 153 (H) 05/30/2021 0826    Additional studies/ records that were reviewed today include:   Echocardiogram: 07/2021 IMPRESSIONS     1. Left ventricular ejection fraction, by estimation, is 60 to 65%. The  left ventricle has normal function. The left ventricle has no regional  wall motion abnormalities. Left ventricular diastolic parameters are  indeterminate.   2. Right ventricular systolic function is normal. The right ventricular  size is normal.   3. The mitral valve is  normal in structure. Mild mitral valve  regurgitation.   4. The aortic valve is abnormal. Aortic valve regurgitation is mild.  Aortic valve sclerosis/calcification is present, without any evidence of  aortic stenosis.   Assessment:    1. Paroxysmal atrial fibrillation (HCC)   2. Essential hypertension   3. Hyperlipidemia, unspecified hyperlipidemia type   4. Hematuria, unspecified type   5. Erectile dysfunction, unspecified erectile dysfunction type      Plan:   In order of problems listed above:  1. Paroxysmal Atrial Fibrillation - He denies any recent palpitations and is in normal sinus rhythm by examination today. Heart rate has improved following dose adjustment of Atenolol as he reports his heart rate has been in the 60's to 70's when checked at home. Continue Atenolol 25 mg daily for rate-control. - No reports of active bleeding. Remains on Eliquis 5 mg twice daily for anticoagulation and recent labs showed stable hemoglobin and platelets.  2. HTN - His blood pressure is well-controlled at 132/78 during today's visit. Continue current medical therapy with Amlodipine 2.5 mg daily and Atenolol 25 mg daily.  3. HLD - Followed by his PCP. He was previously intolerant to multiple statins in the past due to myalgias and has remained on Zetia 10 mg daily.  4. Hematuria - Previously followed by Urology but symptoms have now resolved. Hgb was stable at 14.4 by recent labs in 12/2021.  5. Erectile Dysfunction - He reports worsening symptoms following medication changes earlier this year and we reviewed that this was likely secondary to the use of Atenolol. Discussed switching to a different medication but he wishes to continue this for now given his well-controlled symptoms. Will provide with an Rx for Sildenafil 50 mg to take as needed. He does not have an Rx for SL NTG but we reviewed to not take this within 48 hours of use.    Medication Adjustments/Labs and Tests  Ordered: Current medicines are reviewed at length with the patient today.  Concerns regarding  medicines are outlined above.  Medication changes, Labs and Tests ordered today are listed in the Patient Instructions below. Patient Instructions  Medication Instructions:  Your physician recommends that you continue on your current medications as directed. Please refer to the Current Medication list given to you today.  *If you need a refill on your cardiac medications before your next appointment, please call your pharmacy*   Lab Work: NONE   If you have labs (blood work) drawn today and your tests are completely normal, you will receive your results only by: [place] (if you have MyChart) OR A paper copy in the mail If you have any lab test that is abnormal or we need to change your treatment, we will call you to review the results.   Testing/Procedures: NONE    Follow-Up: At [facility], you and your health needs are our priority.  As part of our continuing mission to provide you with exceptional heart care, we have created designated Provider Care Teams.  These Care Teams include your primary Cardiologist (physician) and Advanced Practice Providers (APPs -  Physician Assistants and Nurse Practitioners) who all work together to provide you with the care you need, when you need it.  We recommend signing up for the patient portal called "MyChart".  Sign up information is provided on this After Visit Summary.  MyChart is used to connect with patients for Virtual Visits (Telemedicine).  Patients are able to view lab/test results, encounter notes, upcoming appointments, etc.  Non-urgent messages can be sent to your provider as well.   To learn more about what you can do with MyChart, go to [email or web address].    Your next appointment:   6 month(s)  The format for your next appointment:   In Person  Provider:   You may see Rozann Lesches, MD or one of the following  Advanced Practice Providers on your designated Care Team:   Bernerd Pho, PA-C  Ermalinda Barrios, PA-C     Other Instructions Thank you for choosing [facility]!    Important Information About Sugar          Signed, Erma Heritage, PA-C  02/15/2022 1:27 PM    [facility] [address] [place], [place] [address] Phone: [phone] Fax: [phone]

## 2022-02-15 ENCOUNTER — Ambulatory Visit: Payer: Medicare HMO | Attending: Student | Admitting: Student

## 2022-02-15 ENCOUNTER — Encounter: Payer: Self-pay | Admitting: Student

## 2022-02-15 VITALS — BP 132/78 | HR 62 | Ht 67.0 in | Wt 170.0 lb

## 2022-02-15 DIAGNOSIS — E785 Hyperlipidemia, unspecified: Secondary | ICD-10-CM

## 2022-02-15 DIAGNOSIS — R319 Hematuria, unspecified: Secondary | ICD-10-CM

## 2022-02-15 DIAGNOSIS — N529 Male erectile dysfunction, unspecified: Secondary | ICD-10-CM | POA: Diagnosis not present

## 2022-02-15 DIAGNOSIS — I480 Paroxysmal atrial fibrillation: Secondary | ICD-10-CM | POA: Diagnosis not present

## 2022-02-15 DIAGNOSIS — I10 Essential (primary) hypertension: Secondary | ICD-10-CM | POA: Diagnosis not present

## 2022-02-15 MED ORDER — SILDENAFIL CITRATE 50 MG PO TABS: 50.0000 mg | ORAL_TABLET | Freq: Every day | ORAL | 0 refills | Status: DC | PRN

## 2022-02-15 NOTE — Patient Instructions (Signed)
Medication Instructions:  Your physician recommends that you continue on your current medications as directed. Please refer to the Current Medication list given to you today.  *If you need a refill on your cardiac medications before your next appointment, please call your pharmacy*   Lab Work: NONE   If you have labs (blood work) drawn today and your tests are completely normal, you will receive your results only by: [place] (if you have MyChart) OR A paper copy in the mail If you have any lab test that is abnormal or we need to change your treatment, we will call you to review the results.   Testing/Procedures: NONE    Follow-Up: At [facility], you and your health needs are our priority.  As part of our continuing mission to provide you with exceptional heart care, we have created designated Provider Care Teams.  These Care Teams include your primary Cardiologist (physician) and Advanced Practice Providers (APPs -  Physician Assistants and Nurse Practitioners) who all work together to provide you with the care you need, when you need it.  We recommend signing up for the patient portal called "MyChart".  Sign up information is provided on this After Visit Summary.  MyChart is used to connect with patients for Virtual Visits (Telemedicine).  Patients are able to view lab/test results, encounter notes, upcoming appointments, etc.  Non-urgent messages can be sent to your provider as well.   To learn more about what you can do with MyChart, go to [email or web address].    Your next appointment:   6 month(s)  The format for your next appointment:   In Person  Provider:   You may see Rozann Lesches, MD or one of the following Advanced Practice Providers on your designated Care Team:   Bernerd Pho, PA-C  Ermalinda Barrios, PA-C     Other Instructions Thank you for choosing [facility]!    Important Information About [place]

## 2022-02-17 ENCOUNTER — Ambulatory Visit (INDEPENDENT_AMBULATORY_CARE_PROVIDER_SITE_OTHER): Payer: Medicare HMO | Admitting: Family Medicine

## 2022-02-17 ENCOUNTER — Encounter: Payer: Self-pay | Admitting: Family Medicine

## 2022-02-17 VITALS — BP 154/80 | HR 68 | Temp 97.6°F | Ht 67.0 in | Wt 170.0 lb

## 2022-02-17 DIAGNOSIS — J209 Acute bronchitis, unspecified: Secondary | ICD-10-CM | POA: Diagnosis not present

## 2022-02-17 MED ORDER — IPRATROPIUM BROMIDE 0.06 % NA SOLN: 2.0000 | Freq: Four times a day (QID) | NASAL | 0 refills | Status: DC | PRN

## 2022-02-17 MED ORDER — DOXYCYCLINE HYCLATE 100 MG PO TABS: 100.0000 mg | ORAL_TABLET | Freq: Two times a day (BID) | ORAL | 0 refills | Status: DC

## 2022-02-17 MED ORDER — PROMETHAZINE-DM 6.25-15 MG/5ML PO SYRP: 5.0000 mL | ORAL_SOLUTION | Freq: Four times a day (QID) | ORAL | 0 refills | Status: DC | PRN

## 2022-02-17 NOTE — Patient Instructions (Signed)
Medications as prescribed. ° °Take care ° °Dr. Leen Tworek  °

## 2022-02-18 DIAGNOSIS — J209 Acute bronchitis, unspecified: Secondary | ICD-10-CM | POA: Insufficient documentation

## 2022-02-18 NOTE — Progress Notes (Signed)
Subjective:  Patient ID: Joshua Alvarez, male    DOB: 1945/10/04  Age: 76 y.o. MRN: [id number]  CC: Chief Complaint  Patient presents with   sinus drainage   Cough    Chest congestion- 10 days or more, no fever    HPI:  76 year old male with Atrial Fibrillation, HTN, HLD presents with the above complaints.  Symptoms x 10 days. Reports chest congestion, rhinorrhea, cough, postnasal drip. Cough is the most troublesome symptom. No fever. No SOB. No relief with Mucinex and Zyrtec.   Patient Active Problem List   Diagnosis Date Noted   Acute bronchitis 02/18/2022   Atrial fibrillation ([place]) 08/17/2021   Prediabetes 07/18/2021   Basal cell carcinoma of skin of scalp and neck 03/22/2021   Statin intolerance 08/12/2020   Hyperlipidemia 11/19/2012   Essential hypertension, benign 11/19/2012   BPH (benign prostatic hyperplasia) 11/19/2012    Social Hx   Social History   Socioeconomic History   Marital status: Married    Spouse name: Ann   Number of children: 2   Years of education: Not on file   Highest education level: Not on file  Occupational History   Not on file  Tobacco Use   Smoking status: Former    Years: 30.00    Types: Cigarettes    Quit date: 04/2010    Years since quitting: 11.8   Smokeless tobacco: Never  Vaping Use   Vaping Use: Never used  Substance and Sexual Activity   Alcohol use: Not Currently   Drug use: Never   Sexual activity: Not on file  Other Topics Concern   Not on file  Social History Narrative   Retired from [employer] in Engineer, materials.   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   1 grandson and 1 granddaughter   2 step grandsons   Social Determinants of Health   Financial Resource Strain: Low Risk  (03/22/2021)   Overall Financial Resource Strain (CARDIA)    Difficulty of Paying Living Expenses: Not hard at all  Food Insecurity: No Food Insecurity (03/22/2021)   Hunger Vital Sign    Worried About Running Out of Food in the Last Year: Never true     Ran Out of Food in the Last Year: Never true  Transportation Needs: No Transportation Needs (03/22/2021)   PRAPARE - Hydrologist (Medical): No    Lack of Transportation (Non-Medical): No  Physical Activity: Insufficiently Active (03/22/2021)   Exercise Vital Sign    Days of Exercise per Week: 4 days    Minutes of Exercise per Session: 30 min  Stress: No Stress Concern Present (03/22/2021)   [place]    Feeling of Stress : Not at all  Social Connections: [place] (03/22/2021)   Social Connection and Isolation Panel [NHANES]    Frequency of Communication with Friends and Family: More than three times a week    Frequency of Social Gatherings with Friends and Family: More than three times a week    Attends Religious Services: More than 4 times per year    Active Member of [employer] or Organizations: Yes    Attends Music therapist: More than 4 times per year    Marital Status: Married    Review of Systems Per HPI  Objective:  BP (!) 154/80   Pulse 68   Temp 97.6 F (36.4 C)   Ht '5\' 7"'$  (1.702 m)  Wt 170 lb (77.1 kg)   SpO2 100%   BMI 26.63 kg/m      02/17/2022   10:32 AM 02/15/2022    1:11 PM 02/15/2022   12:39 PM  BP/Weight  Systolic BP [phone]  Diastolic BP 80 78 82  Wt. (Lbs) 170  170  BMI 26.63 kg/m2  26.63 kg/m2    Physical Exam Vitals and nursing note reviewed.  Constitutional:      General: He is not in acute distress.    Appearance: Normal appearance.  HENT:     Head: Normocephalic and atraumatic.  Cardiovascular:     Rate and Rhythm: Normal rate and regular rhythm.  Pulmonary:     Effort: Pulmonary effort is normal.     Breath sounds: Normal breath sounds. No wheezing, rhonchi or rales.  Neurological:     Mental Status: He is alert.     Lab Results  Component Value Date   WBC 7.2 12/19/2021   HGB 14.4 12/19/2021   HCT 44.3  12/19/2021   PLT 231 12/19/2021   GLUCOSE 94 12/19/2021   CHOL 222 (H) 05/30/2021   TRIG 140 05/30/2021   HDL 44 05/30/2021   LDLCALC 153 (H) 05/30/2021   ALT 18 12/19/2021   AST 23 12/19/2021   NA 139 12/19/2021   K 5.0 12/19/2021   CL 99 12/19/2021   CREATININE 1.00 12/19/2021   BUN 9 12/19/2021   CO2 26 12/19/2021   PSA 3.9 07/27/2014   HGBA1C 5.9 (H) 05/30/2021     Assessment & Plan:   Problem List Items Addressed This Visit       Respiratory   Acute bronchitis - Primary    Treating with Doxycycline and Promethazine DM. Atrovent for rhinitis/postnasal drip.        Meds ordered this encounter  Medications   doxycycline (VIBRA-TABS) 100 MG tablet    Sig: Take 1 tablet (100 mg total) by mouth 2 (two) times daily.    Dispense:  14 tablet    Refill:  0   ipratropium (ATROVENT) 0.06 % nasal spray    Sig: Place 2 sprays into both nostrils 4 (four) times daily as needed for rhinitis.    Dispense:  15 mL    Refill:  0   promethazine-dextromethorphan (PROMETHAZINE-DM) 6.25-15 MG/5ML syrup    Sig: Take 5 mLs by mouth 4 (four) times daily as needed for cough.    Dispense:  118 mL    Refill:  0    Follow-up:  Return if symptoms worsen or fail to improve.  [place]

## 2022-02-18 NOTE — Assessment & Plan Note (Signed)
Treating with Doxycycline and Promethazine DM. Atrovent for rhinitis/postnasal drip.

## 2022-02-22 ENCOUNTER — Other Ambulatory Visit: Payer: Self-pay | Admitting: Family Medicine

## 2022-02-22 ENCOUNTER — Telehealth: Payer: Self-pay

## 2022-02-22 NOTE — Telephone Encounter (Signed)
Please advise. Thank you

## 2022-02-22 NOTE — Telephone Encounter (Signed)
Caller name: TAJON MORING  On DPR?: Yes  Call back number: [phone] (mobile)  Provider they see: Coral Spikes, DO  Reason for call:Pt wife called pt was given  promethazine-dextromethorphan (PROMETHAZINE-DM) 6.25-15 MG/5ML syrup  and pt is out of medication and his cough is still bad wanting to see if something else can be called in or will he need to be rechecked

## 2022-02-23 NOTE — Telephone Encounter (Signed)
Pt contacted and verbalized understanding.  

## 2022-02-24 NOTE — Telephone Encounter (Signed)
Spoke with patient he is requesting more of the cough syrup and abx was seen on 02/17/22 , he still has a dry cough and comes on suddenly no fever or sob, but states he doing better than before. Please advise

## 2022-02-28 ENCOUNTER — Telehealth: Payer: Self-pay | Admitting: Family Medicine

## 2022-02-28 NOTE — Telephone Encounter (Signed)
Patient is seen 11/10 and would like something called in for sinus infection and bad cough

## 2022-03-06 NOTE — Telephone Encounter (Signed)
Pt contacted. Pt states he is feeling better.

## 2022-03-06 NOTE — Telephone Encounter (Signed)
NTBS.

## 2022-03-31 ENCOUNTER — Telehealth: Payer: Self-pay

## 2022-03-31 ENCOUNTER — Ambulatory Visit (INDEPENDENT_AMBULATORY_CARE_PROVIDER_SITE_OTHER): Payer: Medicare HMO

## 2022-03-31 VITALS — BP 128/72 | Ht 67.0 in | Wt 173.4 lb

## 2022-03-31 DIAGNOSIS — Z0000 Encounter for general adult medical examination without abnormal findings: Secondary | ICD-10-CM

## 2022-03-31 NOTE — Patient Instructions (Signed)
[name] , Thank you for taking time to come for your Medicare Wellness Visit. I appreciate your ongoing commitment to your health goals. Please review the following plan we discussed and let me know if I can assist you in the future.   These are the goals we discussed:  Goals      Exercise 3x per week (30 min per time)     Pt would like to continue to exercise, work out in the yard, [employer].         This is a list of the screening recommended for you and due dates:  Health Maintenance  Topic Date Due   DTaP/Tdap/Td vaccine (2 - Tdap) 09/15/2019   Zoster (Shingles) Vaccine (1 of 2) 05/20/2022*   Flu Shot  07/09/2022*   Medicare Annual Wellness Visit  04/01/2023   Colon Cancer Screening  02/12/2024   Pneumonia Vaccine  Completed   HPV Vaccine  Aged Out   COVID-19 Vaccine  Discontinued   Hepatitis C Screening: USPSTF Recommendation to screen - Ages 18-79 yo.  Discontinued  *Topic was postponed. The date shown is not the original due date.    Advanced directives: Please bring a copy of your health care power of attorney and living will to the office to be added to your chart at your convenience.   Conditions/risks identified: Aim for 30 minutes of exercise or brisk walking, 6-8 glasses of water, and 5 servings of fruits and vegetables each day.   Next appointment: Follow up in one year for your annual wellness visit.   Preventive Care 69 Years and Older, Male  Preventive care refers to lifestyle choices and visits with your health care provider that can promote health and wellness. What does preventive care include? A yearly physical exam. This is also called an annual well check. Dental exams once or twice a year. Routine eye exams. Ask your health care provider how often you should have your eyes checked. Personal lifestyle choices, including: Daily care of your teeth and gums. Regular physical activity. Eating a healthy diet. Avoiding tobacco and drug use. Limiting  alcohol use. Practicing safe sex. Taking low doses of aspirin every day. Taking vitamin and mineral supplements as recommended by your health care provider. What happens during an annual well check? The services and screenings done by your health care provider during your annual well check will depend on your age, overall health, lifestyle risk factors, and family history of disease. Counseling  Your health care provider may ask you questions about your: Alcohol use. Tobacco use. Drug use. Emotional well-being. Home and relationship well-being. Sexual activity. Eating habits. History of falls. Memory and ability to understand (cognition). Work and work Statistician. Screening  You may have the following tests or measurements: Height, weight, and BMI. Blood pressure. Lipid and cholesterol levels. These may be checked every 5 years, or more frequently if you are over 1 years old. Skin check. Lung cancer screening. You may have this screening every year starting at age 61 if you have a 30-pack-year history of smoking and currently smoke or have quit within the past 15 years. Fecal occult blood test (FOBT) of the stool. You may have this test every year starting at age 72. Flexible sigmoidoscopy or colonoscopy. You may have a sigmoidoscopy every 5 years or a colonoscopy every 10 years starting at age 75. Prostate cancer screening. Recommendations will vary depending on your family history and other risks. Hepatitis C blood test. Hepatitis B blood test. Sexually  transmitted disease (STD) testing. Diabetes screening. This is done by checking your blood sugar (glucose) after you have not eaten for a while (fasting). You may have this done every 1-3 years. Abdominal aortic aneurysm (AAA) screening. You may need this if you are a current or former smoker. Osteoporosis. You may be screened starting at age 40 if you are at high risk. Talk with your health care provider about your test results,  treatment options, and if necessary, the need for more tests. Vaccines  Your health care provider may recommend certain vaccines, such as: Influenza vaccine. This is recommended every year. Tetanus, diphtheria, and acellular pertussis (Tdap, Td) vaccine. You may need a Td booster every 10 years. Zoster vaccine. You may need this after age 42. Pneumococcal 13-valent conjugate (PCV13) vaccine. One dose is recommended after age 18. Pneumococcal polysaccharide (PPSV23) vaccine. One dose is recommended after age 72. Talk to your health care provider about which screenings and vaccines you need and how often you need them. This information is not intended to replace advice given to you by your health care provider. Make sure you discuss any questions you have with your health care provider. Document Released: 04/23/2015 Document Revised: 12/15/2015 Document Reviewed: 01/26/2015 Elsevier Interactive Patient Education  2017 [place] Prevention in the Home Falls can cause injuries. They can happen to people of all ages. There are many things you can do to make your home safe and to help prevent falls. What can I do on the outside of my home? Regularly fix the edges of walkways and driveways and fix any cracks. Remove anything that might make you trip as you walk through a door, such as a raised step or threshold. Trim any bushes or trees on the path to your home. Use bright outdoor lighting. Clear any walking paths of anything that might make someone trip, such as rocks or tools. Regularly check to see if handrails are loose or broken. Make sure that both sides of any steps have handrails. Any raised decks and porches should have guardrails on the edges. Have any leaves, snow, or ice cleared regularly. Use sand or salt on walking paths during winter. Clean up any spills in your garage right away. This includes oil or grease spills. What can I do in the bathroom? Use night  lights. Install grab bars by the toilet and in the tub and shower. Do not use towel bars as grab bars. Use non-skid mats or decals in the tub or shower. If you need to sit down in the shower, use a plastic, non-slip stool. Keep the floor dry. Clean up any water that spills on the floor as soon as it happens. Remove soap buildup in the tub or shower regularly. Attach bath mats securely with double-sided non-slip rug tape. Do not have throw rugs and other things on the floor that can make you trip. What can I do in the bedroom? Use night lights. Make sure that you have a light by your bed that is easy to reach. Do not use any sheets or blankets that are too big for your bed. They should not hang down onto the floor. Have a firm chair that has side arms. You can use this for support while you get dressed. Do not have throw rugs and other things on the floor that can make you trip. What can I do in the kitchen? Clean up any spills right away. Avoid walking on wet floors. Keep items that you use  a lot in easy-to-reach places. If you need to reach something above you, use a strong step stool that has a grab bar. Keep electrical cords out of the way. Do not use floor polish or wax that makes floors slippery. If you must use wax, use non-skid floor wax. Do not have throw rugs and other things on the floor that can make you trip. What can I do with my stairs? Do not leave any items on the stairs. Make sure that there are handrails on both sides of the stairs and use them. Fix handrails that are broken or loose. Make sure that handrails are as long as the stairways. Check any carpeting to make sure that it is firmly attached to the stairs. Fix any carpet that is loose or worn. Avoid having throw rugs at the top or bottom of the stairs. If you do have throw rugs, attach them to the floor with carpet tape. Make sure that you have a light switch at the top of the stairs and the bottom of the stairs. If  you do not have them, ask someone to add them for you. What else can I do to help prevent falls? Wear shoes that: Do not have high heels. Have rubber bottoms. Are comfortable and fit you well. Are closed at the toe. Do not wear sandals. If you use a stepladder: Make sure that it is fully opened. Do not climb a closed stepladder. Make sure that both sides of the stepladder are locked into place. Ask someone to hold it for you, if possible. Clearly mark and make sure that you can see: Any grab bars or handrails. First and last steps. Where the edge of each step is. Use tools that help you move around (mobility aids) if they are needed. These include: Canes. Walkers. Scooters. Crutches. Turn on the lights when you go into a dark area. Replace any light bulbs as soon as they burn out. Set up your furniture so you have a clear path. Avoid moving your furniture around. If any of your floors are uneven, fix them. If there are any pets around you, be aware of where they are. Review your medicines with your doctor. Some medicines can make you feel dizzy. This can increase your chance of falling. Ask your doctor what other things that you can do to help prevent falls. This information is not intended to replace advice given to you by your health care provider. Make sure you discuss any questions you have with your health care provider. Document Released: 01/21/2009 Document Revised: 09/02/2015 Document Reviewed: 05/01/2014 Elsevier Interactive Patient Education  2017 [employer].

## 2022-03-31 NOTE — Progress Notes (Signed)
Subjective:   Joshua Alvarez is a 76 y.o. male who presents for Medicare Annual/Subsequent preventive examination.  Review of Systems     Cardiac Risk Factors include: advanced age (>26mn, >>2women);dyslipidemia;hypertension;male gender     Objective:    Today's Vitals   03/31/22 1305  BP: 128/72  Weight: 173 lb 6.4 oz (78.7 kg)  Height: '5\' 7"'$  (1.702 m)   Body mass index is 27.16 kg/m.     03/31/2022    1:19 PM 08/26/2021   11:14 PM 08/19/2021    8:46 AM 08/05/2021    2:30 PM 08/05/2021    8:37 AM 08/04/2021    4:52 PM 03/22/2021    1:57 PM  Advanced Directives  Does Patient Have a Medical Advance Directive? Yes No No No No No Yes  Type of Advance Directive Living will;Healthcare Power of [place]gent[place] will  Does patient want to make changes to medical advance directive? No - Patient declined        [place]? No - copy requested      No - copy requested  Would patient like information on creating a medical advance directive?  Yes (MAU/Ambulatory/Procedural Areas - Information given) Yes (MAU/Ambulatory/Procedural Areas - Information given) No - Patient declined No - Patient declined No - Patient declined No - Patient declined    Current Medications (verified) Outpatient Encounter Medications as of 03/31/2022  Medication Sig   amLODipine (NORVASC) 5 MG tablet Take 0.5 tablets (2.5 mg total) by mouth daily.   apixaban (ELIQUIS) 5 MG TABS tablet TAKE ONE TABLET BY MOUTH TWICE A DAY   atenolol (TENORMIN) 25 MG tablet Take 1 tablet (25 mg total) by mouth daily.   ezetimibe (ZETIA) 10 MG tablet Take 10 mg by mouth daily.   ipratropium (ATROVENT) 0.06 % nasal spray Place 2 sprays into both nostrils 4 (four) times daily as needed for rhinitis.   promethazine-dextromethorphan (PROMETHAZINE-DM) 6.25-15 MG/5ML syrup TAKE 5ML'S BY MOUTH FOUR TIMES DAILY AS NEEDED FOR COUGH   sildenafil (VIAGRA) 50 MG tablet  Take 1 tablet (50 mg total) by mouth daily as needed for erectile dysfunction.   [DISCONTINUED] doxycycline (VIBRA-TABS) 100 MG tablet Take 1 tablet (100 mg total) by mouth 2 (two) times daily.   No facility-administered encounter medications on file as of 03/31/2022.    Allergies (verified) Statins, Clarithromycin, and Enalapril   History: Past Medical History:  Diagnosis Date   Allergic rhinitis    Anemia    Arthritis    Bladder spasms    Colon polyp    History of kidney stones    x 3    Hypertension    Migraine headache    Paroxysmal atrial fibrillation ([facility]    Diagnosed April 2023   Skin cancer    Urgency of urination    Past Surgical History:  Procedure Laterality Date   COLONOSCOPY     COLONOSCOPY N/A 02/11/2014   Procedure: COLONOSCOPY;  Surgeon: NRogene Houston MD;  [facility];  Service: Endoscopy;  Laterality: N/A;  730   CYSTOSCOPY     x 2 - stone removal   FINGER SURGERY Right    right 5th finger   INGUINAL HERNIA REPAIR Bilateral    INSERTION OF MESH Left 05/04/2020   Procedure: INSERTION OF MESH;  Surgeon: RRalene Ok MD;  [place] [facility]  Service: General;  Laterality: Left;   RIGHT KNEE  REPAIRED     TRANSURETHRAL RESECTION OF PROSTATE N/A 08/26/2021   Procedure: TRANSURETHRAL RESECTION OF THE PROSTATE (TURP);  Surgeon: Ardis Hughs, MD;  Location: [facility];  Service: Urology;  Laterality: N/A;   XI ROBOTIC ASSISTED INGUINAL HERNIA REPAIR WITH MESH Left 05/04/2020   Procedure: XI ROBOTIC ASSISTED LEFT INGUINAL HERNIA REPAIR WITH MESH;  Surgeon: Ralene Ok, MD;  Location: [place];  Service: General;  Laterality: Left;   Family History  Problem Relation Age of Onset   Cancer Mother        Breast   Heart attack Father    Social History   Socioeconomic History   Marital status: Married    Spouse name: Lelon Frohlich   Number of children: 2   Years of education: Not on file   Highest education level: Not on file  Occupational History    Not on file  Tobacco Use   Smoking status: Former    Years: 30.00    Types: Cigarettes    Quit date: 04/2010    Years since quitting: 11.9   Smokeless tobacco: Never  Vaping Use   Vaping Use: Never used  Substance and Sexual Activity   Alcohol use: Not Currently   Drug use: Never   Sexual activity: Not on file  Other Topics Concern   Not on file  Social History Narrative   Retired from [employer] in Engineer, materials.   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   1 grandson and 1 granddaughter   2 step grandsons   Social Determinants of Health   Financial Resource Strain: Low Risk  (03/31/2022)   Overall Financial Resource Strain (CARDIA)    Difficulty of Paying Living Expenses: Not hard at all  Food Insecurity: No Food Insecurity (03/31/2022)   Hunger Vital Sign    Worried About Running Out of Food in the Last Year: Never true    Ran Out of Food in the Last Year: Never true  Transportation Needs: No Transportation Needs (03/31/2022)   PRAPARE - Hydrologist (Medical): No    Lack of Transportation (Non-Medical): No  Physical Activity: Insufficiently Active (03/31/2022)   Exercise Vital Sign    Days of Exercise per Week: 2 days    Minutes of Exercise per Session: 60 min  Stress: No Stress Concern Present (03/31/2022)   [place]    Feeling of Stress : Not at all  Social Connections: [place] (03/31/2022)   Social Connection and Isolation Panel [NHANES]    Frequency of Communication with Friends and Family: More than three times a week    Frequency of Social Gatherings with Friends and Family: Three times a week    Attends Religious Services: More than 4 times per year    Active Member of Clubs or Organizations: Yes    Attends Music therapist: More than 4 times per year    Marital Status: Married    Tobacco Counseling Counseling given: Not Answered   Clinical  Intake:  Pre-visit preparation completed: Yes  Pain : No/denies pain  Diabetes: No  How often do you need to have someone help you when you read instructions, pamphlets, or other written materials from your doctor or pharmacy?: 1 - Never  Diabetic?No   Interpreter Needed?: No  Information entered by :: Denman George LPN   Activities of Daily Living    03/31/2022    1:07 PM 03/27/2022   11:55 AM  In your present state of health, do you have any difficulty performing the following activities:  Hearing? 0 0  Vision? 0 0  Difficulty concentrating or making decisions? 0 0  Walking or climbing stairs? 0 0  Dressing or bathing? 0 0  Doing errands, shopping? 0 0  Preparing Food and eating ? N N  Using the Toilet? N N  In the past six months, have you accidently leaked urine? N Y  Do you have problems with loss of bowel control? N N  Managing your Medications? N N  Managing your Finances? N N  Housekeeping or managing your Housekeeping? N N    Patient Care Team: Coral Spikes, DO as PCP - General (Family Medicine) Satira Sark, MD as PCP - Cardiology (Cardiology)  Indicate any recent Medical Services you may have received from other than [facility] providers in the past year (date may be approximate).     Assessment:   This is a routine wellness examination for [place].  Hearing/Vision screen Hearing Screening - Comments:: Wears bilateral hearing aids   Vision Screening - Comments:: up to date with routine eye exams with Dr. [place]    Dietary issues and exercise activities discussed:     Goals Addressed             This Visit's Progress    Exercise 3x per week (30 min per time)   On track    Pt would like to continue to exercise, work out in the yard, [employer].       Depression Screen    03/31/2022    1:18 PM 12/19/2021    9:59 AM 03/22/2021    1:54 PM 09/14/2020    2:57 PM 08/12/2020   10:17 AM 02/10/2020   10:32 AM 10/10/2017    1:21 PM  PHQ 2/9 Scores   PHQ - 2 Score 0 0 0 0 0 0 0    Fall Risk    03/31/2022    1:06 PM 03/27/2022   11:55 AM 12/19/2021    9:59 AM 03/22/2021    1:58 PM 03/16/2021   11:41 AM  [place] in the past year? 0 0 0 0 0  Number falls in past yr: 0 1 0 0 0  Injury with Fall? 0 0 0 0 0  Risk for fall due to : No Fall Risks  No Fall Risks No Fall Risks   Follow up Falls evaluation completed;Education provided;Falls prevention discussed  Falls evaluation completed Falls prevention discussed     FALL RISK PREVENTION PERTAINING TO THE HOME:  Any stairs in or around the home? Yes  If so, are there any without handrails? No  Home free of loose throw rugs in walkways, pet beds, electrical cords, etc? Yes  Adequate lighting in your home to reduce risk of falls? Yes   ASSISTIVE DEVICES UTILIZED TO PREVENT FALLS:  Life alert? No  Use of a cane, walker or w/c? No  Grab bars in the bathroom? Yes  Shower chair or bench in shower? No  Elevated toilet seat or a handicapped toilet? Yes   TIMED UP AND GO:  Was the test performed? Yes .  Length of time to ambulate 10 feet: 5 sec.   Gait steady and fast without use of assistive device  Cognitive Function:        03/31/2022    1:19 PM 03/22/2021    2:00 PM  6CIT Screen  What Year? 0 points 0  points  What month? 0 points 0 points  What time? 0 points 0 points  Count back from 20 0 points 0 points  Months in reverse 0 points 0 points  Repeat phrase 0 points 4 points  Total Score 0 points 4 points    Immunizations Immunization History  Administered Date(s) Administered   Fluad Quad(high Dose 65+) 02/10/2020   Influenza, Seasonal, Injecte, Preservative Fre 01/08/2015   Influenza,inj,Quad PF,6+ Mos 01/02/2014, 03/22/2016, 04/05/2017, 03/11/2018   Moderna Sars-Covid-2 Vaccination 05/14/2019, 06/11/2019   Pneumococcal Conjugate-13 07/30/2014   Pneumococcal Polysaccharide-23 10/02/2016   Td 09/14/2009   Zoster, Live 05/26/2008    TDAP status:  Due, Education has been provided regarding the importance of this vaccine. Advised may receive this vaccine at local pharmacy or Health Dept. Aware to provide a copy of the vaccination record if obtained from local pharmacy or Health Dept. Verbalized acceptance and understanding.  Flu Vaccine status: Due, Education has been provided regarding the importance of this vaccine. Advised may receive this vaccine at local pharmacy or Health Dept. Aware to provide a copy of the vaccination record if obtained from local pharmacy or Health Dept. Verbalized acceptance and understanding.  Pneumococcal vaccine status: Up to date  Covid-19 vaccine status: Information provided on how to obtain vaccines.   Qualifies for Shingles Vaccine? Yes   Zostavax completed No   Shingrix Completed?: Yes  Screening Tests Health Maintenance  Topic Date Due   DTaP/Tdap/Td (2 - Tdap) 09/15/2019   Zoster Vaccines- Shingrix (1 of 2) 05/20/2022 (Originally 08/01/1964)   INFLUENZA VACCINE  07/09/2022 (Originally 11/08/2021)   Medicare Annual Wellness (AWV)  04/01/2023   COLONOSCOPY (Pts 45-72yr Insurance coverage will need to be confirmed)  02/12/2024   Pneumonia Vaccine 75 Years old  Completed   HPV VACCINES  Aged Out   COVID-19 Vaccine  Discontinued   Hepatitis C Screening  Discontinued    Health Maintenance  Health Maintenance Due  Topic Date Due   DTaP/Tdap/Td (2 - Tdap) 09/15/2019    Colorectal cancer screening: No longer required.   Lung Cancer Screening: (Low Dose CT Chest recommended if Age 75-80years, 30 pack-year currently smoking OR have quit w/in 15years.) does not qualify.   Lung Cancer Screening Referral: n/a  Additional Screening:  Hepatitis C Screening: does not qualify  Vision Screening: Recommended annual ophthalmology exams for early detection of glaucoma and other disorders of the eye. Is the patient up to date with their annual eye exam?  Yes  Who is the provider or what is the name of  the office in which the patient attends annual eye exams? Dr. [place] If pt is not established with a provider, would they like to be referred to a provider to establish care? No .   Dental Screening: Recommended annual dental exams for proper oral hygiene  Community Resource Referral / Chronic Care Management: CRR required this visit?  No   CCM required this visit?  No      Plan:     I have personally reviewed and noted the following in the patient's chart:   Medical and social history Use of alcohol, tobacco or illicit drugs  Current medications and supplements including opioid prescriptions. Patient is not currently taking opioid prescriptions. Functional ability and status Nutritional status Physical activity Advanced directives List of other physicians Hospitalizations, surgeries, and ER visits in previous 12 months Vitals Screenings to include cognitive, depression, and falls Referrals and appointments  In addition, I have reviewed and discussed with  patient certain preventive protocols, quality metrics, and best practice recommendations. A written personalized care plan for preventive services as well as general preventive health recommendations were provided to patient.     Denman George [place], [place]   [id number]/[id number]   Nurse Notes: Patient is concerned that Zetia may be causing muscle aches.  Would like to know if he can have a trial of not taking it to see if this is the problem.  Will route concern as phone note.

## 2022-03-31 NOTE — Telephone Encounter (Signed)
Patient is concerned that Zetia may be causing muscle aches.  Would like to know if he can have a trial of not taking it to see if this is the problem.

## 2022-03-31 NOTE — Telephone Encounter (Signed)
Called to confirm with patient whether he would like visit to be in office or over the phone.  LVM

## 2022-04-17 DIAGNOSIS — H903 Sensorineural hearing loss, bilateral: Secondary | ICD-10-CM | POA: Diagnosis not present

## 2022-05-31 ENCOUNTER — Ambulatory Visit: Payer: Medicare HMO | Admitting: Cardiology

## 2022-06-12 DIAGNOSIS — H903 Sensorineural hearing loss, bilateral: Secondary | ICD-10-CM | POA: Diagnosis not present

## 2022-06-23 DIAGNOSIS — R69 Illness, unspecified: Secondary | ICD-10-CM | POA: Diagnosis not present

## 2022-07-17 DIAGNOSIS — H9313 Tinnitus, bilateral: Secondary | ICD-10-CM | POA: Diagnosis not present

## 2022-07-17 DIAGNOSIS — Z77122 Contact with and (suspected) exposure to noise: Secondary | ICD-10-CM | POA: Diagnosis not present

## 2022-07-17 DIAGNOSIS — H903 Sensorineural hearing loss, bilateral: Secondary | ICD-10-CM | POA: Diagnosis not present

## 2022-08-02 DIAGNOSIS — Z85828 Personal history of other malignant neoplasm of skin: Secondary | ICD-10-CM | POA: Diagnosis not present

## 2022-08-02 DIAGNOSIS — D485 Neoplasm of uncertain behavior of skin: Secondary | ICD-10-CM | POA: Diagnosis not present

## 2022-08-02 DIAGNOSIS — D2271 Melanocytic nevi of right lower limb, including hip: Secondary | ICD-10-CM | POA: Diagnosis not present

## 2022-08-02 DIAGNOSIS — L578 Other skin changes due to chronic exposure to nonionizing radiation: Secondary | ICD-10-CM | POA: Diagnosis not present

## 2022-08-02 DIAGNOSIS — D2272 Melanocytic nevi of left lower limb, including hip: Secondary | ICD-10-CM | POA: Diagnosis not present

## 2022-08-02 DIAGNOSIS — L814 Other melanin hyperpigmentation: Secondary | ICD-10-CM | POA: Diagnosis not present

## 2022-08-02 DIAGNOSIS — D2372 Other benign neoplasm of skin of left lower limb, including hip: Secondary | ICD-10-CM | POA: Diagnosis not present

## 2022-08-02 DIAGNOSIS — L821 Other seborrheic keratosis: Secondary | ICD-10-CM | POA: Diagnosis not present

## 2022-08-02 DIAGNOSIS — D225 Melanocytic nevi of trunk: Secondary | ICD-10-CM | POA: Diagnosis not present

## 2022-08-02 DIAGNOSIS — L570 Actinic keratosis: Secondary | ICD-10-CM | POA: Diagnosis not present

## 2022-08-02 DIAGNOSIS — H9190 Unspecified hearing loss, unspecified ear: Secondary | ICD-10-CM | POA: Diagnosis not present

## 2022-08-18 ENCOUNTER — Other Ambulatory Visit: Payer: Self-pay | Admitting: Physician Assistant

## 2022-08-18 ENCOUNTER — Other Ambulatory Visit: Payer: Self-pay | Admitting: Internal Medicine

## 2022-08-18 NOTE — Telephone Encounter (Signed)
Prescription refill request for Eliquis received. Indication: afib  Last office visit: Strader, 02/15/2022 Scr: 1.0, 12/19/2021 Age: 77 yo  Weight: 78.7 kg   Refill sent.

## 2022-09-13 ENCOUNTER — Other Ambulatory Visit: Payer: Self-pay

## 2022-09-13 ENCOUNTER — Ambulatory Visit: Payer: Medicare HMO | Attending: Cardiology | Admitting: Cardiology

## 2022-09-13 ENCOUNTER — Encounter: Payer: Self-pay | Admitting: Cardiology

## 2022-09-13 VITALS — BP 126/86 | HR 95 | Ht 67.0 in | Wt 175.0 lb

## 2022-09-13 DIAGNOSIS — I4819 Other persistent atrial fibrillation: Secondary | ICD-10-CM | POA: Diagnosis not present

## 2022-09-13 DIAGNOSIS — I10 Essential (primary) hypertension: Secondary | ICD-10-CM | POA: Diagnosis not present

## 2022-09-13 DIAGNOSIS — I480 Paroxysmal atrial fibrillation: Secondary | ICD-10-CM

## 2022-09-13 NOTE — Progress Notes (Signed)
    Cardiology Office Note  Date: 09/13/2022   ID: Joshua Alvarez, DOB 12-21-1945, MRN [id number]  History of Present Illness: Joshua Alvarez is a 77 y.o. male last seen in November 2023 by Ms. Strader PA-C, I reviewed the note.  He is here for a routine visit.  Reports an intermittent sense of palpitations, is noted to be in atrial fibrillation today by ECG which I reviewed.  After a few minutes of being seated his heart rate is in the 80s at rest on atenolol 25 mg daily.  He reports compliance with Eliquis and no spontaneous bleeding problems.  No major change in stamina with baseline activities.  I reviewed his last lab work.  Physical Exam: VS:  BP 126/86   Pulse 95   Ht 5\' 7"  (1.702 m)   Wt 175 lb (79.4 kg)   SpO2 97%   BMI 27.41 kg/m , BMI Body mass index is 27.41 kg/m.  Wt Readings from Last 3 Encounters:  09/13/22 175 lb (79.4 kg)  03/31/22 173 lb 6.4 oz (78.7 kg)  02/17/22 170 lb (77.1 kg)    General: Patient appears comfortable at rest. HEENT: Conjunctiva and lids normal. Neck: Supple, no elevated JVP or carotid bruits. Lungs: Clear to auscultation, nonlabored breathing at rest. Cardiac: Irregularly irregular, 1/6 systolic murmur. Extremities: No pitting edema.  ECG:  An ECG dated 02/28/2022 was personally reviewed today and demonstrated:  Atrial fibrillation.  Labwork: 12/19/2021: ALT 18; AST 23; BUN 9; Creatinine, Ser 1.00; Hemoglobin 14.4; Platelets 231; Potassium 5.0; Sodium 139     Component Value Date/Time   CHOL 222 (H) 05/30/2021 0826   TRIG 140 05/30/2021 0826   HDL 44 05/30/2021 0826   CHOLHDL 5.0 05/30/2021 0826   CHOLHDL 3.7 12/23/2013 0809   VLDL 25 12/23/2013 0809   LDLCALC 153 (H) 05/30/2021 0826   Other Studies Reviewed Today:  Echocardiogram 07/29/2021:  1. Left ventricular ejection fraction, by estimation, is 60 to 65%. The  left ventricle has normal function. The left ventricle has no regional  wall motion abnormalities. Left ventricular  diastolic parameters are  indeterminate.   2. Right ventricular systolic function is normal. The right ventricular  size is normal.   3. The mitral valve is normal in structure. Mild mitral valve  regurgitation.   4. The aortic valve is abnormal. Aortic valve regurgitation is mild.  Aortic valve sclerosis/calcification is present, without any evidence of  aortic stenosis.   Assessment and Plan:  1.  Persistent atrial fibrillation with CHA2DS2-VASc score of 4.  He is in atrial fibrillation today, not substantially symptomatic and with heart rate in the 80s at rest on atenolol 25 mg daily.  He did not tolerate higher dose atenolol when in sinus rhythm previously due to bradycardia.  Plan to continue with current dose, also on Eliquis for stroke prophylaxis.  2.  Essential hypertension.  Blood pressure control is adequate today.  He is also on Norvasc.  3.  Mixed hyperlipidemia.  Continue Zetia.  Disposition:  Follow up  6 months.  Signed, Jonelle Sidle, M.D., F.A.C.C. [place] [facility]

## 2022-09-13 NOTE — Patient Instructions (Signed)
Medication Instructions:  Your physician recommends that you continue on your current medications as directed. Please refer to the Current Medication list given to you today.   Labwork: None today  Testing/Procedures: None today  Follow-Up: 6 months Dr.McDowell  Any Other Special Instructions Will Be Listed Below (If Applicable).  If you need a refill on your cardiac medications before your next appointment, please call your pharmacy.  

## 2022-09-14 DIAGNOSIS — C44329 Squamous cell carcinoma of skin of other parts of face: Secondary | ICD-10-CM | POA: Diagnosis not present

## 2022-09-25 NOTE — Progress Notes (Signed)
Order(s) created erroneously. Erroneous order ID: [id number]  Order moved by: Leonia Corona  Order move date/time: 09/25/2022 4:28 PM  Source Patient: [id number]  Source Contact: 09/13/2022  Destination Patient: [id number]  Destination Contact: 09/20/2022

## 2022-09-28 DIAGNOSIS — D0422 Carcinoma in situ of skin of left ear and external auricular canal: Secondary | ICD-10-CM | POA: Diagnosis not present

## 2022-10-03 DIAGNOSIS — C44519 Basal cell carcinoma of skin of other part of trunk: Secondary | ICD-10-CM | POA: Diagnosis not present

## 2022-11-07 ENCOUNTER — Other Ambulatory Visit: Payer: Self-pay | Admitting: Cardiology

## 2023-02-07 DIAGNOSIS — D3132 Benign neoplasm of left choroid: Secondary | ICD-10-CM | POA: Diagnosis not present

## 2023-02-07 DIAGNOSIS — Z0100 Encounter for examination of eyes and vision without abnormal findings: Secondary | ICD-10-CM | POA: Diagnosis not present

## 2023-02-13 DIAGNOSIS — R351 Nocturia: Secondary | ICD-10-CM | POA: Diagnosis not present

## 2023-02-13 DIAGNOSIS — N401 Enlarged prostate with lower urinary tract symptoms: Secondary | ICD-10-CM | POA: Diagnosis not present

## 2023-02-20 DIAGNOSIS — C4441 Basal cell carcinoma of skin of scalp and neck: Secondary | ICD-10-CM | POA: Diagnosis not present

## 2023-02-20 DIAGNOSIS — L570 Actinic keratosis: Secondary | ICD-10-CM | POA: Diagnosis not present

## 2023-02-20 DIAGNOSIS — Z85828 Personal history of other malignant neoplasm of skin: Secondary | ICD-10-CM | POA: Diagnosis not present

## 2023-02-20 DIAGNOSIS — D225 Melanocytic nevi of trunk: Secondary | ICD-10-CM | POA: Diagnosis not present

## 2023-02-20 DIAGNOSIS — D2272 Melanocytic nevi of left lower limb, including hip: Secondary | ICD-10-CM | POA: Diagnosis not present

## 2023-02-20 DIAGNOSIS — D2271 Melanocytic nevi of right lower limb, including hip: Secondary | ICD-10-CM | POA: Diagnosis not present

## 2023-02-20 DIAGNOSIS — L814 Other melanin hyperpigmentation: Secondary | ICD-10-CM | POA: Diagnosis not present

## 2023-02-20 DIAGNOSIS — C44519 Basal cell carcinoma of skin of other part of trunk: Secondary | ICD-10-CM | POA: Diagnosis not present

## 2023-02-20 DIAGNOSIS — L821 Other seborrheic keratosis: Secondary | ICD-10-CM | POA: Diagnosis not present

## 2023-02-20 DIAGNOSIS — D2372 Other benign neoplasm of skin of left lower limb, including hip: Secondary | ICD-10-CM | POA: Diagnosis not present

## 2023-02-20 DIAGNOSIS — D485 Neoplasm of uncertain behavior of skin: Secondary | ICD-10-CM | POA: Diagnosis not present

## 2023-02-20 DIAGNOSIS — C44311 Basal cell carcinoma of skin of nose: Secondary | ICD-10-CM | POA: Diagnosis not present

## 2023-02-20 DIAGNOSIS — L578 Other skin changes due to chronic exposure to nonionizing radiation: Secondary | ICD-10-CM | POA: Diagnosis not present

## 2023-03-12 ENCOUNTER — Other Ambulatory Visit: Payer: Self-pay | Admitting: Internal Medicine

## 2023-03-12 NOTE — Telephone Encounter (Signed)
Prescription refill request for Eliquis received. Indication:AFIB Last office visit:6/24 UJW:[id number] labs Age: 77 Weight:79.4  kg  Prescription refilled

## 2023-03-19 ENCOUNTER — Ambulatory Visit: Payer: Medicare HMO | Attending: Cardiology | Admitting: Cardiology

## 2023-03-19 ENCOUNTER — Encounter: Payer: Self-pay | Admitting: Cardiology

## 2023-03-19 ENCOUNTER — Other Ambulatory Visit (HOSPITAL_COMMUNITY)
Admission: RE | Admit: 2023-03-19 | Discharge: 2023-03-19 | Disposition: A | Discharge status: Home / Self Care | Payer: Medicare HMO | Source: Ambulatory Visit | Attending: Cardiology | Admitting: Cardiology

## 2023-03-19 VITALS — BP 136/82 | HR 65 | Ht 67.0 in | Wt 176.2 lb

## 2023-03-19 DIAGNOSIS — I10 Essential (primary) hypertension: Secondary | ICD-10-CM

## 2023-03-19 DIAGNOSIS — I4819 Other persistent atrial fibrillation: Secondary | ICD-10-CM | POA: Diagnosis not present

## 2023-03-19 DIAGNOSIS — Z79899 Other long term (current) drug therapy: Secondary | ICD-10-CM | POA: Diagnosis not present

## 2023-03-19 DIAGNOSIS — E782 Mixed hyperlipidemia: Secondary | ICD-10-CM

## 2023-03-19 LAB — BASIC METABOLIC PANEL
Anion gap: 7 (ref 5–15)
BUN: 11 mg/dL (ref 8–23)
CO2: 25 mmol/L (ref 22–32)
Calcium: 9.4 mg/dL (ref 8.9–10.3)
Chloride: 105 mmol/L (ref 98–111)
Creatinine, Ser: 0.97 mg/dL (ref 0.61–1.24)
GFR, Estimated: >60 mL/min (ref >60)
Glucose, Bld: 112 mg/dL — ABNORMAL HIGH (ref 70–99)
Potassium: 4.4 mmol/L (ref 3.5–5.1)
Sodium: 137 mmol/L (ref 135–145)

## 2023-03-19 LAB — CBC
HCT: 48.5 % (ref 39.0–52.0)
Hemoglobin: 15.5 g/dL (ref 13.0–17.0)
MCH: 27 pg (ref 26.0–34.0)
MCHC: 32 g/dL (ref 30.0–36.0)
MCV: 84.5 fL (ref 80.0–100.0)
Platelets: 245 10*3/uL (ref 150–400)
RBC: 5.74 MIL/uL (ref 4.22–5.81)
RDW: 14 % (ref 11.5–15.5)
WBC: 7.9 10*3/uL (ref 4.0–10.5)
nRBC: 0 % (ref 0.0–0.2)

## 2023-03-19 NOTE — Progress Notes (Signed)
    Cardiology Office Note  Date: 03/19/2023   ID: XAIDYN WENNER, DOB 02/19/1946, MRN [id number]  History of Present Illness: Joshua Alvarez is a 77 y.o. male last seen in June.  He is here for a routine visit.  Reports no palpitations or dyspnea beyond NYHA class II.  No exertional chest pain.  I reviewed his medications.  Current cardiovascular regimen includes Eliquis, Norvasc, atenolol, and Zetia.  I rechecked his blood pressure 136/82 today.  He states that he checks blood pressures at home.  Continue to follow for now without medication change.  He is due for follow-up lab work, does not report any spontaneous bleeding problems on Eliquis.  Physical Exam: VS:  BP 136/82 (BP Location: Right Arm)   Pulse 65   Ht 5\' 7"  (1.702 m)   Wt 176 lb 3.2 oz (79.9 kg)   SpO2 98%   BMI 27.60 kg/m , BMI Body mass index is 27.6 kg/m.  Wt Readings from Last 3 Encounters:  03/19/23 176 lb 3.2 oz (79.9 kg)  09/13/22 175 lb (79.4 kg)  03/31/22 173 lb 6.4 oz (78.7 kg)    General: Patient appears comfortable at rest. HEENT: Conjunctiva and lids normal. Neck: Supple, no elevated JVP or carotid bruits. Lungs: Clear to auscultation, nonlabored breathing at rest. Cardiac: Irregular irregular, soft systolic murmur without gallop. Extremities: No pitting edema.  ECG:  An ECG dated 09/13/2022 was personally reviewed today and demonstrated:  Atrial fibrillation.  Labwork: September 2023: BUN 9, creatinine 1.0, potassium 5.0, AST 23, ALT 18, hemoglobin 14.4, platelets 231    Component Value Date/Time   CHOL 222 (H) 05/30/2021 0826   TRIG 140 05/30/2021 0826   HDL 44 05/30/2021 0826   CHOLHDL 5.0 05/30/2021 0826   CHOLHDL 3.7 12/23/2013 0809   VLDL 25 12/23/2013 0809   LDLCALC 153 (H) 05/30/2021 0826   Other Studies Reviewed Today:  No interval cardiac testing for review today.  Assessment and Plan:  1.  Persistent atrial fibrillation with CHA2DS2-VASc score of 4.  Asymptomatic.   Continue atenolol for heart rate control Eliquis for prophylaxis.  Follow-up CBC and BMET.  He is not reporting spontaneous bleeding problems.   2.  Primary hypertension.  Continue to track blood pressure at home, no adjustments were made today.  He does have room for up titration of Norvasc if necessary.  Continue atenolol.   3.  Mixed hyperlipidemia.  LDL 153 in February 2023.  He follows with Dr. Adriana Simas and is on Zetia with history of statin myalgias.  Disposition:  Follow up  6 months.  Signed, Jonelle Sidle, M.D., F.A.C.C.  [facility]

## 2023-03-19 NOTE — Patient Instructions (Signed)
Medication Instructions:  Your physician recommends that you continue on your current medications as directed. Please refer to the Current Medication list given to you today.   Labwork: CBC,BMET today  Testing/Procedures: None today  Follow-Up: 6 months  Any Other Special Instructions Will Be Listed Below (If Applicable).  If you need a refill on your cardiac medications before your next appointment, please call your pharmacy.

## 2023-04-06 ENCOUNTER — Ambulatory Visit (INDEPENDENT_AMBULATORY_CARE_PROVIDER_SITE_OTHER): Payer: Medicare HMO

## 2023-04-06 DIAGNOSIS — Z0000 Encounter for general adult medical examination without abnormal findings: Secondary | ICD-10-CM

## 2023-04-06 NOTE — Patient Instructions (Signed)
Joshua Alvarez , Thank you for taking time to come for your Medicare Wellness Visit. I appreciate your ongoing commitment to your health goals. Please review the following plan we discussed and let me know if I can assist you in the future.   Referrals/Orders/Follow-Ups/Clinician Recommendations: none  This is a list of the screening recommended for you and due dates:  Health Maintenance  Topic Date Due   Zoster (Shingles) Vaccine (1 of 2) 08/01/1965   DTaP/Tdap/Td vaccine (2 - Tdap) 09/15/2019   Flu Shot  11/09/2022   Medicare Annual Wellness Visit  04/05/2024   Pneumonia Vaccine  Completed   HPV Vaccine  Aged Out   Colon Cancer Screening  Discontinued   COVID-19 Vaccine  Discontinued   Hepatitis C Screening  Discontinued    Advanced directives: (Copy Requested) Please bring a copy of your health care power of attorney and living will to the office to be added to your chart at your convenience.  Next Medicare Annual Wellness Visit scheduled for next year: Yes  insert Preventive Care attachment Insert FALL PREVENTION attachment if needed

## 2023-04-06 NOTE — Progress Notes (Signed)
Subjective:   Joshua Alvarez is a 77 y.o. male who presents for Medicare Annual/Subsequent preventive examination.  Visit Complete: Virtual I connected with  Sharlyn Bologna on 04/06/23 by a audio enabled telemedicine application and verified that I am speaking with the correct person using two identifiers.  Patient Location: Home  Provider Location: Office/Clinic  I discussed the limitations of evaluation and management by telemedicine. The patient expressed understanding and agreed to proceed.  Vital Signs: Because this visit was a virtual/telehealth visit, some criteria may be missing or patient reported. Any vitals not documented were not able to be obtained and vitals that have been documented are patient reported.  Patient Medicare AWV questionnaire was completed by the patient on 04/02/2023; I have confirmed that all information answered by patient is correct and no changes since this date.  Cardiac Risk Factors include: advanced age (>3men, >71 women);male gender;hypertension;dyslipidemia     Objective:    Today's Vitals   There is no height or weight on file to calculate BMI.     04/06/2023    1:12 PM 03/31/2022    1:19 PM 08/26/2021   11:14 PM 08/19/2021    8:46 AM 08/05/2021    2:30 PM 08/05/2021    8:37 AM 08/04/2021    4:52 PM  Advanced Directives  Does Patient Have a Medical Advance Directive? Yes Yes No No No No No  Type of Estate agent of [place];Living will Living will;Healthcare Power of Attorney       Does patient want to make changes to medical advance directive?  No - Patient declined       Copy of Healthcare Power of Attorney in Chart? No - copy requested No - copy requested       Would patient like information on creating a medical advance directive?   Yes (MAU/Ambulatory/Procedural Areas - Information given) Yes (MAU/Ambulatory/Procedural Areas - Information given) No - Patient declined No - Patient declined No - Patient declined     Current Medications (verified) Outpatient Encounter Medications as of 04/06/2023  Medication Sig   amLODipine (NORVASC) 5 MG tablet TAKE 1/2 TABLET BY MOUTH DAILY   apixaban (ELIQUIS) 5 MG TABS tablet Take 1 tablet (5 mg total) by mouth 2 (two) times daily. Needs labs for Eliquis refills at next appt   atenolol (TENORMIN) 25 MG tablet TAKE ONE TABLET (25MG  TOTAL) BY MOUTH DAILY   sildenafil (VIAGRA) 50 MG tablet Take 1 tablet (50 mg total) by mouth daily as needed for erectile dysfunction.   ezetimibe (ZETIA) 10 MG tablet Take 10 mg by mouth daily.   ipratropium (ATROVENT) 0.06 % nasal spray Place 2 sprays into both nostrils 4 (four) times daily as needed for rhinitis.   promethazine-dextromethorphan (PROMETHAZINE-DM) 6.25-15 MG/5ML syrup TAKE 5ML'S BY MOUTH FOUR TIMES DAILY AS NEEDED FOR COUGH   No facility-administered encounter medications on file as of 04/06/2023.    Allergies (verified) Statins, Clarithromycin, and Enalapril   History: Past Medical History:  Diagnosis Date   Allergic rhinitis    Allergy 2010   Food   Anemia    Arthritis    Bladder spasms    Cataract 10/24   Colon polyp    History of kidney stones    x 3    Hypertension    Migraine headache    Paroxysmal atrial fibrillation [facility])    Diagnosed April 2023   Skin cancer    Urgency of urination    Past Surgical History:  Procedure Laterality Date   APPENDECTOMY     COLONOSCOPY     COLONOSCOPY N/A 02/11/2014   Procedure: COLONOSCOPY;  Surgeon: Malissa Hippo, MD;  Location: [facility];  Service: Endoscopy;  Laterality: N/A;  730   CYSTOSCOPY     x 2 - stone removal   FINGER SURGERY Right    right 5th finger   INGUINAL HERNIA REPAIR Bilateral    INSERTION OF MESH Left 05/04/2020   Procedure: INSERTION OF MESH;  Surgeon: Axel Filler, MD;  Location: [facility] OR;  Service: General;  Laterality: Left;   RIGHT KNEE REPAIRED     TRANSURETHRAL RESECTION OF PROSTATE N/A 08/26/2021   Procedure:  TRANSURETHRAL RESECTION OF THE PROSTATE (TURP);  Surgeon: Crist Fat, MD;  Location: [facility] ORS;  Service: Urology;  Laterality: N/A;   XI ROBOTIC ASSISTED INGUINAL HERNIA REPAIR WITH MESH Left 05/04/2020   Procedure: XI ROBOTIC ASSISTED LEFT INGUINAL HERNIA REPAIR WITH MESH;  Surgeon: Axel Filler, MD;  Location: [facility] OR;  Service: General;  Laterality: Left;   Family History  Problem Relation Age of Onset   Cancer Mother        Breast   Heart attack Father    Social History   Socioeconomic History   Marital status: Married    Spouse name: Ann   Number of children: 2   Years of education: Not on file   Highest education level: Not on file  Occupational History   Not on file  Tobacco Use   Smoking status: Former    Current packs/day: 0.00    Average packs/day: 0.3 packs/day for 10.0 years (2.5 ttl pk-yrs)    Types: Cigarettes    Start date: 04/1980    Quit date: 04/2010    Years since quitting: 12.9   Smokeless tobacco: Never  Vaping Use   Vaping status: Never Used  Substance and Sexual Activity   Alcohol use: Not Currently   Drug use: Never   Sexual activity: Not Currently    Birth control/protection: Abstinence  Other Topics Concern   Not on file  Social History Narrative   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   1 grandson and 1 granddaughter   2 step grandsons   Social Drivers of Corporate investment banker Strain: Low Risk  (04/06/2023)   Overall Financial Resource Strain (CARDIA)    Difficulty of Paying Living Expenses: Not hard at all  Food Insecurity: No Food Insecurity (04/06/2023)   Hunger Vital Sign    Worried About Running Out of Food in the Last Year: Never true    Ran Out of Food in the Last Year: Never true  Transportation Needs: No Transportation Needs (04/06/2023)   PRAPARE - Administrator, Civil Service (Medical): No    Lack of Transportation (Non-Medical): No  Physical Activity: Inactive  (04/06/2023)   Exercise Vital Sign    Days of Exercise per Week: 0 days    Minutes of Exercise per Session: 0 min  Stress: No Stress Concern Present (04/06/2023)   [employer] of Occupational Health - Occupational Stress Questionnaire    Feeling of Stress : Not at all  Social Connections: Moderately Integrated (04/06/2023)   Social Connection and Isolation Panel [NHANES]    Frequency of Communication with Friends and Family: More than three times a week    Frequency of Social Gatherings with Friends and Family: Three times a week    Attends Religious  Services: More than 4 times per year    Active Member of Clubs or Organizations: No    Attends Banker Meetings: Never    Marital Status: Married    Tobacco Counseling Counseling given: Not Answered   Clinical Intake:  Pre-visit preparation completed: Yes  Pain : No/denies pain     Nutritional Risks: Nausea/ vomitting/ diarrhea (diarrhea due to medication) Diabetes: No  How often do you need to have someone help you when you read instructions, pamphlets, or other written materials from your doctor or pharmacy?: 1 - Never  Interpreter Needed?: No  Information entered by :: NAllen LPN   Activities of Daily Living    04/02/2023    8:30 PM  In your present state of health, do you have any difficulty performing the following activities:  Hearing? 1  Comment has hearing aids  Vision? 0  Difficulty concentrating or making decisions? 0  Walking or climbing stairs? 0  Dressing or bathing? 0  Doing errands, shopping? 0  Preparing Food and eating ? N  Using the Toilet? N  In the past six months, have you accidently leaked urine? Y  Comment prostate surgery  Do you have problems with loss of bowel control? Y  Managing your Medications? N  Managing your Finances? N  Housekeeping or managing your Housekeeping? N    Patient Care Team: Joshua Sams, DO as PCP - General (Family Medicine) Jonelle Sidle, MD as PCP - Cardiology (Cardiology)  Indicate any recent Medical Services you may have received from other than [facility] providers in the past year (date may be approximate).     Assessment:   This is a routine wellness examination for Joshua Alvarez.  Hearing/Vision screen Hearing Screening - Comments:: Has hearing aids that are maintained Vision Screening - Comments:: Regular eye exams, [facility]   Goals Addressed             This Visit's Progress    Patient Stated       04/06/2023, wants to remodel house       Depression Screen    04/06/2023    1:14 PM 03/31/2022    1:18 PM 12/19/2021    9:59 AM 03/22/2021    1:54 PM 09/14/2020    2:57 PM 08/12/2020   10:17 AM 02/10/2020   10:32 AM  PHQ 2/9 Scores  PHQ - 2 Score 0 0 0 0 0 0 0  PHQ- 9 Score 0          Fall Risk    04/02/2023    8:30 PM 03/31/2022    1:06 PM 03/27/2022   11:55 AM 12/19/2021    9:59 AM 03/22/2021    1:58 PM  Fall Risk   Falls in the past year? 1 0 0 0 0  Comment tripped      Number falls in past yr: 1 0 1 0 0  Injury with Fall? 0 0 0 0 0  Risk for fall due to : Medication side effect No Fall Risks  No Fall Risks No Fall Risks  Follow up Falls prevention discussed;Falls evaluation completed Falls evaluation completed;Education provided;Falls prevention discussed  Falls evaluation completed Falls prevention discussed    MEDICARE RISK AT HOME: Medicare Risk at Home Any stairs in or around the home?: (Patient-Rptd) Yes If so, are there any without handrails?: (Patient-Rptd) No Home free of loose throw rugs in walkways, pet beds, electrical cords, etc?: (Patient-Rptd) No Adequate lighting in your home to  reduce risk of falls?: (Patient-Rptd) Yes Life alert?: (Patient-Rptd) No Use of a cane, walker or w/c?: (Patient-Rptd) No Grab bars in the bathroom?: (Patient-Rptd) Yes Shower chair or bench in shower?: (Patient-Rptd) Yes Elevated toilet seat or a handicapped toilet?: (Patient-Rptd) No  TIMED UP  AND GO:  Was the test performed?  No    Cognitive Function:        04/06/2023    1:15 PM 03/31/2022    1:19 PM 03/22/2021    2:00 PM  6CIT Screen  What Year? 0 points 0 points 0 points  What month? 0 points 0 points 0 points  What time? 0 points 0 points 0 points  Count back from 20 0 points 0 points 0 points  Months in reverse 0 points 0 points 0 points  Repeat phrase 4 points 0 points 4 points  Total Score 4 points 0 points 4 points    Immunizations Immunization History  Administered Date(s) Administered   Fluad Quad(high Dose 65+) 02/10/2020   Influenza, Seasonal, Injecte, Preservative Fre 01/08/2015   Influenza,inj,Quad PF,6+ Mos 01/02/2014, 03/22/2016, 04/05/2017, 03/11/2018   Moderna Sars-Covid-2 Vaccination 05/14/2019, 06/11/2019   Pneumococcal Conjugate-13 07/30/2014   Pneumococcal Polysaccharide-23 10/02/2016   Td 09/14/2009   Zoster, Live 05/26/2008    TDAP status: Due, Education has been provided regarding the importance of this vaccine. Advised may receive this vaccine at local pharmacy or Health Dept. Aware to provide a copy of the vaccination record if obtained from local pharmacy or Health Dept. Verbalized acceptance and understanding.  Flu Vaccine status: Due, Education has been provided regarding the importance of this vaccine. Advised may receive this vaccine at local pharmacy or Health Dept. Aware to provide a copy of the vaccination record if obtained from local pharmacy or Health Dept. Verbalized acceptance and understanding.  Pneumococcal vaccine status: Up to date  Covid-19 vaccine status: Information provided on how to obtain vaccines.   Qualifies for Shingles Vaccine? Yes   Zostavax completed No   Shingrix Completed?: No.    Education has been provided regarding the importance of this vaccine. Patient has been advised to call insurance company to determine out of pocket expense if they have not yet received this vaccine. Advised may also receive  vaccine at local pharmacy or Health Dept. Verbalized acceptance and understanding.  Screening Tests Health Maintenance  Topic Date Due   Zoster Vaccines- Shingrix (1 of 2) 08[birth year]01/1965   DTaP/Tdap/Td (2 - Tdap) 09/15/2019   INFLUENZA VACCINE  11/09/2022   Medicare Annual Wellness (AWV)  04/05/2024   Pneumonia Vaccine 78+ Years old  Completed   HPV VACCINES  Aged Out   Colonoscopy  Discontinued   COVID-19 Vaccine  Discontinued   Hepatitis C Screening  Discontinued    Health Maintenance  Health Maintenance Due  Topic Date Due   Zoster Vaccines- Shingrix (1 of 2) 08/01/1965   DTaP/Tdap/Td (2 - Tdap) 09/15/2019   INFLUENZA VACCINE  11/09/2022    Colorectal cancer screening: No longer required.   Lung Cancer Screening: (Low Dose CT Chest recommended if Age 12-80 years, 20 pack-year currently smoking OR have quit w/in 15years.) does not qualify.   Lung Cancer Screening Referral: no  Additional Screening:  Hepatitis C Screening: does not qualify;  Vision Screening: Recommended annual ophthalmology exams for early detection of glaucoma and other disorders of the eye. Is the patient up to date with their annual eye exam?  Yes  Who is the provider or what is the name of the office  in which the patient attends annual eye exams? [facility] If pt is not established with a provider, would they like to be referred to a provider to establish care? No .   Dental Screening: Recommended annual dental exams for proper oral hygiene  Diabetic Foot Exam: n/a  Community Resource Referral / Chronic Care Management: CRR required this visit?  No   CCM required this visit?  No     Plan:     I have personally reviewed and noted the following in the patient's chart:   Medical and social history Use of alcohol, tobacco or illicit drugs  Current medications and supplements including opioid prescriptions. Patient is not currently taking opioid prescriptions. Functional ability and  status Nutritional status Physical activity Advanced directives List of other physicians Hospitalizations, surgeries, and ER visits in previous 12 months Vitals Screenings to include cognitive, depression, and falls Referrals and appointments  In addition, I have reviewed and discussed with patient certain preventive protocols, quality metrics, and best practice recommendations. A written personalized care plan for preventive services as well as general preventive health recommendations were provided to patient.     Barb Merino, LPN   [id number]/29/5621   After Visit Summary: (MyChart) Due to this being a telephonic visit, the after visit summary with patients personalized plan was offered to patient via MyChart   Nurse Notes: none

## 2023-04-12 ENCOUNTER — Other Ambulatory Visit: Payer: Self-pay | Admitting: Internal Medicine

## 2023-04-12 DIAGNOSIS — I4819 Other persistent atrial fibrillation: Secondary | ICD-10-CM

## 2023-04-12 NOTE — Telephone Encounter (Signed)
 Prescription refill request for Eliquis received. Indication: Afib  Last office visit: 03/19/23 Diona Browner)  Scr: 0.79 (03/19/23)  Age: 78 Weight: 79.9kg  Appropriate dose. Refill set.

## 2023-04-17 DIAGNOSIS — C44311 Basal cell carcinoma of skin of nose: Secondary | ICD-10-CM | POA: Diagnosis not present

## 2023-04-17 DIAGNOSIS — C44519 Basal cell carcinoma of skin of other part of trunk: Secondary | ICD-10-CM | POA: Diagnosis not present

## 2023-04-18 ENCOUNTER — Ambulatory Visit (INDEPENDENT_AMBULATORY_CARE_PROVIDER_SITE_OTHER): Payer: Medicare HMO | Admitting: Family Medicine

## 2023-04-18 ENCOUNTER — Encounter: Payer: Self-pay | Admitting: Family Medicine

## 2023-04-18 VITALS — BP 151/104 | HR 84 | Ht 67.0 in | Wt 177.8 lb

## 2023-04-18 DIAGNOSIS — I10 Essential (primary) hypertension: Secondary | ICD-10-CM

## 2023-04-18 DIAGNOSIS — E782 Mixed hyperlipidemia: Secondary | ICD-10-CM

## 2023-04-18 DIAGNOSIS — I480 Paroxysmal atrial fibrillation: Secondary | ICD-10-CM | POA: Diagnosis not present

## 2023-04-18 DIAGNOSIS — N400 Enlarged prostate without lower urinary tract symptoms: Secondary | ICD-10-CM

## 2023-04-18 MED ORDER — AMLODIPINE BESYLATE 5 MG PO TABS: 5.0000 mg | ORAL_TABLET | Freq: Every day | ORAL | 3 refills | Status: DC

## 2023-04-18 NOTE — Assessment & Plan Note (Addendum)
 Stable. Continue Eliquis and atenolol.

## 2023-04-18 NOTE — Assessment & Plan Note (Signed)
 No issues currently.

## 2023-04-18 NOTE — Progress Notes (Signed)
 Subjective:  Patient ID: Joshua Alvarez, male    DOB: [birth year]  Age: 78 y.o. MRN: [id number]  CC:   Chief Complaint  Patient presents with   Follow-up    HPI:  78 year old male with hypertension, atrial fibrillation, basal cell carcinoma, BPH, prediabetes, hyperlipidemia presents for follow-up.  Blood pressure mildly elevated here today.  He is compliant with amlodipine  and atenolol .  Follows with cardiology regarding atrial fibrillation.  Stable.  He is on atenolol  and Eliquis .  He states that he is eating well.  No abdominal pain.  No chest pain.  No shortness of breath.  Patient no longer taking Zetia  for lipids.  Cannot tolerate due to myalgias/arthralgias.  Cannot tolerate statins either.   Patient Active Problem List   Diagnosis Date Noted   Atrial fibrillation (HCC) 08/17/2021   Prediabetes 07/18/2021   Basal cell carcinoma of skin of scalp and neck 03/22/2021   Statin intolerance 08/12/2020   Hyperlipidemia 11/19/2012   Essential hypertension, benign 11/19/2012   BPH (benign prostatic hyperplasia) 11/19/2012    Social Hx   Social History   Socioeconomic History   Marital status: Married    Spouse name: Ann   Number of children: 2   Years of education: Not on file   Highest education level: Associate degree: occupational, scientist, product/process development, or vocational program  Occupational History   Not on file  Tobacco Use   Smoking status: Former    Current packs/day: 0.00    Average packs/day: 0.3 packs/day for 10.0 years (2.5 ttl pk-yrs)    Types: Cigarettes    Start date: 04/1980    Quit date: 04/2010    Years since quitting: 13.0   Smokeless tobacco: Never  Vaping Use   Vaping status: Never Used  Substance and Sexual Activity   Alcohol use: Not Currently   Drug use: Never   Sexual activity: Not Currently    Birth control/protection: Abstinence  Other Topics Concern   Not on file  Social History Narrative   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   1 son  and 1 daughter.   1 grandson and 1 granddaughter   2 step grandsons   Social Drivers of Corporate Investment Banker Strain: Low Risk  (04/17/2023)   Overall Financial Resource Strain (CARDIA)    Difficulty of Paying Living Expenses: Not hard at all  Food Insecurity: No Food Insecurity (04/17/2023)   Hunger Vital Sign    Worried About Running Out of Food in the Last Year: Never true    Ran Out of Food in the Last Year: Never true  Transportation Needs: No Transportation Needs (04/17/2023)   PRAPARE - Administrator, Civil Service (Medical): No    Lack of Transportation (Non-Medical): No  Physical Activity: Sufficiently Active (04/17/2023)   Exercise Vital Sign    Days of Exercise per Week: 3 days    Minutes of Exercise per Session: 90 min  Recent Concern: Physical Activity - Inactive (04/06/2023)   Exercise Vital Sign    Days of Exercise per Week: 0 days    Minutes of Exercise per Session: 0 min  Stress: Stress Concern Present (04/17/2023)   [employer] of Occupational Health - Occupational Stress Questionnaire    Feeling of Stress : To some extent  Social Connections: Socially Integrated (04/17/2023)   Social Connection and Isolation Panel [NHANES]    Frequency of Communication with Friends and Family: More than three times a week    Frequency of Social  Gatherings with Friends and Family: Three times a week    Attends Religious Services: More than 4 times per year    Active Member of Clubs or Organizations: Yes    Attends Banker Meetings: More than 4 times per year    Marital Status: Married    Review of Systems Per HPI  Objective:  BP (!) 151/104   Pulse 84   Ht 5' 7 (1.702 m)   Wt 177 lb 12.8 oz (80.6 kg)   SpO2 99%   BMI 27.85 kg/m      04/18/2023    8:47 AM 04/18/2023    8:31 AM 04/06/2023    1:06 PM  BP/Weight  Systolic BP 151 139 --  Diastolic BP 104 101 --  Wt. (Lbs)  177.8 --  BMI  27.85 kg/m2     Physical Exam Vitals and nursing  note reviewed.  Constitutional:      General: He is not in acute distress.    Appearance: Normal appearance.  HENT:     Head: Normocephalic and atraumatic.  Eyes:     General:        Right eye: No discharge.        Left eye: No discharge.     Conjunctiva/sclera: Conjunctivae normal.  Cardiovascular:     Comments: Irregularly irregular. Pulmonary:     Effort: Pulmonary effort is normal.     Breath sounds: Normal breath sounds. No wheezing, rhonchi or rales.  Neurological:     Mental Status: He is alert.  Psychiatric:        Mood and Affect: Mood normal.        Behavior: Behavior normal.     Lab Results  Component Value Date   WBC 7.9 03/19/2023   HGB 15.5 03/19/2023   HCT 48.5 03/19/2023   PLT 245 03/19/2023   GLUCOSE 112 (H) 03/19/2023   CHOL 222 (H) 05/30/2021   TRIG 140 05/30/2021   HDL 44 05/30/2021   LDLCALC 153 (H) 05/30/2021   ALT 18 12/19/2021   AST 23 12/19/2021   NA 137 03/19/2023   K 4.4 03/19/2023   CL 105 03/19/2023   CREATININE 0.97 03/19/2023   BUN 11 03/19/2023   CO2 25 03/19/2023   PSA 3.9 07/27/2014   HGBA1C 5.9 (H) 05/30/2021     Assessment & Plan:   Problem List Items Addressed This Visit       Cardiovascular and Mediastinum   Atrial fibrillation (HCC)   Stable.  Continue Eliquis  and atenolol .      Relevant Medications   amLODipine  (NORVASC ) 5 MG tablet   Essential hypertension, benign - Primary   BP elevated here today.  Advised to increase amlodipine  to 5 mg daily.      Relevant Medications   amLODipine  (NORVASC ) 5 MG tablet     Genitourinary   BPH (benign prostatic hyperplasia)   No issues currently.        Other   Hyperlipidemia   Assessing lipids today.  Off Zetia .      Relevant Medications   amLODipine  (NORVASC ) 5 MG tablet   Other Relevant Orders   Lipid panel    Meds ordered this encounter  Medications   amLODipine  (NORVASC ) 5 MG tablet    Sig: Take 1 tablet (5 mg total) by mouth daily.    Dispense:  90  tablet    Refill:  3    Follow-up:  Return in about 6 months (around 10/16/2023).  Idella Lamontagne  DO [facility]

## 2023-04-18 NOTE — Patient Instructions (Addendum)
 Increase the amlodipine to 5 mg daily.  Lab today.  Follow up in 6 months.

## 2023-04-18 NOTE — Assessment & Plan Note (Signed)
 Assessing lipids today.  Off Zetia.

## 2023-04-18 NOTE — Assessment & Plan Note (Signed)
 BP elevated here today.  Advised to increase amlodipine to 5 mg daily.

## 2023-04-19 LAB — LIPID PANEL
Chol/HDL Ratio: 5.1 {ratio} — ABNORMAL HIGH (ref 0.0–5.0)
Cholesterol, Total: 257 mg/dL — ABNORMAL HIGH (ref 100–199)
HDL: 50 mg/dL (ref >39)
LDL Chol Calc (NIH): 161 mg/dL — ABNORMAL HIGH (ref 0–99)
Triglycerides: 246 mg/dL — ABNORMAL HIGH (ref 0–149)
VLDL Cholesterol Cal: 46 mg/dL — ABNORMAL HIGH (ref 5–40)

## 2023-04-23 ENCOUNTER — Encounter: Payer: Self-pay | Admitting: Family Medicine

## 2023-04-26 ENCOUNTER — Telehealth: Payer: Self-pay

## 2023-04-26 NOTE — Telephone Encounter (Signed)
Called pt regarding mychart message about repatha, pt states he has a lot going on right now but will let us know what he decides on at some point

## 2023-04-29 ENCOUNTER — Other Ambulatory Visit: Payer: Self-pay | Admitting: Family Medicine

## 2023-04-29 MED ORDER — REPATHA SURECLICK 140 MG/ML ~~LOC~~ SOAJ: 140.0000 mg | SUBCUTANEOUS | 2 refills | Status: DC

## 2023-05-01 DIAGNOSIS — C4441 Basal cell carcinoma of skin of scalp and neck: Secondary | ICD-10-CM | POA: Diagnosis not present

## 2023-05-24 ENCOUNTER — Ambulatory Visit: Payer: HMO | Admitting: Family Medicine

## 2023-05-24 ENCOUNTER — Ambulatory Visit: Payer: Self-pay | Admitting: Family Medicine

## 2023-05-24 VITALS — BP 146/92 | HR 85 | Temp 98.8°F | Ht 67.0 in | Wt 179.0 lb

## 2023-05-24 DIAGNOSIS — J988 Other specified respiratory disorders: Secondary | ICD-10-CM | POA: Insufficient documentation

## 2023-05-24 DIAGNOSIS — R059 Cough, unspecified: Secondary | ICD-10-CM | POA: Diagnosis not present

## 2023-05-24 MED ORDER — PROMETHAZINE-DM 6.25-15 MG/5ML PO SYRP: 5.0000 mL | ORAL_SOLUTION | Freq: Four times a day (QID) | ORAL | 0 refills | Status: DC | PRN

## 2023-05-24 MED ORDER — OSELTAMIVIR PHOSPHATE 75 MG PO CAPS: 75.0000 mg | ORAL_CAPSULE | Freq: Two times a day (BID) | ORAL | 0 refills | Status: DC

## 2023-05-24 MED ORDER — PREDNISONE 50 MG PO TABS: 50.0000 mg | ORAL_TABLET | Freq: Every day | ORAL | 0 refills | Status: AC

## 2023-05-24 NOTE — Assessment & Plan Note (Signed)
Concern for influenza.  Awaiting test results.  Placing empirically on Tamiflu to cover while awaiting the results.  There is a lot of influenza in our community.  Patient wheezing diffusely on exam.  Placing on corticosteroids.  Promethazine DM for cough.

## 2023-05-24 NOTE — Patient Instructions (Signed)
Medications as prescribed.  Awaiting test results.  Rest. Fluids.

## 2023-05-24 NOTE — Telephone Encounter (Signed)
  Chief Complaint: Sinus Congestion Symptoms: Congestion, Nausea Frequency: Acute Pertinent Negatives: Patient denies chest pain, dyspnea, or fever.  Disposition: [] ED /[] Urgent Care (no appt availability in office) / [x] Appointment(In office/virtual)/ []  [facility]/ [] Home Care/ [] Refused Recommended Disposition /[] [facility]/ []  Follow-up with PCP Additional Notes: Patient contacted via phone regarding concerns of Sinus Congestion and a Productive Cough. Discussed symptoms, severity, and duration. Based on assessment, patient was advised to see PCP within 24 hours. Patient verbalized understanding and agreement with plan. Documentation provided.     Reason for Disposition  [1] Continuous (nonstop) coughing interferes with work or school AND [2] no improvement using cough treatment per Care Advice  Answer Assessment - Initial Assessment Questions 1. ONSET: "When did the cough begin?"      Yesterday afternoon  2. SEVERITY: "How bad is the cough today?"      Persistent Cough  3. SPUTUM: "Describe the color of your sputum" (none, dry cough; clear, white, yellow, green)     Clear  4. HEMOPTYSIS: "Are you coughing up any blood?" If so ask: "How much?" (flecks, streaks, tablespoons, etc.)     No  5. DIFFICULTY BREATHING: "Are you having difficulty breathing?" If Yes, ask: "How bad is it?" (e.g., mild, moderate, severe)    - MILD: No SOB at rest, mild SOB with walking, speaks normally in sentences, can lie down, no retractions, pulse < 100.    - MODERATE: SOB at rest, SOB with minimal exertion and prefers to sit, cannot lie down flat, speaks in phrases, mild retractions, audible wheezing, pulse 100-120.    - SEVERE: Very SOB at rest, speaks in single words, struggling to breathe, sitting hunched forward, retractions, pulse > 120      No  6. FEVER: "Do you have a fever?" If Yes, ask: "What is your temperature, how was it measured, and when did it start?"     No  7.  CARDIAC HISTORY: "Do you have any history of heart disease?" (e.g., heart attack, congestive heart failure)      TIA  8. LUNG HISTORY: "Do you have any history of lung disease?"  (e.g., pulmonary embolus, asthma, emphysema)     No  9. PE RISK FACTORS: "Do you have a history of blood clots?" (or: recent major surgery, recent prolonged travel, bedridden) Kidney Stone Removal  10. OTHER SYMPTOMS: "Do you have any other symptoms?" (e.g., runny nose, wheezing, chest pain)       Congestion,  Nausea  12. TRAVEL: "Have you traveled out of the country in the last month?" (e.g., travel history, exposures)       Sick Granddaughter, minimal exposure.  Protocols used: Cough - Acute Productive-A-AH

## 2023-05-24 NOTE — Progress Notes (Signed)
Subjective:  Patient ID: Joshua Alvarez, male    DOB: [birth year]  Age: 78 y.o. MRN: [id number]  CC:   Chief Complaint  Patient presents with   Cough    Deep chest congestion cough - some production going on couple of days taking mucinex, sinus drainage    Wheezing    Sob     HPI:  78 year old male presents with respiratory symptoms.  Started yesterday.  Reports postnasal drip and cough.  He states that his cough is productive.  He is wheezing as well and is experiencing shortness of breath.  States that his arms are slightly achy.  No fever.  No reported sick contacts.  No relieving factors.  Patient Active Problem List   Diagnosis Date Noted   Respiratory infection 05/24/2023   Atrial fibrillation (HCC) 08/17/2021   Prediabetes 07/18/2021   Basal cell carcinoma of skin of scalp and neck 03/22/2021   Statin intolerance 08/12/2020   Hyperlipidemia 11/19/2012   Essential hypertension, benign 11/19/2012   BPH (benign prostatic hyperplasia) 11/19/2012    Social Hx   Social History   Socioeconomic History   Marital status: Married    Spouse name: Ann   Number of children: 2   Years of education: Not on file   Highest education level: Associate degree: occupational, Scientist, product/process development, or vocational program  Occupational History   Not on file  Tobacco Use   Smoking status: Former    Current packs/day: 0.00    Average packs/day: 0.3 packs/day for 10.0 years (2.5 ttl pk-yrs)    Types: Cigarettes    Start date: 04/1980    Quit date: 04/2010    Years since quitting: 13.1   Smokeless tobacco: Never  Vaping Use   Vaping status: Never Used  Substance and Sexual Activity   Alcohol use: Not Currently   Drug use: Never   Sexual activity: Not Currently    Birth control/protection: Abstinence  Other Topics Concern   Not on file  Social History Narrative   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   1 grandson and 1 granddaughter   2 step grandsons    Social Drivers of Corporate investment banker Strain: Low Risk  (04/17/2023)   Overall Financial Resource Strain (CARDIA)    Difficulty of Paying Living Expenses: Not hard at all  Food Insecurity: No Food Insecurity (04/17/2023)   Hunger Vital Sign    Worried About Running Out of Food in the Last Year: Never true    Ran Out of Food in the Last Year: Never true  Transportation Needs: No Transportation Needs (04/17/2023)   PRAPARE - Administrator, Civil Service (Medical): No    Lack of Transportation (Non-Medical): No  Physical Activity: Sufficiently Active (04/17/2023)   Exercise Vital Sign    Days of Exercise per Week: 3 days    Minutes of Exercise per Session: 90 min  Recent Concern: Physical Activity - Inactive (04/06/2023)   Exercise Vital Sign    Days of Exercise per Week: 0 days    Minutes of Exercise per Session: 0 min  Stress: Stress Concern Present (04/17/2023)   [employer] of Occupational Health - Occupational Stress Questionnaire    Feeling of Stress : To some extent  Social Connections: Socially Integrated (04/17/2023)   Social Connection and Isolation Panel [NHANES]    Frequency of Communication with Friends and Family: More than three times a week    Frequency of  Social Gatherings with Friends and Family: Three times a week    Attends Religious Services: More than 4 times per year    Active Member of Clubs or Organizations: Yes    Attends Engineer, structural: More than 4 times per year    Marital Status: Married    Review of Systems Per HPI  Objective:  BP (!) 146/92   Pulse 85   Temp 98.8 F (37.1 C)   Ht 5\' 7"  (1.702 m)   Wt 179 lb (81.2 kg)   SpO2 96%   BMI 28.04 kg/m      05/24/2023    4:12 PM 04/18/2023    8:47 AM 04/18/2023    8:31 AM  BP/Weight  Systolic BP 146 151 139  Diastolic BP 92 104 101  Wt. (Lbs) 179  177.8  BMI 28.04 kg/m2  27.85 kg/m2    Physical Exam Vitals and nursing note reviewed.  Constitutional:       General: He is not in acute distress. HENT:     Head: Normocephalic and atraumatic.     Mouth/Throat:     Pharynx: Oropharynx is clear.  Pulmonary:     Effort: Pulmonary effort is normal.     Breath sounds: Wheezing present.  Neurological:     Mental Status: He is alert.     Lab Results  Component Value Date   WBC 7.9 03/19/2023   HGB 15.5 03/19/2023   HCT 48.5 03/19/2023   PLT 245 03/19/2023   GLUCOSE 112 (H) 03/19/2023   CHOL 257 (H) 04/18/2023   TRIG 246 (H) 04/18/2023   HDL 50 04/18/2023   LDLCALC 161 (H) 04/18/2023   ALT 18 12/19/2021   AST 23 12/19/2021   NA 137 03/19/2023   K 4.4 03/19/2023   CL 105 03/19/2023   CREATININE 0.97 03/19/2023   BUN 11 03/19/2023   CO2 25 03/19/2023   PSA 3.9 07/27/2014   HGBA1C 5.9 (H) 05/30/2021     Assessment & Plan:   Problem List Items Addressed This Visit       Respiratory   Respiratory infection   Concern for influenza.  Awaiting test results.  Placing empirically on Tamiflu to cover while awaiting the results.  There is a lot of influenza in our community.  Patient wheezing diffusely on exam.  Placing on corticosteroids.  Promethazine DM for cough.      Relevant Medications   oseltamivir (TAMIFLU) 75 MG capsule   Other Visit Diagnoses       Cough, unspecified type    -  Primary   Relevant Orders   COVID-19, Flu A+B and RSV       Meds ordered this encounter  Medications   predniSONE (DELTASONE) 50 MG tablet    Sig: Take 1 tablet (50 mg total) by mouth daily for 5 days.    Dispense:  5 tablet    Refill:  0   promethazine-dextromethorphan (PROMETHAZINE-DM) 6.25-15 MG/5ML syrup    Sig: Take 5 mLs by mouth 4 (four) times daily as needed for cough.    Dispense:  118 mL    Refill:  0   oseltamivir (TAMIFLU) 75 MG capsule    Sig: Take 1 capsule (75 mg total) by mouth 2 (two) times daily.    Dispense:  10 capsule    Refill:  0    Follow-up:  Return if symptoms worsen or fail to improve.  Everlene Other  DO [facility]

## 2023-05-26 LAB — COVID-19, FLU A+B AND RSV
Influenza A, NAA: DETECTED — AB
Influenza B, NAA: NOT DETECTED
RSV, NAA: NOT DETECTED
SARS-CoV-2, NAA: NOT DETECTED

## 2023-05-27 ENCOUNTER — Encounter: Payer: Self-pay | Admitting: Family Medicine

## 2023-05-31 ENCOUNTER — Other Ambulatory Visit: Payer: Self-pay | Admitting: Family Medicine

## 2023-06-04 ENCOUNTER — Telehealth: Payer: Self-pay | Admitting: Family Medicine

## 2023-06-04 NOTE — Telephone Encounter (Signed)
 Copied from CRM [id number]. Topic: Clinical - Medication Question >> Jun 04, 2023 12:04 PM Fuller Mandril wrote: Reason for CRM: Patient called. States Dr Adriana Simas prescribed Evolocumab (REPATHA SURECLICK) 140 MG/ML SOAJ. He hasn't picked it up or taken it yet. Would like to know if he should have another blood test prior to 1st dose. Also states may need to be shown how to properly inject. Has only looked at videos on internet. Thank You

## 2023-06-05 ENCOUNTER — Other Ambulatory Visit: Payer: Self-pay | Admitting: Family Medicine

## 2023-06-05 MED ORDER — REPATHA SURECLICK 140 MG/ML ~~LOC~~ SOAJ: 140.0000 mg | SUBCUTANEOUS | 2 refills | Status: DC

## 2023-06-06 DIAGNOSIS — H3561 Retinal hemorrhage, right eye: Secondary | ICD-10-CM | POA: Diagnosis not present

## 2023-06-06 NOTE — Telephone Encounter (Signed)
 Tommie Sams, DO     Sent in again. Send PA to [place] please.

## 2023-06-08 ENCOUNTER — Encounter: Payer: Self-pay | Admitting: Family Medicine

## 2023-06-11 NOTE — Telephone Encounter (Signed)
Message sent to clinical pharmacist

## 2023-06-12 ENCOUNTER — Telehealth: Payer: Self-pay

## 2023-06-12 ENCOUNTER — Other Ambulatory Visit: Payer: Self-pay | Admitting: Family Medicine

## 2023-06-12 MED ORDER — ROSUVASTATIN CALCIUM 10 MG PO TABS: 10.0000 mg | ORAL_TABLET | Freq: Every day | ORAL | 3 refills | Status: DC

## 2023-06-12 NOTE — Telephone Encounter (Signed)
 Joshua Alvarez come by the office and the medication that Dr Adriana Simas put him on   Evolocumab (REPATHA SURECLICK) 140 MG/ML [employer] does not cover out of pocket is going to cost 687.00   Pt is wanting to see if he can be put on Crestor or rosuvastatin he said that his insurance will cover this medication.   Pt uses [facility] phone number is [phone]  Please call Dorene Sorrow to let him know what medication that you can put him on.

## 2023-06-12 NOTE — Telephone Encounter (Signed)
 See my chart message from patient and clinical pharmacist

## 2023-08-15 DIAGNOSIS — L578 Other skin changes due to chronic exposure to nonionizing radiation: Secondary | ICD-10-CM | POA: Diagnosis not present

## 2023-08-15 DIAGNOSIS — Z85828 Personal history of other malignant neoplasm of skin: Secondary | ICD-10-CM | POA: Diagnosis not present

## 2023-08-15 DIAGNOSIS — L814 Other melanin hyperpigmentation: Secondary | ICD-10-CM | POA: Diagnosis not present

## 2023-08-15 DIAGNOSIS — D2372 Other benign neoplasm of skin of left lower limb, including hip: Secondary | ICD-10-CM | POA: Diagnosis not present

## 2023-08-15 DIAGNOSIS — L570 Actinic keratosis: Secondary | ICD-10-CM | POA: Diagnosis not present

## 2023-08-15 DIAGNOSIS — D2271 Melanocytic nevi of right lower limb, including hip: Secondary | ICD-10-CM | POA: Diagnosis not present

## 2023-08-15 DIAGNOSIS — D2272 Melanocytic nevi of left lower limb, including hip: Secondary | ICD-10-CM | POA: Diagnosis not present

## 2023-08-15 DIAGNOSIS — L821 Other seborrheic keratosis: Secondary | ICD-10-CM | POA: Diagnosis not present

## 2023-08-15 DIAGNOSIS — D225 Melanocytic nevi of trunk: Secondary | ICD-10-CM | POA: Diagnosis not present

## 2023-08-21 ENCOUNTER — Other Ambulatory Visit: Payer: Self-pay | Admitting: Family Medicine

## 2023-08-21 ENCOUNTER — Telehealth: Payer: Self-pay

## 2023-08-21 DIAGNOSIS — E782 Mixed hyperlipidemia: Secondary | ICD-10-CM

## 2023-08-21 NOTE — Telephone Encounter (Signed)
 Communication  Reason for CRM: pt called in to see if Dr.cook can approve to get a Cholesterol lab blood work done . If Dr.Cook or nurse can give him a call if possible if can be approved .

## 2023-08-22 ENCOUNTER — Encounter: Payer: Self-pay | Admitting: Family Medicine

## 2023-08-23 DIAGNOSIS — E782 Mixed hyperlipidemia: Secondary | ICD-10-CM | POA: Diagnosis not present

## 2023-08-24 LAB — LIPID PANEL
Chol/HDL Ratio: 3.1 ratio (ref 0.0–5.0)
Cholesterol, Total: 148 mg/dL (ref 100–199)
HDL: 48 mg/dL (ref >39)
LDL Chol Calc (NIH): 77 mg/dL (ref 0–99)
Triglycerides: 130 mg/dL (ref 0–149)
VLDL Cholesterol Cal: 23 mg/dL (ref 5–40)

## 2023-08-26 ENCOUNTER — Ambulatory Visit: Payer: Self-pay | Admitting: Family Medicine

## 2023-08-27 ENCOUNTER — Other Ambulatory Visit: Payer: Self-pay | Admitting: Cardiology

## 2023-09-24 ENCOUNTER — Encounter: Payer: Self-pay | Admitting: Cardiology

## 2023-09-24 ENCOUNTER — Ambulatory Visit: Payer: Medicare HMO | Attending: Cardiology | Admitting: Cardiology

## 2023-09-24 VITALS — BP 130/88 | HR 72 | Ht 67.0 in | Wt 180.8 lb

## 2023-09-24 DIAGNOSIS — E782 Mixed hyperlipidemia: Secondary | ICD-10-CM

## 2023-09-24 DIAGNOSIS — I480 Paroxysmal atrial fibrillation: Secondary | ICD-10-CM

## 2023-09-24 DIAGNOSIS — I10 Essential (primary) hypertension: Secondary | ICD-10-CM | POA: Diagnosis not present

## 2023-09-24 DIAGNOSIS — I4819 Other persistent atrial fibrillation: Secondary | ICD-10-CM | POA: Diagnosis not present

## 2023-09-24 MED ORDER — SILDENAFIL CITRATE 50 MG PO TABS: ORAL_TABLET | ORAL | 0 refills | Status: AC

## 2023-09-24 NOTE — Patient Instructions (Signed)
 Medication Instructions:  Take Viagra  as directed  All other medications stay the same  Labwork: None today  Testing/Procedures: None today  Follow-Up: 6 months  Any Other Special Instructions Will Be Listed Below (If Applicable).  If you need a refill on your cardiac medications before your next appointment, please call your pharmacy.

## 2023-09-24 NOTE — Progress Notes (Signed)
    Cardiology Office Note  Date: 09/24/2023   ID: TASEAN MANCHA, DOB 1946-02-06, MRN [id number]  History of Present Illness: Joshua Alvarez is a 78 y.o. male last seen in December 2024.  He is here for a routine visit.  Reports no palpitations or exertional chest pain, does have to pace himself when doing physical labor out in his garden.  No dizziness or syncope.  I reviewed his ECG today which shows rate controlled atrial fibrillation.  We went over his medications.  He does not report any spontaneous bleeding problems on Eliquis .  I reviewed his most recent lab work.  Physical Exam: VS:  BP 130/88   Pulse 72   Ht 5' 7 (1.702 m)   Wt 180 lb 12.8 oz (82 kg)   SpO2 98%   BMI 28.32 kg/m , BMI Body mass index is 28.32 kg/m.  Wt Readings from Last 3 Encounters:  09/24/23 180 lb 12.8 oz (82 kg)  05/24/23 179 lb (81.2 kg)  04/18/23 177 lb 12.8 oz (80.6 kg)    General: Patient appears comfortable at rest. HEENT: Conjunctiva and lids normal. Neck: Supple, no elevated JVP or carotid bruits. Lungs: Clear to auscultation, nonlabored breathing at rest. Cardiac: Irregularly irregular without gallop. Extremities: No pitting edema.  ECG:  An ECG dated 09/13/2022 was personally reviewed today and demonstrated:  Atrial fibrillation.  Labwork: 03/19/2023: BUN 11; Creatinine, Ser 0.97; Hemoglobin 15.5; Platelets 245; Potassium 4.4; Sodium 137     Component Value Date/Time   CHOL 148 08/23/2023 0829   TRIG 130 08/23/2023 0829   HDL 48 08/23/2023 0829   CHOLHDL 3.1 08/23/2023 0829   CHOLHDL 3.7 12/23/2013 0809   VLDL 25 12/23/2013 0809   LDLCALC 77 08/23/2023 0829   Other Studies Reviewed Today:  No interval cardiac testing for review today.  Assessment and Plan:  1.  Persistent atrial fibrillation with CHA2DS2-VASc score of 4.  Heart rate controlled and asymptomatic.  Continue atenolol  25 mg daily and Eliquis  5 mg twice daily.   2.  Primary hypertension.  No change in current  regimen which also includes Norvasc  5 mg daily.   3.  Mixed hyperlipidemia.  LDL 77 in May.  He is on Crestor  10 mg daily with continued follow-up with Dr. Debrah Fan.  Does have history of statin myalgias, but reportedly tolerating current dosing.  Disposition:  Follow up 6 months.  Signed, Gerard Knight, M.D., F.A.C.C. [facility]

## 2023-10-16 ENCOUNTER — Ambulatory Visit: Payer: Medicare HMO | Admitting: Family Medicine

## 2023-10-22 ENCOUNTER — Other Ambulatory Visit: Payer: Self-pay | Admitting: Cardiology

## 2023-10-22 ENCOUNTER — Encounter: Payer: Self-pay | Admitting: Family Medicine

## 2023-10-22 ENCOUNTER — Ambulatory Visit (INDEPENDENT_AMBULATORY_CARE_PROVIDER_SITE_OTHER): Admitting: Family Medicine

## 2023-10-22 ENCOUNTER — Other Ambulatory Visit (HOSPITAL_COMMUNITY): Payer: Self-pay

## 2023-10-22 ENCOUNTER — Telehealth: Payer: Self-pay | Admitting: Pharmacy Technician

## 2023-10-22 VITALS — BP 132/82 | HR 80 | Temp 97.0°F | Ht 67.0 in | Wt 178.0 lb

## 2023-10-22 DIAGNOSIS — I10 Essential (primary) hypertension: Secondary | ICD-10-CM | POA: Diagnosis not present

## 2023-10-22 DIAGNOSIS — I4891 Unspecified atrial fibrillation: Secondary | ICD-10-CM

## 2023-10-22 DIAGNOSIS — E782 Mixed hyperlipidemia: Secondary | ICD-10-CM

## 2023-10-22 DIAGNOSIS — I4819 Other persistent atrial fibrillation: Secondary | ICD-10-CM

## 2023-10-22 MED ORDER — BEMPEDOIC ACID 180 MG PO TABS: 180.0000 mg | ORAL_TABLET | Freq: Every day | ORAL | 1 refills | Status: DC

## 2023-10-22 NOTE — Assessment & Plan Note (Signed)
 Has failed statin therapy.  Repatha  cost prohibitive.  Trial of Nexletol .

## 2023-10-22 NOTE — Progress Notes (Signed)
 Subjective:  Patient ID: Joshua Alvarez, male    DOB: Mar 03, 1946  Age: 78 y.o. MRN: [id number]  CC:   Chief Complaint  Patient presents with   Hyperlipidemia    Discuss cholesterol medication - causes joint and muscle pain     HPI:  78 year old male with the below mentioned medical problems presents for follow-up.  Patient was tolerating Crestor  but is no longer tolerating due to joint pain and muscle pain.  Patient has now tried several statins.  Was on Repatha  briefly but was cost prohibitive.  He would like to discuss other treatment options regarding his lipids.  Patient follows closely with cardiology.  A-fib stable on atenolol  and apixaban .  Hypertension stable on amlodipine  and atenolol .  Patient reports good appetite.  No chest pain or shortness of breath.  He is overall doing well.  Patient Active Problem List   Diagnosis Date Noted   Atrial fibrillation (HCC) 08/17/2021   Prediabetes 07/18/2021   Basal cell carcinoma of skin of scalp and neck 03/22/2021   Statin intolerance 08/12/2020   Hyperlipidemia 11/19/2012   Essential hypertension, benign 11/19/2012   BPH (benign prostatic hyperplasia) 11/19/2012    Social Hx   Social History   Socioeconomic History   Marital status: Married    Spouse name: Ann   Number of children: 2   Years of education: Not on file   Highest education level: Associate degree: occupational, Scientist, product/process development, or vocational program  Occupational History   Not on file  Tobacco Use   Smoking status: Former    Current packs/day: 0.00    Average packs/day: 0.3 packs/day for 10.0 years (2.5 ttl pk-yrs)    Types: Cigarettes    Start date: 04/1980    Quit date: 04/2010    Years since quitting: 13.5   Smokeless tobacco: Never  Vaping Use   Vaping status: Never Used  Substance and Sexual Activity   Alcohol use: Not Currently   Drug use: Never   Sexual activity: Not Currently    Birth control/protection: Abstinence  Other Topics Concern    Not on file  Social History Narrative   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   1 grandson and 1 granddaughter   2 step grandsons   Social Drivers of Corporate investment banker Strain: Low Risk  (10/18/2023)   Overall Financial Resource Strain (CARDIA)    Difficulty of Paying Living Expenses: Not hard at all  Food Insecurity: No Food Insecurity (10/18/2023)   Hunger Vital Sign    Worried About Running Out of Food in the Last Year: Never true    Ran Out of Food in the Last Year: Never true  Transportation Needs: No Transportation Needs (10/18/2023)   PRAPARE - Administrator, Civil Service (Medical): No    Lack of Transportation (Non-Medical): No  Physical Activity: Sufficiently Active (10/18/2023)   Exercise Vital Sign    Days of Exercise per Week: 4 days    Minutes of Exercise per Session: 60 min  Stress: No Stress Concern Present (10/18/2023)   [employer] of Occupational Health - Occupational Stress Questionnaire    Feeling of Stress: Only a little  Social Connections: Socially Integrated (10/18/2023)   Social Connection and Isolation Panel    Frequency of Communication with Friends and Family: More than three times a week    Frequency of Social Gatherings with Friends and Family: Twice a week    Attends Religious  Services: More than 4 times per year    Active Member of Clubs or Organizations: Yes    Attends Banker Meetings: More than 4 times per year    Marital Status: Married    Review of Systems Per HPI  Objective:  BP 132/82   Pulse 80   Temp (!) 97 F (36.1 C)   Ht 5' 7 (1.702 m)   Wt 178 lb (80.7 kg)   SpO2 99%   BMI 27.88 kg/m      10/22/2023    9:04 AM 09/24/2023    8:09 AM 05/24/2023    4:12 PM  BP/Weight  Systolic BP 132 130 146  Diastolic BP 82 88 92  Wt. (Lbs) 178 180.8 179  BMI 27.88 kg/m2 28.32 kg/m2 28.04 kg/m2    Physical Exam Vitals and nursing note reviewed.  Constitutional:       General: He is not in acute distress. HENT:     Head: Normocephalic and atraumatic.  Eyes:     General:        Right eye: No discharge.        Left eye: No discharge.     Conjunctiva/sclera: Conjunctivae normal.  Cardiovascular:     Rate and Rhythm: Normal rate.  Pulmonary:     Effort: Pulmonary effort is normal. No respiratory distress.  Neurological:     Mental Status: He is alert.  Psychiatric:        Mood and Affect: Mood normal.        Behavior: Behavior normal.     Lab Results  Component Value Date   WBC 7.9 03/19/2023   HGB 15.5 03/19/2023   HCT 48.5 03/19/2023   PLT 245 03/19/2023   GLUCOSE 112 (H) 03/19/2023   CHOL 148 08/23/2023   TRIG 130 08/23/2023   HDL 48 08/23/2023   LDLCALC 77 08/23/2023   ALT 18 12/19/2021   AST 23 12/19/2021   NA 137 03/19/2023   K 4.4 03/19/2023   CL 105 03/19/2023   CREATININE 0.97 03/19/2023   BUN 11 03/19/2023   CO2 25 03/19/2023   PSA 3.9 07/27/2014   HGBA1C 5.9 (H) 05/30/2021     Assessment & Plan:  Mixed hyperlipidemia Assessment & Plan: Has failed statin therapy.  Repatha  cost prohibitive.  Trial of Nexletol .   Essential hypertension, benign Assessment & Plan: Stable on atenolol  amlodipine .  Continue.  Orders: -     CMP14+EGFR  Atrial fibrillation, unspecified type (HCC) Assessment & Plan: Stable.  Continue atenolol  and apixaban .  Orders: -     Bempedoic Acid ; Take 1 tablet (180 mg total) by mouth daily at 6 (six) AM.  Dispense: 90 tablet; Refill: 1 -     CBC    Follow-up: 6 months  Seng Larch Bluford DO [facility]

## 2023-10-22 NOTE — Assessment & Plan Note (Signed)
 Stable.  Continue atenolol  and apixaban .

## 2023-10-22 NOTE — Assessment & Plan Note (Signed)
 Stable on atenolol  amlodipine .  Continue.

## 2023-10-22 NOTE — Telephone Encounter (Signed)
 Pharmacy Patient Advocate Encounter   Received notification from CoverMyMeds that prior authorization for Nexletol  180MG  tablets is required/requested.   Insurance verification completed.   The patient is insured through [facility] ADVANTAGE/RX ADVANCE .   Per test claim: PA required; PA submitted to above mentioned insurance via LATENT Key/confirmation #/EOC [id number] Status is pending

## 2023-10-22 NOTE — Patient Instructions (Signed)
Labs today.  Medication sent.  Follow up in 6 months.  Take care  Dr. Adriana Simas

## 2023-10-22 NOTE — Telephone Encounter (Signed)
 Prescription refill request for Eliquis  received. Indication:afib Last office visit:6/25 Scr:0.97  12/24 Age: 78 Weight:80.7  kg  Prescription refilled

## 2023-10-23 ENCOUNTER — Ambulatory Visit: Payer: Self-pay | Admitting: Family Medicine

## 2023-10-23 ENCOUNTER — Other Ambulatory Visit: Payer: Self-pay

## 2023-10-23 ENCOUNTER — Other Ambulatory Visit (HOSPITAL_COMMUNITY): Payer: Self-pay

## 2023-10-23 DIAGNOSIS — E875 Hyperkalemia: Secondary | ICD-10-CM

## 2023-10-23 LAB — CBC
Hematocrit: 51.1 % — ABNORMAL HIGH (ref 37.5–51.0)
Hemoglobin: 16.2 g/dL (ref 13.0–17.7)
MCH: 27.4 pg (ref 26.6–33.0)
MCHC: 31.7 g/dL (ref 31.5–35.7)
MCV: 86 fL (ref 79–97)
Platelets: 231 x10E3/uL (ref 150–450)
RBC: 5.92 x10E6/uL — ABNORMAL HIGH (ref 4.14–5.80)
RDW: 13 % (ref 11.6–15.4)
WBC: 8 x10E3/uL (ref 3.4–10.8)

## 2023-10-23 LAB — CMP14+EGFR
ALT: 23 IU/L (ref 0–44)
AST: 22 IU/L (ref 0–40)
Albumin: 4.4 g/dL (ref 3.8–4.8)
Alkaline Phosphatase: 132 IU/L — ABNORMAL HIGH (ref 44–121)
BUN/Creatinine Ratio: 9 — ABNORMAL LOW (ref 10–24)
BUN: 9 mg/dL (ref 8–27)
Bilirubin Total: 0.6 mg/dL (ref 0.0–1.2)
CO2: 21 mmol/L (ref 20–29)
Calcium: 10.1 mg/dL (ref 8.6–10.2)
Chloride: 103 mmol/L (ref 96–106)
Creatinine, Ser: 1.04 mg/dL (ref 0.76–1.27)
Globulin, Total: 3.1 g/dL (ref 1.5–4.5)
Glucose: 98 mg/dL (ref 70–99)
Potassium: 5.7 mmol/L — ABNORMAL HIGH (ref 3.5–5.2)
Sodium: 142 mmol/L (ref 134–144)
Total Protein: 7.5 g/dL (ref 6.0–8.5)
eGFR: 74 mL/min/1.73 (ref >59)

## 2023-10-23 NOTE — Telephone Encounter (Signed)
 Pharmacy Patient Advocate Encounter  Received notification from [facility] ADVANTAGE/RX ADVANCE that Prior Authorization for Nexletol  180MG  tablets  has been APPROVED from 10/22/2023 to 04/19/2024. Ran test claim, Copay is $161.21. This test claim was processed through [facility]- copay amounts may vary at other pharmacies due to pharmacy/plan contracts, or as the patient moves through the different stages of their insurance plan.   PA #/Case ID/Reference #: [id number]

## 2023-10-31 ENCOUNTER — Encounter: Payer: Self-pay | Admitting: Family Medicine

## 2023-10-31 DIAGNOSIS — E875 Hyperkalemia: Secondary | ICD-10-CM | POA: Diagnosis not present

## 2023-11-01 LAB — BASIC METABOLIC PANEL WITH GFR
BUN/Creatinine Ratio: 12 (ref 10–24)
BUN: 13 mg/dL (ref 8–27)
CO2: 22 mmol/L (ref 20–29)
Calcium: 9.7 mg/dL (ref 8.6–10.2)
Chloride: 103 mmol/L (ref 96–106)
Creatinine, Ser: 1.13 mg/dL (ref 0.76–1.27)
Glucose: 98 mg/dL (ref 70–99)
Potassium: 5.2 mmol/L (ref 3.5–5.2)
Sodium: 143 mmol/L (ref 134–144)
eGFR: 67 mL/min/1.73 (ref >59)

## 2023-11-06 IMAGING — DX DG CHEST 2V
2 series · 2 of 2 positions shown · non-contrast
Comparison: None

CLINICAL DATA: Cough, new onset atrial fibrillation, shortness of
breath

EXAM:
CHEST - 2 VIEW

[chest pa]
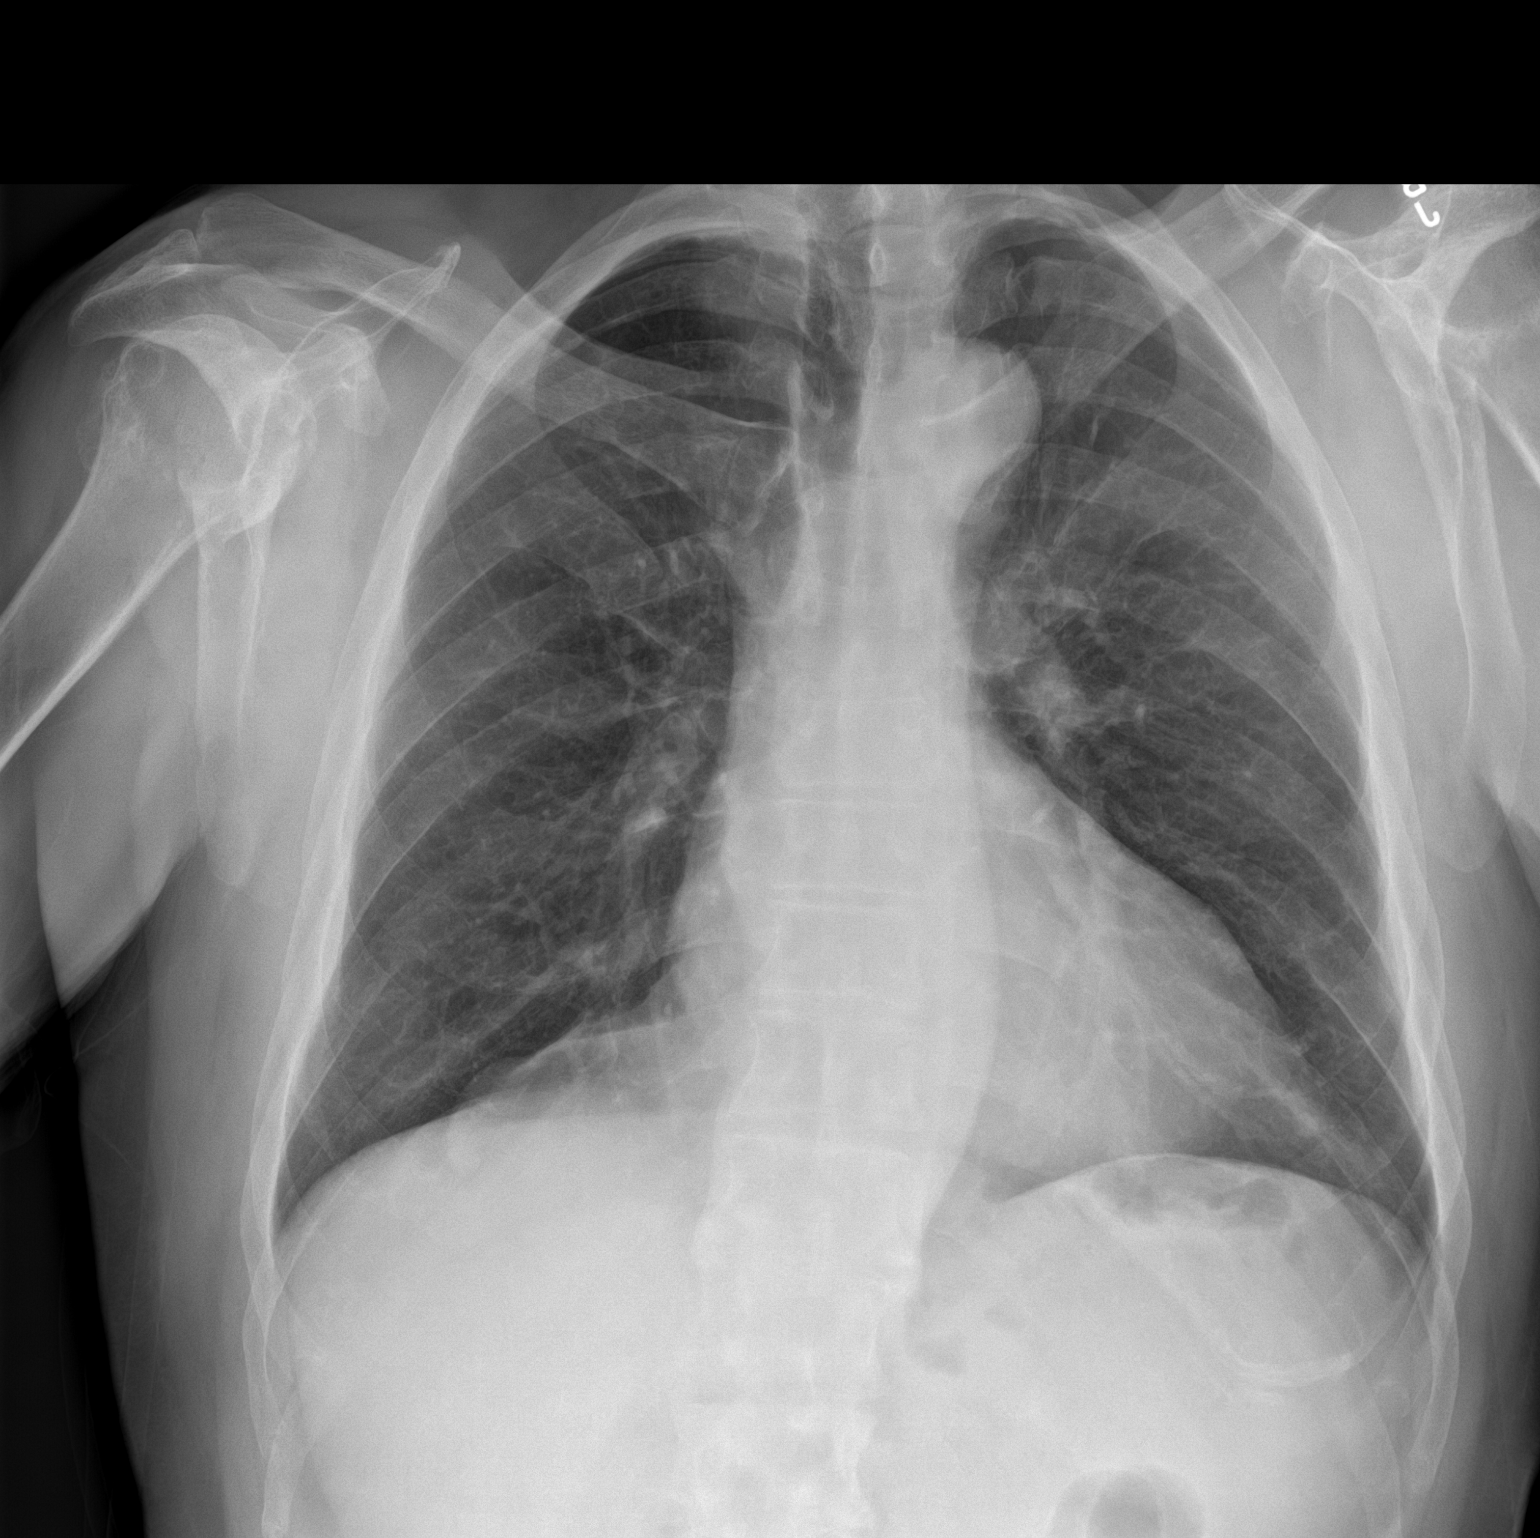

[chest lat]
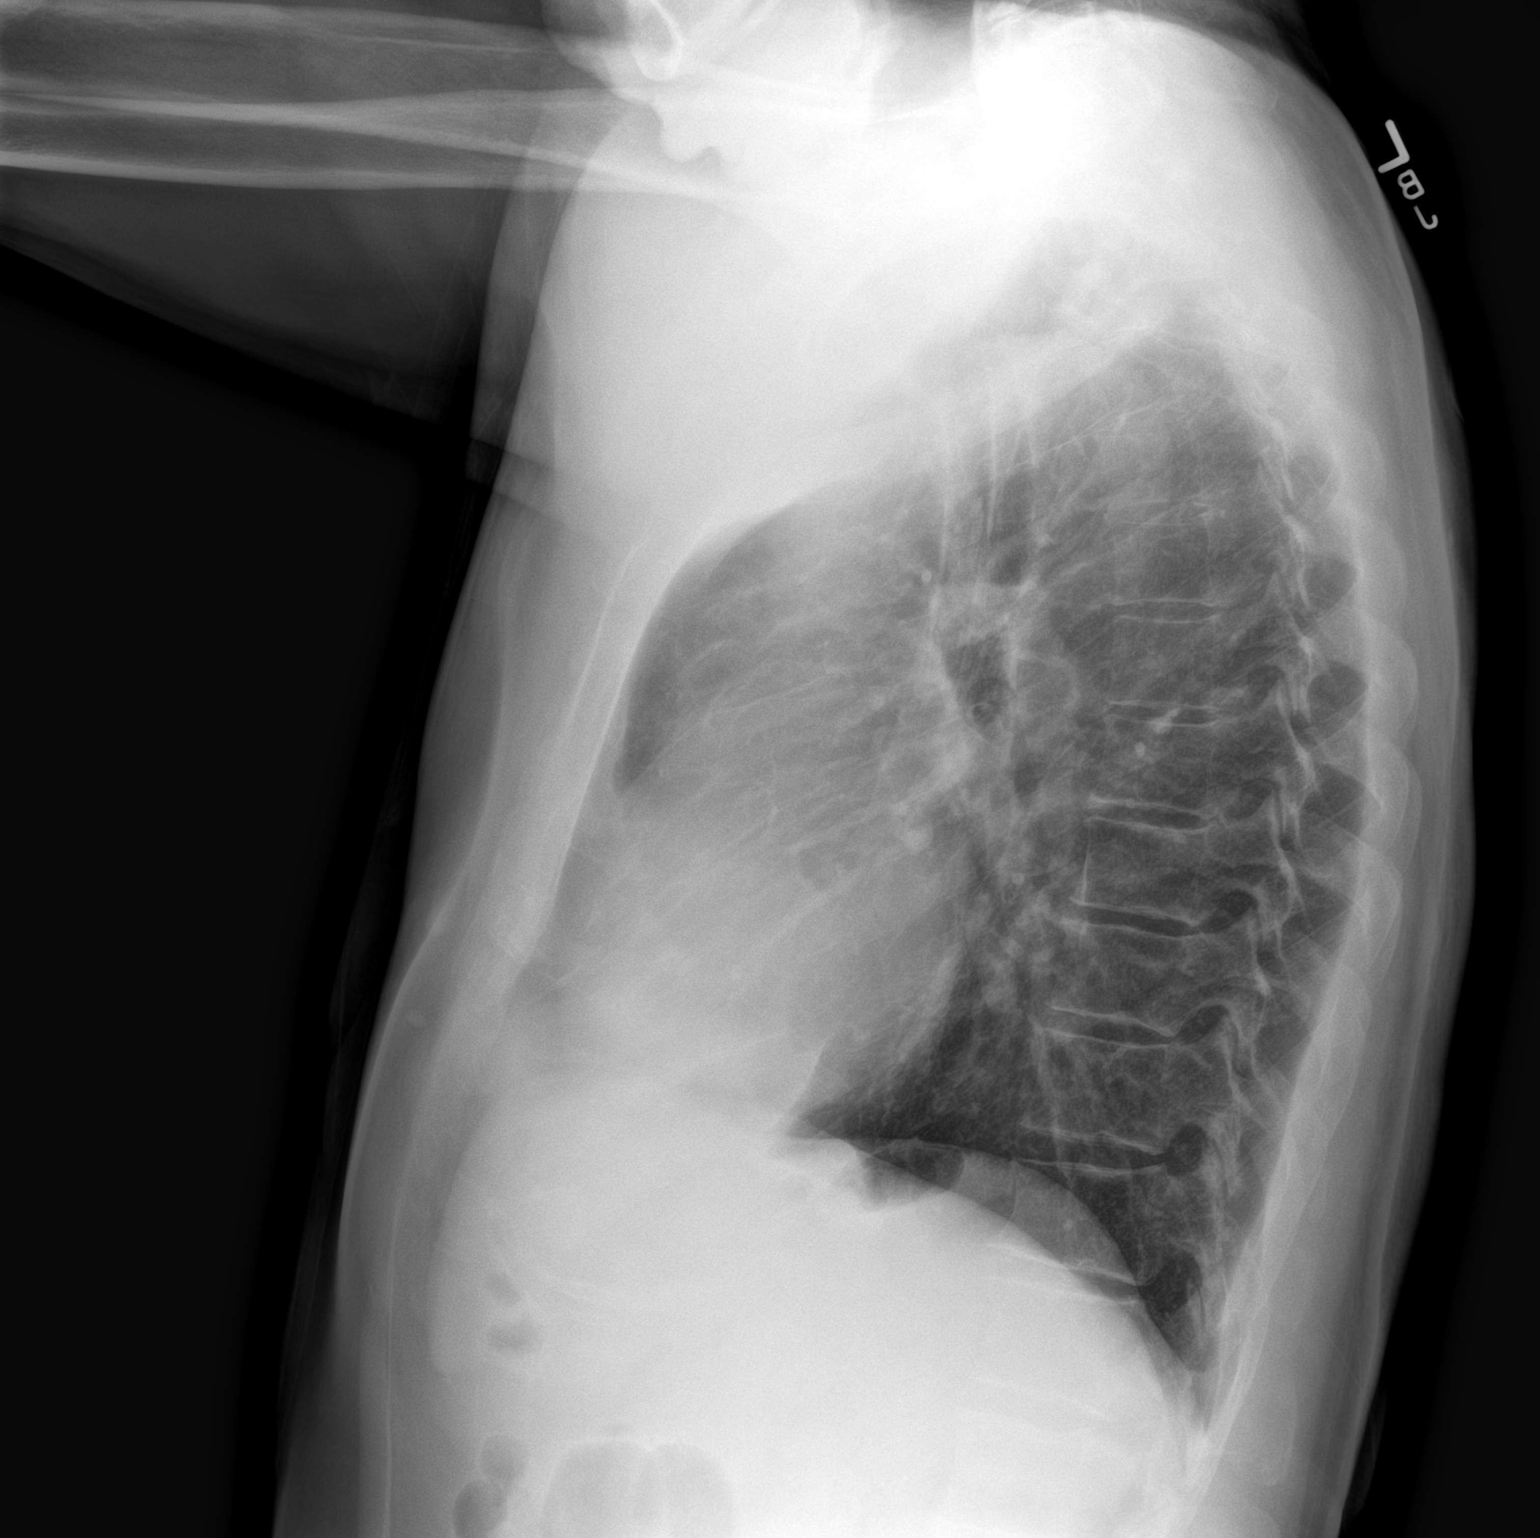

[2 of 2 positions shown; findings below may reference images not displayed]

FINDINGS: Upper normal heart size.

Mediastinal contours and pulmonary vascularity normal.

Lungs clear.

No pulmonary infiltrate, pleural effusion, or pneumothorax.

Osseous structures unremarkable.
IMPRESSION: No acute abnormalities.

## 2023-12-23 ENCOUNTER — Encounter: Payer: Self-pay | Admitting: Family Medicine

## 2024-01-07 DIAGNOSIS — H1820 Unspecified corneal edema: Secondary | ICD-10-CM | POA: Diagnosis not present

## 2024-01-09 DIAGNOSIS — H1820 Unspecified corneal edema: Secondary | ICD-10-CM | POA: Diagnosis not present

## 2024-02-12 DIAGNOSIS — R399 Unspecified symptoms and signs involving the genitourinary system: Secondary | ICD-10-CM | POA: Diagnosis not present

## 2024-02-12 DIAGNOSIS — R351 Nocturia: Secondary | ICD-10-CM | POA: Diagnosis not present

## 2024-02-12 DIAGNOSIS — R3912 Poor urinary stream: Secondary | ICD-10-CM | POA: Diagnosis not present

## 2024-02-27 DIAGNOSIS — D3132 Benign neoplasm of left choroid: Secondary | ICD-10-CM | POA: Diagnosis not present

## 2024-02-28 DIAGNOSIS — L570 Actinic keratosis: Secondary | ICD-10-CM | POA: Diagnosis not present

## 2024-02-28 DIAGNOSIS — D225 Melanocytic nevi of trunk: Secondary | ICD-10-CM | POA: Diagnosis not present

## 2024-02-28 DIAGNOSIS — D485 Neoplasm of uncertain behavior of skin: Secondary | ICD-10-CM | POA: Diagnosis not present

## 2024-02-28 DIAGNOSIS — L814 Other melanin hyperpigmentation: Secondary | ICD-10-CM | POA: Diagnosis not present

## 2024-02-28 DIAGNOSIS — Z85828 Personal history of other malignant neoplasm of skin: Secondary | ICD-10-CM | POA: Diagnosis not present

## 2024-02-28 DIAGNOSIS — L820 Inflamed seborrheic keratosis: Secondary | ICD-10-CM | POA: Diagnosis not present

## 2024-02-28 DIAGNOSIS — D2271 Melanocytic nevi of right lower limb, including hip: Secondary | ICD-10-CM | POA: Diagnosis not present

## 2024-02-28 DIAGNOSIS — D2272 Melanocytic nevi of left lower limb, including hip: Secondary | ICD-10-CM | POA: Diagnosis not present

## 2024-02-28 DIAGNOSIS — D2372 Other benign neoplasm of skin of left lower limb, including hip: Secondary | ICD-10-CM | POA: Diagnosis not present

## 2024-02-28 DIAGNOSIS — L578 Other skin changes due to chronic exposure to nonionizing radiation: Secondary | ICD-10-CM | POA: Diagnosis not present

## 2024-02-28 DIAGNOSIS — L821 Other seborrheic keratosis: Secondary | ICD-10-CM | POA: Diagnosis not present

## 2024-04-11 ENCOUNTER — Ambulatory Visit: Payer: Medicare HMO

## 2024-04-11 VITALS — Ht 67.0 in | Wt 178.0 lb

## 2024-04-11 DIAGNOSIS — Z0000 Encounter for general adult medical examination without abnormal findings: Secondary | ICD-10-CM | POA: Diagnosis not present

## 2024-04-11 NOTE — Progress Notes (Signed)
 "  Chief Complaint  Patient presents with   Medicare Wellness     Subjective:   Joshua Alvarez is a 79 y.o. male who presents for a Medicare Annual Wellness Visit.  Visit info / Clinical Intake: Medicare Wellness Visit Type:: Subsequent Annual Wellness Visit Persons participating in visit and providing information:: patient Medicare Wellness Visit Mode:: Telephone If telephone:: video declined Since this visit was completed virtually, some vitals may be partially provided or unavailable. Missing vitals are due to the limitations of the virtual format.: Documented vitals are patient reported If Telephone or Video please confirm:: I connected with patient using audio/video enable telemedicine. I verified patient identity with two identifiers, discussed telehealth limitations, and patient agreed to proceed. Patient Location:: home Provider Location:: office Interpreter Needed?: No Pre-visit prep was completed: yes AWV questionnaire completed by patient prior to visit?: yes Date:: 04/09/24 Living arrangements:: (Patient-Rptd) lives with spouse/significant other Patient's Overall Health Status Rating: (Patient-Rptd) good Typical amount of pain: (Patient-Rptd) some Does pain affect daily life?: (Patient-Rptd) no Are you currently prescribed opioids?: no  Dietary Habits and Nutritional Risks How many meals a day?: (Patient-Rptd) 3 Eats fruit and vegetables daily?: (!) (Patient-Rptd) no Most meals are obtained by: (Patient-Rptd) preparing own meals; eating out In the last 2 weeks, have you had any of the following?: none Diabetic:: no  Functional Status Activities of Daily Living (to include ambulation/medication): (Patient-Rptd) Independent Ambulation: (Patient-Rptd) Independent Medication Administration: (Patient-Rptd) Independent Home Management (perform basic housework or laundry): (Patient-Rptd) Independent Manage your own finances?: (Patient-Rptd) yes Primary transportation  is: (Patient-Rptd) driving Concerns about vision?: no *vision screening is required for WTM* Concerns about hearing?: no  Fall Screening Falls in the past year?: (Patient-Rptd) 0 Number of falls in past year: 0 Was there an injury with Fall?: 0 Fall Risk Category Calculator: 0 Patient Fall Risk Level: Low Fall Risk  Fall Risk Patient at Risk for Falls Due to: No Fall Risks Fall risk Follow up: Falls evaluation completed; Education provided; Falls prevention discussed  Home and Transportation Safety: All rugs have non-skid backing?: (Patient-Rptd) yes All stairs or steps have railings?: (Patient-Rptd) yes Grab bars in the bathtub or shower?: (!) (Patient-Rptd) no Have non-skid surface in bathtub or shower?: (Patient-Rptd) yes Good home lighting?: (Patient-Rptd) yes Regular seat belt use?: (Patient-Rptd) yes Hospital stays in the last year:: (Patient-Rptd) no  Cognitive Assessment Difficulty concentrating, remembering, or making decisions? : (Patient-Rptd) no Will 6CIT or Mini Cog be Completed: no 6CIT or Mini Cog Declined: patient alert, oriented, able to answer questions appropriately and recall recent events  Advance Directives (For Healthcare) Does Patient Have a Medical Advance Directive?: No Would patient like information on creating a medical advance directive?: Yes (MAU/Ambulatory/Procedural Areas - Information given)  Reviewed/Updated  Reviewed/Updated: Reviewed All (Medical, Surgical, Family, Medications, Allergies, Care Teams, Patient Goals)    Allergies (verified) Statins, Clarithromycin , and Enalapril    Current Medications (verified) Outpatient Encounter Medications as of 04/11/2024  Medication Sig   amLODipine  (NORVASC ) 5 MG tablet Take 1 tablet (5 mg total) by mouth daily.   atenolol  (TENORMIN ) 25 MG tablet TAKE ONE TABLET (25MG  TOTAL) BY MOUTH DAILY   Bempedoic Acid  180 MG TABS Take 1 tablet (180 mg total) by mouth daily at 6 (six) AM.   ELIQUIS  5 MG TABS  tablet TAKE ONE TABLET BY MOUTH TWICE A DAY   sildenafil  (VIAGRA ) 50 MG tablet Take 30 minutes to 1 hour before sexual activity   No facility-administered encounter medications on file as of  04/11/2024.    History: Past Medical History:  Diagnosis Date   Allergic rhinitis    Allergy 2010   Food   Anemia    Arthritis    Bladder spasms    Cataract 10/24   Colon polyp    History of kidney stones    x 3    Hypertension 1980   Migraine headache    Paroxysmal atrial fibrillation [facility])    Diagnosed April 2023   Skin cancer    Urgency of urination    Past Surgical History:  Procedure Laterality Date   APPENDECTOMY     COLONOSCOPY     COLONOSCOPY N/A 02/11/2014   Procedure: COLONOSCOPY;  Surgeon: Claudis RAYMOND Rivet, MD;  Location: [facility];  Service: Endoscopy;  Laterality: N/A;  730   CYSTOSCOPY     x 2 - stone removal   FINGER SURGERY Right    right 5th finger   INGUINAL HERNIA REPAIR Bilateral    INSERTION OF MESH Left 05/04/2020   Procedure: INSERTION OF MESH;  Surgeon: Rubin Calamity, MD;  Location: [facility] OR;  Service: General;  Laterality: Left;   RIGHT KNEE REPAIRED     TRANSURETHRAL RESECTION OF PROSTATE N/A 08/26/2021   Procedure: TRANSURETHRAL RESECTION OF THE PROSTATE (TURP);  Surgeon: Cam Morene ORN, MD;  Location: [facility] ORS;  Service: Urology;  Laterality: N/A;   XI ROBOTIC ASSISTED INGUINAL HERNIA REPAIR WITH MESH Left 05/04/2020   Procedure: XI ROBOTIC ASSISTED LEFT INGUINAL HERNIA REPAIR WITH MESH;  Surgeon: Rubin Calamity, MD;  Location: [facility] OR;  Service: General;  Laterality: Left;   Family History  Problem Relation Age of Onset   Cancer Mother        Breast   Heart attack Father    Social History   Occupational History   Not on file  Tobacco Use   Smoking status: Former    Current packs/day: 0.00    Average packs/day: 0.3 packs/day for 11.7 years (3.0 ttl pk-yrs)    Types: Cigarettes    Start date: 04/1980    Quit date: 04/2010    Years since  quitting: 14.0   Smokeless tobacco: Never  Vaping Use   Vaping status: Never Used  Substance and Sexual Activity   Alcohol use: Not Currently   Drug use: Never   Sexual activity: Not Currently    Birth control/protection: Abstinence   Tobacco Counseling Counseling given: Not Answered  SDOH Screenings   Food Insecurity: No Food Insecurity (04/09/2024)  Housing: Low Risk (04/09/2024)  Transportation Needs: No Transportation Needs (04/09/2024)  Utilities: Not At Risk (04/11/2024)  Alcohol Screen: Low Risk (04/17/2023)  Depression (PHQ2-9): Low Risk (04/11/2024)  Financial Resource Strain: Low Risk (04/09/2024)  Physical Activity: Insufficiently Active (04/09/2024)  Social Connections: Socially Integrated (04/09/2024)  Stress: Stress Concern Present (04/09/2024)  Tobacco Use: Medium Risk (04/11/2024)  Health Literacy: Adequate Health Literacy (04/11/2024)   See flowsheets for full screening details  Depression Screen PHQ 2 & 9 Depression Scale- Over the past 2 weeks, how often have you been bothered by any of the following problems? Little interest or pleasure in doing things: 0 Feeling down, depressed, or hopeless (PHQ Adolescent also includes...irritable): 0 PHQ-2 Total Score: 0 Trouble falling or staying asleep, or sleeping too much: 2 Feeling tired or having little energy: 1 Poor appetite or overeating (PHQ Adolescent also includes...weight loss): 0 Feeling bad about yourself - or that you are a failure or have let yourself or your family down: 0 Trouble concentrating  on things, such as reading the newspaper or watching television [facility] Adolescent also includes...like school work): 0 Moving or speaking so slowly that other people could have noticed. Or the opposite - being so fidgety or restless that you have been moving around a lot more than usual: 0 Thoughts that you would be better off dead, or of hurting yourself in some way: 0 PHQ-9 Total Score: 3 If you checked off any  problems, how difficult have these problems made it for you to do your work, take care of things at home, or get along with other people?: Not difficult at all  Depression Treatment Depression Interventions/Treatment : EYV7-0 Score <4 Follow-up Not Indicated     Goals Addressed             This Visit's Progress    Exercise 3x per week (30 min per time)   On track    Pt would like to continue to exercise, work out in the yard, [employer].              Objective:    Today's Vitals   04/11/24 1307  Weight: 178 lb (80.7 kg)  Height: 5' 7 (1.702 m)   Body mass index is 27.88 kg/m.  Hearing/Vision screen Hearing Screening - Comments:: Patient is able to hear conversational tones without difficulty. No issues reported.   Vision Screening - Comments:: Up to date with routine eye exams with [facility]  Immunizations and Health Maintenance Health Maintenance  Topic Date Due   Zoster Vaccines- Shingrix (1 of 2) 08/01/1965   DTaP/Tdap/Td (2 - Tdap) 09/15/2019   Influenza Vaccine  07/08/2024 (Originally 11/09/2023)   Medicare Annual Wellness (AWV)  04/11/2025   Pneumococcal Vaccine: 50+ Years  Completed   Meningococcal B Vaccine  Aged Out   Colonoscopy  Discontinued   COVID-19 Vaccine  Discontinued   Hepatitis C Screening  Discontinued        Assessment/Plan:  This is a routine wellness examination for Elsie.  Patient Care Team: Cook, Jayce G, DO as PCP - General (Family Medicine) Debera Jayson MATSU, MD as PCP - Cardiology (Cardiology)  I have personally reviewed and noted the following in the patients chart:   Medical and social history Use of alcohol, tobacco or illicit drugs  Current medications and supplements including opioid prescriptions. Functional ability and status Nutritional status Physical activity Advanced directives List of other physicians Hospitalizations, surgeries, and ER visits in previous 12 months Vitals Screenings to include  cognitive, depression, and falls Referrals and appointments  No orders of the defined types were placed in this encounter.  In addition, I have reviewed and discussed with patient certain preventive protocols, quality metrics, and best practice recommendations. A written personalized care plan for preventive services as well as general preventive health recommendations were provided to patient.   Lavelle Charmaine Browner, LPN   11/15/7971   Return in 1 year (on 04/11/2025).  After Visit Summary: (MyChart) Due to this being a telephonic visit, the after visit summary with patients personalized plan was offered to patient via MyChart   Nurse Notes: No voiced or noted concerns at this time Patient advised to keep follow-up appointment with PCP (04/24/24)  "

## 2024-04-11 NOTE — Patient Instructions (Signed)
 Joshua Alvarez,  Thank you for taking the time for your Medicare Wellness Visit. I appreciate your continued commitment to your health goals. Please review the care plan we discussed, and feel free to reach out if I can assist you further.  Please note that Annual Wellness Visits do not include a physical exam. Some assessments may be limited, especially if the visit was conducted virtually. If needed, we may recommend an in-person follow-up with your provider.  Ongoing Care Seeing your primary care provider every 3 to 6 months helps us  monitor your health and provide consistent, personalized care.   Referrals If a referral was made during today's visit and you haven't received any updates within two weeks, please contact the referred provider directly to check on the status.  Recommended Screenings:  Health Maintenance  Topic Date Due   Zoster (Shingles) Vaccine (1 of 2) 08/01/1965   DTaP/Tdap/Td vaccine (2 - Tdap) 09/15/2019   Flu Shot  07/08/2024*   Medicare Annual Wellness Visit  04/11/2025   Pneumococcal Vaccine for age over 58  Completed   Meningitis B Vaccine  Aged Out   Colon Cancer Screening  Discontinued   COVID-19 Vaccine  Discontinued   Hepatitis C Screening  Discontinued  *Topic was postponed. The date shown is not the original due date.       04/09/2024   12:33 PM  Advanced Directives  Does Patient Have a Medical Advance Directive? No  Would patient like information on creating a medical advance directive? Yes (MAU/Ambulatory/Procedural Areas - Information given)   Information on Advanced Care Planning can be found at [place]  Secretary of [facility] Advance Health Care Directives Advance Health Care Directives ([email or web address]   Vision: Annual vision screenings are recommended for early detection of glaucoma, cataracts, and diabetic retinopathy. These exams can also reveal signs of chronic conditions such as diabetes and high blood pressure.  Dental: Annual dental  screenings help detect early signs of oral cancer, gum disease, and other conditions linked to overall health, including heart disease and diabetes.  Please see the attached documents for additional preventive care recommendations.

## 2024-04-23 ENCOUNTER — Other Ambulatory Visit: Payer: Self-pay | Admitting: Family Medicine

## 2024-04-24 ENCOUNTER — Ambulatory Visit (HOSPITAL_COMMUNITY)
Admission: RE | Admit: 2024-04-24 | Discharge: 2024-04-24 | Disposition: A | Discharge status: Home / Self Care | Source: Ambulatory Visit | Attending: Family Medicine | Admitting: Family Medicine

## 2024-04-24 ENCOUNTER — Ambulatory Visit: Payer: Self-pay | Admitting: Family Medicine

## 2024-04-24 ENCOUNTER — Encounter: Payer: Self-pay | Admitting: Family Medicine

## 2024-04-24 ENCOUNTER — Ambulatory Visit (INDEPENDENT_AMBULATORY_CARE_PROVIDER_SITE_OTHER): Admitting: Family Medicine

## 2024-04-24 VITALS — BP 137/82 | HR 85 | Temp 97.5°F | Ht 67.0 in | Wt 176.0 lb

## 2024-04-24 DIAGNOSIS — M25512 Pain in left shoulder: Secondary | ICD-10-CM | POA: Insufficient documentation

## 2024-04-24 DIAGNOSIS — I4819 Other persistent atrial fibrillation: Secondary | ICD-10-CM | POA: Diagnosis not present

## 2024-04-24 DIAGNOSIS — G8929 Other chronic pain: Secondary | ICD-10-CM | POA: Diagnosis present

## 2024-04-24 DIAGNOSIS — M25511 Pain in right shoulder: Secondary | ICD-10-CM | POA: Diagnosis not present

## 2024-04-24 DIAGNOSIS — E782 Mixed hyperlipidemia: Secondary | ICD-10-CM

## 2024-04-24 DIAGNOSIS — R7303 Prediabetes: Secondary | ICD-10-CM

## 2024-04-24 DIAGNOSIS — I10 Essential (primary) hypertension: Secondary | ICD-10-CM

## 2024-04-24 DIAGNOSIS — I4891 Unspecified atrial fibrillation: Secondary | ICD-10-CM | POA: Diagnosis not present

## 2024-04-24 DIAGNOSIS — Z7901 Long term (current) use of anticoagulants: Secondary | ICD-10-CM

## 2024-04-24 MED ORDER — BEMPEDOIC ACID 180 MG PO TABS: 180.0000 mg | ORAL_TABLET | Freq: Every day | ORAL | 3 refills | Status: AC

## 2024-04-24 MED ORDER — APIXABAN 5 MG PO TABS: 5.0000 mg | ORAL_TABLET | Freq: Two times a day (BID) | ORAL | 5 refills | Status: AC

## 2024-04-24 MED ORDER — ATENOLOL 25 MG PO TABS: 25.0000 mg | ORAL_TABLET | Freq: Every day | ORAL | 3 refills | Status: AC

## 2024-04-24 NOTE — Assessment & Plan Note (Signed)
 Decreased range of motion and painful range of motion.  X-rays for further evaluation.  Cannot use NSAIDs in the setting of Eliquis  use.

## 2024-04-24 NOTE — Assessment & Plan Note (Signed)
 BP stable Continue amlodipine  and atenolol 

## 2024-04-24 NOTE — Assessment & Plan Note (Signed)
 Stable. Continue Eliquis and atenolol.

## 2024-04-24 NOTE — Progress Notes (Signed)
 "  Subjective:  Patient ID: Joshua Alvarez, male    DOB: 1945/06/10  Age: 79 y.o. MRN: [id number]  CC:   Chief Complaint  Patient presents with   6 month follow up     8 of 10 pain in both shoulders , has worsened and interferes with dressing    HPI:  79 year old male presents for follow-up.  A-fib stable.  Doing well on atenolol  and Eliquis .  BP is fairly well-controlled given age.  He is compliant with amlodipine  and atenolol .  Patient had very good response to Lipitor Cacit.  Last LDL was 77.  He is statin intolerant.  Patient needs labs today.  Patient endorses ongoing bilateral shoulder pain.  He states that this has been going on for at least a year.  Has been worsening as of late.  He states that he has not told me about this previously and that he is now bringing it to my attention as it is worsening and causing difficulty.  He has difficulty with overhead activity.  He is now having difficulty putting his Cotolone and reaching behind his back as well.  Denies any weakness.  Patient Active Problem List   Diagnosis Date Noted   Chronic pain of both shoulders 04/24/2024   Atrial fibrillation (HCC) 08/17/2021   Prediabetes 07/18/2021   Basal cell carcinoma of skin of scalp and neck 03/22/2021   Statin intolerance 08/12/2020   Hyperlipidemia 11/19/2012   Essential hypertension, benign 11/19/2012   BPH (benign prostatic hyperplasia) 11/19/2012    Social Hx   Social History   Socioeconomic History   Marital status: Married    Spouse name: Ann   Number of children: 2   Years of education: Not on file   Highest education level: Associate degree: occupational, scientist, product/process development, or vocational program  Occupational History   Not on file  Tobacco Use   Smoking status: Former    Current packs/day: 0.00    Average packs/day: 0.3 packs/day for 11.7 years (3.0 ttl pk-yrs)    Types: Cigarettes    Start date: 04/1980    Quit date: 04/2010    Years since quitting: 14.0    Smokeless tobacco: Never  Vaping Use   Vaping status: Never Used  Substance and Sexual Activity   Alcohol use: Not Currently   Drug use: Never   Sexual activity: Not Currently    Birth control/protection: Abstinence  Other Topics Concern   Not on file  Social History Narrative   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   1 grandson and 1 granddaughter   2 step grandsons   Social Drivers of Health   Tobacco Use: Medium Risk (04/24/2024)   Patient History    Smoking Tobacco Use: Former    Smokeless Tobacco Use: Never    Passive Exposure: Not on file  Financial Resource Strain: Low Risk (04/09/2024)   Overall Financial Resource Strain (CARDIA)    Difficulty of Paying Living Expenses: Not hard at all  Food Insecurity: No Food Insecurity (04/09/2024)   Epic    Worried About Programme Researcher, Broadcasting/film/video in the Last Year: Never true    Ran Out of Food in the Last Year: Never true  Transportation Needs: No Transportation Needs (04/09/2024)   Epic    Lack of Transportation (Medical): No    Lack of Transportation (Non-Medical): No  Physical Activity: Insufficiently Active (04/09/2024)   Exercise Vital Sign    Days of Exercise per Week:  4 days    Minutes of Exercise per Session: 30 min  Stress: Stress Concern Present (04/09/2024)   [employer] of Occupational Health - Occupational Stress Questionnaire    Feeling of Stress: To some extent  Social Connections: Socially Integrated (04/09/2024)   Social Connection and Isolation Panel    Frequency of Communication with Friends and Family: Three times a week    Frequency of Social Gatherings with Friends and Family: More than three times a week    Attends Religious Services: More than 4 times per year    Active Member of Clubs or Organizations: Yes    Attends Banker Meetings: More than 4 times per year    Marital Status: Married  Depression (PHQ2-9): Low Risk (04/24/2024)   Depression (PHQ2-9)     PHQ-2 Score: 2  Alcohol Screen: Low Risk (04/17/2023)   Alcohol Screen    Last Alcohol Screening Score (AUDIT): 0  Housing: Low Risk (04/09/2024)   Epic    Unable to Pay for Housing in the Last Year: No    Number of Times Moved in the Last Year: 0    Homeless in the Last Year: No  Utilities: Not At Risk (04/11/2024)   Epic    Threatened with loss of utilities: No  Health Literacy: Adequate Health Literacy (04/11/2024)   B1300 Health Literacy    Frequency of need for help with medical instructions: Never    Review of Systems Per HPI  Objective:  BP 137/82   Pulse 85   Temp (!) 97.5 F (36.4 C)   Ht 5' 7 (1.702 m)   Wt 176 lb (79.8 kg)   SpO2 96%   BMI 27.57 kg/m      04/24/2024    8:29 AM 04/11/2024    1:07 PM 10/22/2023    9:04 AM  BP/Weight  Systolic BP 137 -- 132  Diastolic BP 82 -- 82  Wt. (Lbs) 176 178 178  BMI 27.57 kg/m2 27.88 kg/m2 27.88 kg/m2    Physical Exam Vitals and nursing note reviewed.  Constitutional:      Appearance: Normal appearance.  Eyes:     General:        Right eye: No discharge.        Left eye: No discharge.     Conjunctiva/sclera: Conjunctivae normal.  Cardiovascular:     Rate and Rhythm: Rhythm irregular.  Pulmonary:     Effort: Pulmonary effort is normal.     Breath sounds: Normal breath sounds. No wheezing or rales.  Musculoskeletal:     Comments: Shoulders (Bilateral): Inspection reveals no abnormalities, atrophy or asymmetry. Palpation is normal with no tenderness over AC joint or bicipital groove. Range of motion decreased in flexion.  Painful arc. Rotator cuff strength normal throughout. + Hawkin's tests, empty can.    Neurological:     Mental Status: He is alert.     Lab Results  Component Value Date   WBC 8.0 10/22/2023   HGB 16.2 10/22/2023   HCT 51.1 (H) 10/22/2023   PLT 231 10/22/2023   GLUCOSE 98 10/31/2023   CHOL 148 08/23/2023   TRIG 130 08/23/2023   HDL 48 08/23/2023   LDLCALC 77 08/23/2023   ALT 23  10/22/2023   AST 22 10/22/2023   NA 143 10/31/2023   K 5.2 10/31/2023   CL 103 10/31/2023   CREATININE 1.13 10/31/2023   BUN 13 10/31/2023   CO2 22 10/31/2023   PSA 3.9 07/27/2014   HGBA1C 5.9 (  H) 05/30/2021     Assessment & Plan:  Chronic pain of both shoulders Assessment & Plan: Decreased range of motion and painful range of motion.  X-rays for further evaluation.  Cannot use NSAIDs in the setting of Eliquis  use.  Orders: -     DG Shoulder Left -     DG Shoulder Right  Mixed hyperlipidemia Assessment & Plan: Lipid panel today.  Continue Nexletol .  Orders: -     Lipid panel  Essential hypertension, benign Assessment & Plan: BP stable.  Continue amlodipine  and atenolol .  Orders: -     CMP14+EGFR -     Atenolol ; Take 1 tablet (25 mg total) by mouth daily.  Dispense: 90 tablet; Refill: 3  Prediabetes -     Hemoglobin A1c  Chronic anticoagulation -     CBC  Atrial fibrillation, unspecified type (HCC) Assessment & Plan: Stable.  Continue Eliquis  and atenolol .  Orders: -     Bempedoic Acid ; Take 1 tablet (180 mg total) by mouth daily.  Dispense: 90 tablet; Refill: 3  Persistent atrial fibrillation (HCC) Assessment & Plan: Stable.  Continue Eliquis  and atenolol .  Orders: -     Atenolol ; Take 1 tablet (25 mg total) by mouth daily.  Dispense: 90 tablet; Refill: 3 -     Apixaban ; Take 1 tablet (5 mg total) by mouth 2 (two) times daily.  Dispense: 60 tablet; Refill: 5    Follow-up: 6 months  Cylas Falzone Bluford DO [facility] "

## 2024-04-24 NOTE — Assessment & Plan Note (Signed)
 Lipid panel today.  Continue Nexletol .

## 2024-04-24 NOTE — Patient Instructions (Signed)
 Labs and xray today.  We will reach out with results.  Follow up in 6 months.

## 2024-04-25 LAB — HEMOGLOBIN A1C
Est. average glucose Bld gHb Est-mCnc: 120 mg/dL
Hgb A1c MFr Bld: 5.8 % — ABNORMAL HIGH (ref 4.8–5.6)

## 2024-04-25 LAB — CBC
Hematocrit: 49.7 % (ref 37.5–51.0)
Hemoglobin: 16.1 g/dL (ref 13.0–17.7)
MCH: 27.3 pg (ref 26.6–33.0)
MCHC: 32.4 g/dL (ref 31.5–35.7)
MCV: 84 fL (ref 79–97)
Platelets: 256 x10E3/uL (ref 150–450)
RBC: 5.89 x10E6/uL — ABNORMAL HIGH (ref 4.14–5.80)
RDW: 13.2 % (ref 11.6–15.4)
WBC: 8.5 x10E3/uL (ref 3.4–10.8)

## 2024-04-25 LAB — LIPID PANEL
Chol/HDL Ratio: 5.4 ratio — ABNORMAL HIGH (ref 0.0–5.0)
Cholesterol, Total: 249 mg/dL — ABNORMAL HIGH (ref 100–199)
HDL: 46 mg/dL
LDL Chol Calc (NIH): 170 mg/dL — ABNORMAL HIGH (ref 0–99)
Triglycerides: 177 mg/dL — ABNORMAL HIGH (ref 0–149)
VLDL Cholesterol Cal: 33 mg/dL (ref 5–40)

## 2024-04-25 LAB — CMP14+EGFR
ALT: 12 IU/L (ref 0–44)
AST: 21 IU/L (ref 0–40)
Albumin: 4.2 g/dL (ref 3.8–4.8)
Alkaline Phosphatase: 115 IU/L (ref 47–123)
BUN/Creatinine Ratio: 13 (ref 10–24)
BUN: 16 mg/dL (ref 8–27)
Bilirubin Total: 0.6 mg/dL (ref 0.0–1.2)
CO2: 24 mmol/L (ref 20–29)
Calcium: 9.5 mg/dL (ref 8.6–10.2)
Chloride: 103 mmol/L (ref 96–106)
Creatinine, Ser: 1.26 mg/dL (ref 0.76–1.27)
Globulin, Total: 2.8 g/dL (ref 1.5–4.5)
Glucose: 95 mg/dL (ref 70–99)
Potassium: 5 mmol/L (ref 3.5–5.2)
Sodium: 142 mmol/L (ref 134–144)
Total Protein: 7 g/dL (ref 6.0–8.5)
eGFR: 59 mL/min/1.73 — ABNORMAL LOW

## 2024-04-28 ENCOUNTER — Other Ambulatory Visit: Payer: Self-pay | Admitting: Family Medicine

## 2024-04-28 ENCOUNTER — Encounter: Payer: Self-pay | Admitting: Family Medicine

## 2024-04-28 MED ORDER — REPATHA SURECLICK 140 MG/ML ~~LOC~~ SOAJ: 140.0000 mg | SUBCUTANEOUS | 2 refills | Status: AC

## 2024-05-12 ENCOUNTER — Encounter: Payer: Self-pay | Admitting: Family Medicine

## 2024-05-12 ENCOUNTER — Other Ambulatory Visit: Payer: Self-pay | Admitting: Family Medicine

## 2024-05-14 ENCOUNTER — Telehealth: Payer: Self-pay

## 2024-05-14 ENCOUNTER — Encounter: Payer: Self-pay | Admitting: Cardiology

## 2024-05-14 ENCOUNTER — Ambulatory Visit: Admitting: Cardiology

## 2024-05-14 ENCOUNTER — Telehealth: Payer: Self-pay | Admitting: Pharmacist

## 2024-05-14 VITALS — BP 120/76 | HR 56 | Ht 67.0 in | Wt 176.0 lb

## 2024-05-14 DIAGNOSIS — T466X5D Adverse effect of antihyperlipidemic and antiarteriosclerotic drugs, subsequent encounter: Secondary | ICD-10-CM

## 2024-05-14 DIAGNOSIS — I10 Essential (primary) hypertension: Secondary | ICD-10-CM

## 2024-05-14 DIAGNOSIS — I4819 Other persistent atrial fibrillation: Secondary | ICD-10-CM

## 2024-05-14 DIAGNOSIS — M791 Myalgia, unspecified site: Secondary | ICD-10-CM

## 2024-05-14 DIAGNOSIS — Z789 Other specified health status: Secondary | ICD-10-CM

## 2024-05-14 DIAGNOSIS — E782 Mixed hyperlipidemia: Secondary | ICD-10-CM

## 2024-05-14 DIAGNOSIS — T466X5A Adverse effect of antihyperlipidemic and antiarteriosclerotic drugs, initial encounter: Secondary | ICD-10-CM

## 2024-05-14 NOTE — Progress Notes (Signed)
 Care Guide Pharmacy Note  05/14/2024 Name: Joshua Alvarez MRN: [id number] DOB: 09-27-1945  Referred By: Cook, Jayce G, DO Reason for referral: Complex Care Management (Outreach to schedule with Pharm d )   Joshua Alvarez is a 79 y.o. year old male who is a primary care patient of Cook, Jayce G, DO.  Joshua Alvarez was referred to the pharmacist for assistance related to: HLD  Successful contact was made with the patient to discuss pharmacy services including being ready for the pharmacist to call at least 5 minutes before the scheduled appointment time and to have medication bottles and any blood pressure readings ready for review. The patient agreed to meet with the pharmacist via telephone visit on (date/time).06/02/2024  Jeoffrey Buffalo , RMA     [place]  [facility], [facility] Guide  Direct Dial: [phone]  Website: [email or web address]

## 2024-05-14 NOTE — Telephone Encounter (Signed)
 Patient needs to start Repatha .  Will place [id number] to Pharmacy to enroll in [facility].  Ensure G72 coded for statin myopathy.  Will continue to follow.  Theta Leaf Dattero Dorethia Jeanmarie, PharmD, BCACP, CPP Clinical Pharmacist, [facility]

## 2024-05-14 NOTE — Progress Notes (Signed)
"  ° ° °  Cardiology Office Note  Date: 05/14/2024   ID: Joshua Alvarez, DOB 10/28/1945, MRN [id number]  History of Present Illness: Joshua Alvarez is a 79 y.o. male last seen in June 2025.  He is here for a routine visit.  He does not report any palpitations or chest pain with typical activities, no dizziness or syncope.  We went over his medications.  He is in the process of being evaluated for assistance program to take Repatha .  Has statin intolerance and his most recent LDL was up to 170.  He does not report any spontaneous bleeding problems on Eliquis .  I reviewed his interval lab work.  Physical Exam: VS:  BP 120/76 (BP Location: Right Arm, Patient Position: Sitting, Cuff Size: Normal)   Pulse (!) 56   Ht 5' 7 (1.702 m)   Wt 176 lb (79.8 kg)   SpO2 99%   BMI 27.57 kg/m , BMI Body mass index is 27.57 kg/m.  Wt Readings from Last 3 Encounters:  05/14/24 176 lb (79.8 kg)  04/24/24 176 lb (79.8 kg)  04/11/24 178 lb (80.7 kg)    General: Patient appears comfortable at rest. HEENT: Conjunctiva and lids normal. Neck: Supple, no elevated JVP or carotid bruits. Lungs: Clear to auscultation, nonlabored breathing at rest. Cardiac: Irregularly irregular, no gallop. Extremities: No pitting edema.  ECG:  An ECG dated 09/24/2023 was personally reviewed today and demonstrated:  Rate controlled atrial fibrillation.  Labwork: 04/24/2024: ALT 12; AST 21; BUN 16; Creatinine, Ser 1.26; Hemoglobin 16.1; Platelets 256; Potassium 5.0; Sodium 142     Component Value Date/Time   CHOL 249 (H) 04/24/2024 1011   TRIG 177 (H) 04/24/2024 1011   HDL 46 04/24/2024 1011   CHOLHDL 5.4 (H) 04/24/2024 1011   CHOLHDL 3.7 12/23/2013 0809   VLDL 25 12/23/2013 0809   LDLCALC 170 (H) 04/24/2024 1011   Other Studies Reviewed Today:  No interval cardiac testing for review today.  Assessment and Plan:  1.  Permanent atrial fibrillation with CHA2DS2-VASc score of 4.  Managing with heart rate control  strategy, no palpitations and tolerating atenolol  25 mg daily along with Eliquis  5 mg twice daily.  I reviewed his most recent lab work.   2.  Primary hypertension.  No change to current regimen which includes Norvasc  5 mg daily.   3.  Mixed hyperlipidemia with statin myalgias.  LDL up to 170.  In the process of being evaluated for assistance program to take Repatha  as arranged by Dr. Bluford.  Disposition:  Follow up 6 months.  Signed, Jayson JUDITHANN Sierras, M.D., F.A.C.C. [facility] "

## 2024-05-14 NOTE — Patient Instructions (Signed)
 Medication Instructions:   Your physician recommends that you continue on your current medications as directed. Please refer to the Current Medication list given to you today.   Labwork: None today  Testing/Procedures: None today  Follow-Up: 6 months Dr.McDowell  Any Other Special Instructions Will Be Listed Below (If Applicable).  If you need a refill on your cardiac medications before your next appointment, please call your pharmacy.

## 2024-06-02 ENCOUNTER — Other Ambulatory Visit

## 2024-10-22 ENCOUNTER — Ambulatory Visit: Admitting: Family Medicine
